# Patient Record
Sex: Female | Born: 1965 | Race: Black or African American | Hispanic: Yes | Marital: Single | Smoking: Never smoker
Health system: Southern US, Community
[De-identification: ages and names within clinical notes are randomized; demographics above are authoritative.]

## PROBLEM LIST (undated history)

## (undated) DIAGNOSIS — I10 Essential (primary) hypertension: Secondary | ICD-10-CM

## (undated) DIAGNOSIS — J302 Other seasonal allergic rhinitis: Secondary | ICD-10-CM

## (undated) DIAGNOSIS — G894 Chronic pain syndrome: Secondary | ICD-10-CM

## (undated) DIAGNOSIS — E039 Hypothyroidism, unspecified: Secondary | ICD-10-CM

## (undated) DIAGNOSIS — Z9989 Dependence on other enabling machines and devices: Secondary | ICD-10-CM

## (undated) DIAGNOSIS — N760 Acute vaginitis: Secondary | ICD-10-CM

## (undated) DIAGNOSIS — G4730 Sleep apnea, unspecified: Secondary | ICD-10-CM

## (undated) DIAGNOSIS — J984 Other disorders of lung: Secondary | ICD-10-CM

## (undated) DIAGNOSIS — N2889 Other specified disorders of kidney and ureter: Secondary | ICD-10-CM

## (undated) DIAGNOSIS — E669 Obesity, unspecified: Secondary | ICD-10-CM

## (undated) DIAGNOSIS — D649 Anemia, unspecified: Secondary | ICD-10-CM

## (undated) DIAGNOSIS — F32A Depression, unspecified: Secondary | ICD-10-CM

## (undated) DIAGNOSIS — F329 Major depressive disorder, single episode, unspecified: Secondary | ICD-10-CM

## (undated) DIAGNOSIS — R400 Somnolence: Secondary | ICD-10-CM

## (undated) DIAGNOSIS — M797 Fibromyalgia: Secondary | ICD-10-CM

## (undated) DIAGNOSIS — K219 Gastro-esophageal reflux disease without esophagitis: Secondary | ICD-10-CM

## (undated) DIAGNOSIS — G589 Mononeuropathy, unspecified: Secondary | ICD-10-CM

## (undated) DIAGNOSIS — D573 Sickle-cell trait: Secondary | ICD-10-CM

## (undated) DIAGNOSIS — J45909 Unspecified asthma, uncomplicated: Secondary | ICD-10-CM

## (undated) DIAGNOSIS — F419 Anxiety disorder, unspecified: Secondary | ICD-10-CM

## (undated) DIAGNOSIS — I2510 Atherosclerotic heart disease of native coronary artery without angina pectoris: Secondary | ICD-10-CM

## (undated) DIAGNOSIS — I2089 Other forms of angina pectoris: Secondary | ICD-10-CM

## (undated) DIAGNOSIS — K529 Noninfective gastroenteritis and colitis, unspecified: Secondary | ICD-10-CM

## (undated) DIAGNOSIS — G4733 Obstructive sleep apnea (adult) (pediatric): Secondary | ICD-10-CM

## (undated) DIAGNOSIS — E119 Type 2 diabetes mellitus without complications: Secondary | ICD-10-CM

## (undated) DIAGNOSIS — F4310 Post-traumatic stress disorder, unspecified: Secondary | ICD-10-CM

## (undated) HISTORY — DX: Other seasonal allergic rhinitis: J30.2

## (undated) HISTORY — DX: Unspecified asthma, uncomplicated: J45.909

## (undated) HISTORY — PX: EYE SURGERY: SHX253

## (undated) HISTORY — DX: Other disorders of lung: J98.4

## (undated) HISTORY — DX: Acute vaginitis: N76.0

## (undated) HISTORY — DX: Other forms of angina pectoris: I20.89

## (undated) HISTORY — DX: Obstructive sleep apnea (adult) (pediatric): G47.33

## (undated) HISTORY — DX: Sickle-cell trait: D57.3

## (undated) HISTORY — PX: NO PAST SURGERIES: SHX2092

## (undated) HISTORY — DX: Chronic pain syndrome: G89.4

## (undated) HISTORY — DX: Type 2 diabetes mellitus without complications: E11.9

## (undated) HISTORY — DX: Mononeuropathy, unspecified: G58.9

## (undated) HISTORY — DX: Noninfective gastroenteritis and colitis, unspecified: K52.9

## (undated) HISTORY — DX: Other specified disorders of kidney and ureter: N28.89

## (undated) HISTORY — DX: Atherosclerotic heart disease of native coronary artery without angina pectoris: I25.10

## (undated) HISTORY — DX: Major depressive disorder, single episode, unspecified: F32.9

## (undated) HISTORY — DX: Dependence on other enabling machines and devices: Z99.89

## (undated) HISTORY — DX: Somnolence: R40.0

## (undated) HISTORY — PX: LOOP RECORDER IMPLANT: SHX5954

---
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
## 1986-03-19 HISTORY — PX: WISDOM TOOTH EXTRACTION: SHX21

## 2001-12-16 ENCOUNTER — Emergency Department (HOSPITAL_COMMUNITY): Admission: EM | Admit: 2001-12-16 | Discharge: 2001-12-17 | Payer: Self-pay | Admitting: *Deleted

## 2004-09-13 ENCOUNTER — Encounter: Admission: RE | Admit: 2004-09-13 | Discharge: 2004-09-13 | Payer: Self-pay | Admitting: Obstetrics & Gynecology

## 2004-09-14 ENCOUNTER — Ambulatory Visit (HOSPITAL_COMMUNITY): Admission: RE | Admit: 2004-09-14 | Discharge: 2004-09-14 | Payer: Self-pay | Admitting: Obstetrics & Gynecology

## 2007-07-07 ENCOUNTER — Ambulatory Visit (HOSPITAL_COMMUNITY): Admission: RE | Admit: 2007-07-07 | Discharge: 2007-07-07 | Payer: Self-pay | Admitting: Obstetrics & Gynecology

## 2007-08-12 ENCOUNTER — Inpatient Hospital Stay (HOSPITAL_COMMUNITY): Admission: RE | Admit: 2007-08-12 | Discharge: 2007-08-27 | Payer: Self-pay | Admitting: Psychiatry

## 2007-08-12 ENCOUNTER — Ambulatory Visit: Payer: Self-pay | Admitting: Psychiatry

## 2007-09-04 ENCOUNTER — Ambulatory Visit: Payer: Self-pay | Admitting: *Deleted

## 2007-12-02 ENCOUNTER — Emergency Department (HOSPITAL_COMMUNITY): Admission: EM | Admit: 2007-12-02 | Discharge: 2007-12-03 | Payer: Self-pay | Admitting: Emergency Medicine

## 2007-12-15 ENCOUNTER — Ambulatory Visit: Payer: Self-pay | Admitting: Internal Medicine

## 2008-01-01 ENCOUNTER — Ambulatory Visit: Payer: Self-pay | Admitting: Internal Medicine

## 2008-02-04 ENCOUNTER — Ambulatory Visit: Payer: Self-pay | Admitting: Internal Medicine

## 2008-03-18 ENCOUNTER — Ambulatory Visit: Payer: Self-pay | Admitting: Internal Medicine

## 2008-03-25 ENCOUNTER — Encounter: Admission: RE | Admit: 2008-03-25 | Discharge: 2008-04-19 | Payer: Self-pay | Admitting: Internal Medicine

## 2008-04-22 ENCOUNTER — Ambulatory Visit: Payer: Self-pay | Admitting: Internal Medicine

## 2008-05-18 ENCOUNTER — Ambulatory Visit: Payer: Self-pay | Admitting: *Deleted

## 2008-05-18 ENCOUNTER — Ambulatory Visit: Payer: Self-pay | Admitting: Family Medicine

## 2008-05-31 ENCOUNTER — Emergency Department (HOSPITAL_COMMUNITY): Admission: EM | Admit: 2008-05-31 | Discharge: 2008-05-31 | Payer: Self-pay | Admitting: Emergency Medicine

## 2008-06-29 ENCOUNTER — Emergency Department (HOSPITAL_COMMUNITY): Admission: EM | Admit: 2008-06-29 | Discharge: 2008-06-29 | Payer: Self-pay | Admitting: Emergency Medicine

## 2008-07-15 ENCOUNTER — Ambulatory Visit: Payer: Self-pay | Admitting: Internal Medicine

## 2008-07-22 ENCOUNTER — Ambulatory Visit: Payer: Self-pay | Admitting: Internal Medicine

## 2008-09-21 ENCOUNTER — Encounter: Admission: RE | Admit: 2008-09-21 | Discharge: 2008-12-08 | Payer: Self-pay | Admitting: Internal Medicine

## 2008-11-25 ENCOUNTER — Ambulatory Visit: Payer: Self-pay | Admitting: Internal Medicine

## 2008-11-25 LAB — CONVERTED CEMR LAB
ALT: 22 units/L (ref 0–35)
Alkaline Phosphatase: 80 units/L (ref 39–117)
Basophils Relative: 0 % (ref 0–1)
Calcium: 9.4 mg/dL (ref 8.4–10.5)
Chloride: 106 meq/L (ref 96–112)
Creatinine, Ser: 0.73 mg/dL (ref 0.40–1.20)
Glucose, Bld: 95 mg/dL (ref 70–99)
HCT: 35.9 % — ABNORMAL LOW (ref 36.0–46.0)
Hemoglobin: 11.7 g/dL — ABNORMAL LOW (ref 12.0–15.0)
Lymphocytes Relative: 38 % (ref 12–46)
MCHC: 32.6 g/dL (ref 30.0–36.0)
MCV: 83.5 fL (ref 78.0–100.0)
Neutro Abs: 3.2 10*3/uL (ref 1.7–7.7)
Neutrophils Relative %: 47 % (ref 43–77)
Potassium: 4.2 meq/L (ref 3.5–5.3)
RBC: 4.3 M/uL (ref 3.87–5.11)
RDW: 16.3 % — ABNORMAL HIGH (ref 11.5–15.5)
Sodium: 140 meq/L (ref 135–145)

## 2009-01-07 ENCOUNTER — Ambulatory Visit (HOSPITAL_COMMUNITY): Admission: RE | Admit: 2009-01-07 | Discharge: 2009-01-07 | Payer: Self-pay | Admitting: Internal Medicine

## 2009-01-12 ENCOUNTER — Ambulatory Visit: Payer: Self-pay | Admitting: Internal Medicine

## 2009-01-12 LAB — CONVERTED CEMR LAB
ALT: 26 units/L (ref 0–35)
Albumin: 4.1 g/dL (ref 3.5–5.2)
Alkaline Phosphatase: 96 units/L (ref 39–117)
BUN: 5 mg/dL — ABNORMAL LOW (ref 6–23)
Chloride: 105 meq/L (ref 96–112)
Free T4: 1.07 ng/dL (ref 0.80–1.80)
Glucose, Bld: 107 mg/dL — ABNORMAL HIGH (ref 70–99)
Total Bilirubin: 0.5 mg/dL (ref 0.3–1.2)
Total Protein: 7.7 g/dL (ref 6.0–8.3)

## 2009-01-27 ENCOUNTER — Ambulatory Visit: Payer: Self-pay | Admitting: Family Medicine

## 2009-02-22 ENCOUNTER — Ambulatory Visit: Payer: Self-pay | Admitting: Family Medicine

## 2009-05-02 ENCOUNTER — Ambulatory Visit: Payer: Self-pay | Admitting: Internal Medicine

## 2009-08-11 ENCOUNTER — Ambulatory Visit: Payer: Self-pay | Admitting: Internal Medicine

## 2010-02-01 ENCOUNTER — Encounter (INDEPENDENT_AMBULATORY_CARE_PROVIDER_SITE_OTHER): Payer: Self-pay | Admitting: Internal Medicine

## 2010-02-01 ENCOUNTER — Ambulatory Visit (HOSPITAL_COMMUNITY): Admission: RE | Admit: 2010-02-01 | Discharge: 2010-02-01 | Payer: Self-pay | Admitting: Internal Medicine

## 2010-02-01 LAB — CONVERTED CEMR LAB: Free T4: 0.98 ng/dL (ref 0.80–1.80)

## 2010-02-06 DIAGNOSIS — M545 Low back pain, unspecified: Secondary | ICD-10-CM | POA: Insufficient documentation

## 2010-05-20 ENCOUNTER — Inpatient Hospital Stay (INDEPENDENT_AMBULATORY_CARE_PROVIDER_SITE_OTHER)
Admission: RE | Admit: 2010-05-20 | Discharge: 2010-05-20 | Disposition: A | Discharge status: Home / Self Care | Payer: Medicare Other | Source: Ambulatory Visit | Attending: Family Medicine | Admitting: Family Medicine

## 2010-05-20 ENCOUNTER — Emergency Department (HOSPITAL_COMMUNITY)
Admission: EM | Admit: 2010-05-20 | Discharge: 2010-05-21 | Disposition: A | Discharge status: Home / Self Care | Payer: Medicare Other | Attending: Emergency Medicine | Admitting: Emergency Medicine

## 2010-05-20 DIAGNOSIS — R112 Nausea with vomiting, unspecified: Secondary | ICD-10-CM | POA: Insufficient documentation

## 2010-05-20 DIAGNOSIS — R509 Fever, unspecified: Secondary | ICD-10-CM | POA: Insufficient documentation

## 2010-05-20 DIAGNOSIS — G9332 Myalgic encephalomyelitis/chronic fatigue syndrome: Secondary | ICD-10-CM | POA: Insufficient documentation

## 2010-05-20 DIAGNOSIS — R5382 Chronic fatigue, unspecified: Secondary | ICD-10-CM | POA: Insufficient documentation

## 2010-05-20 DIAGNOSIS — G35 Multiple sclerosis: Secondary | ICD-10-CM | POA: Insufficient documentation

## 2010-05-20 DIAGNOSIS — E039 Hypothyroidism, unspecified: Secondary | ICD-10-CM | POA: Insufficient documentation

## 2010-05-20 DIAGNOSIS — E860 Dehydration: Secondary | ICD-10-CM | POA: Insufficient documentation

## 2010-05-20 DIAGNOSIS — Z79899 Other long term (current) drug therapy: Secondary | ICD-10-CM | POA: Insufficient documentation

## 2010-05-20 DIAGNOSIS — R42 Dizziness and giddiness: Secondary | ICD-10-CM

## 2010-05-20 DIAGNOSIS — I10 Essential (primary) hypertension: Secondary | ICD-10-CM

## 2010-05-20 DIAGNOSIS — K589 Irritable bowel syndrome without diarrhea: Secondary | ICD-10-CM | POA: Insufficient documentation

## 2010-05-20 DIAGNOSIS — R197 Diarrhea, unspecified: Secondary | ICD-10-CM | POA: Insufficient documentation

## 2010-05-20 LAB — URINALYSIS, ROUTINE W REFLEX MICROSCOPIC
Ketones, ur: >80 mg/dL — AB
Protein, ur: 30 mg/dL — AB
Specific Gravity, Urine: 1.021 (ref 1.005–1.030)

## 2010-05-20 LAB — DIFFERENTIAL
Eosinophils Absolute: 0 10*3/uL (ref 0.0–0.7)
Eosinophils Relative: 0 % (ref 0–5)
Lymphocytes Relative: 27 % (ref 12–46)
Lymphs Abs: 2.7 10*3/uL (ref 0.7–4.0)
Monocytes Absolute: 0.9 10*3/uL (ref 0.1–1.0)
Monocytes Relative: 9 % (ref 3–12)
Neutro Abs: 6.5 10*3/uL (ref 1.7–7.7)

## 2010-05-20 LAB — COMPREHENSIVE METABOLIC PANEL
ALT: 34 U/L (ref 0–35)
AST: 35 U/L (ref 0–37)
Albumin: 4.3 g/dL (ref 3.5–5.2)
Alkaline Phosphatase: 87 U/L (ref 39–117)
Calcium: 9.3 mg/dL (ref 8.4–10.5)
Creatinine, Ser: 0.74 mg/dL (ref 0.4–1.2)
GFR calc Af Amer: >60 mL/min (ref >60)
GFR calc non Af Amer: >60 mL/min (ref >60)
Glucose, Bld: 107 mg/dL — ABNORMAL HIGH (ref 70–99)
Total Protein: 8.9 g/dL — ABNORMAL HIGH (ref 6.0–8.3)

## 2010-05-20 LAB — CBC
HCT: 38.9 % (ref 36.0–46.0)
MCH: 27.1 pg (ref 26.0–34.0)

## 2010-05-20 LAB — URINE MICROSCOPIC-ADD ON

## 2010-05-20 LAB — LIPASE, BLOOD: Lipase: 19 U/L (ref 11–59)

## 2010-05-21 LAB — URINE CULTURE
Colony Count: >=100000
Culture  Setup Time: 201203032034

## 2010-06-29 LAB — POCT I-STAT, CHEM 8
Calcium, Ion: 1.2 mmol/L (ref 1.12–1.32)
Glucose, Bld: 96 mg/dL (ref 70–99)
HCT: 37 % (ref 36.0–46.0)
Potassium: 3.6 mEq/L (ref 3.5–5.1)
TCO2: 27 mmol/L (ref 0–100)
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
## 2010-11-23 DIAGNOSIS — E039 Hypothyroidism, unspecified: Secondary | ICD-10-CM | POA: Insufficient documentation

## 2010-12-13 LAB — URINALYSIS, ROUTINE W REFLEX MICROSCOPIC
Glucose, UA: NEGATIVE
Nitrite: NEGATIVE
Protein, ur: NEGATIVE
pH: 6.5

## 2010-12-13 LAB — COMPREHENSIVE METABOLIC PANEL
Albumin: 3.6
BUN: 7
CO2: 27
Calcium: 9.1
GFR calc non Af Amer: >60
Glucose, Bld: 91
Sodium: 140

## 2010-12-13 LAB — URINE MICROSCOPIC-ADD ON

## 2010-12-13 LAB — CBC
Hemoglobin: 11.3 — ABNORMAL LOW
RBC: 4.68
WBC: 7.1

## 2010-12-18 LAB — POCT I-STAT, CHEM 8
BUN: 7
Calcium, Ion: 1.17
Chloride: 104
Glucose, Bld: 96
HCT: 35 — ABNORMAL LOW
Potassium: 3.9

## 2010-12-18 LAB — URINALYSIS, ROUTINE W REFLEX MICROSCOPIC
Bilirubin Urine: NEGATIVE
Hgb urine dipstick: NEGATIVE
Nitrite: NEGATIVE
Protein, ur: NEGATIVE
Specific Gravity, Urine: 1.01
pH: 7.5

## 2010-12-18 LAB — DIFFERENTIAL
Basophils Absolute: 0.1
Basophils Relative: 1
Eosinophils Relative: 6 — ABNORMAL HIGH
Lymphocytes Relative: 28
Lymphs Abs: 2
Monocytes Relative: 13 — ABNORMAL HIGH
Neutro Abs: 3.8

## 2010-12-18 LAB — RAPID URINE DRUG SCREEN, HOSP PERFORMED
Cocaine: NOT DETECTED
Tetrahydrocannabinol: NOT DETECTED

## 2010-12-18 LAB — ETHANOL: Alcohol, Ethyl (B): <5

## 2010-12-18 LAB — CBC
HCT: 33.7 — ABNORMAL LOW
Hemoglobin: 11.2 — ABNORMAL LOW
RBC: 3.94
RDW: 21.7 — ABNORMAL HIGH
WBC: 7.2

## 2010-12-18 LAB — POCT PREGNANCY, URINE: Preg Test, Ur: NEGATIVE

## 2011-06-05 ENCOUNTER — Ambulatory Visit
Admission: RE | Admit: 2011-06-05 | Discharge: 2011-06-05 | Disposition: A | Discharge status: Home / Self Care | Payer: Medicare Other | Source: Ambulatory Visit | Attending: Family Medicine | Admitting: Family Medicine

## 2011-06-05 ENCOUNTER — Other Ambulatory Visit: Payer: Self-pay | Admitting: Family Medicine

## 2011-06-05 DIAGNOSIS — Z1231 Encounter for screening mammogram for malignant neoplasm of breast: Secondary | ICD-10-CM

## 2012-03-19 DIAGNOSIS — D573 Sickle-cell trait: Secondary | ICD-10-CM | POA: Insufficient documentation

## 2012-04-30 ENCOUNTER — Other Ambulatory Visit: Payer: Self-pay | Admitting: Physician Assistant

## 2012-04-30 DIAGNOSIS — Z1231 Encounter for screening mammogram for malignant neoplasm of breast: Secondary | ICD-10-CM

## 2012-06-04 ENCOUNTER — Encounter (HOSPITAL_COMMUNITY): Payer: Self-pay | Admitting: Emergency Medicine

## 2012-06-04 ENCOUNTER — Emergency Department (HOSPITAL_COMMUNITY)
Admission: EM | Admit: 2012-06-04 | Discharge: 2012-06-04 | Discharge status: Against Medical Advice | Payer: Medicare Other | Attending: Emergency Medicine | Admitting: Emergency Medicine

## 2012-06-04 DIAGNOSIS — I10 Essential (primary) hypertension: Secondary | ICD-10-CM | POA: Insufficient documentation

## 2012-06-04 HISTORY — DX: Anxiety disorder, unspecified: F41.9

## 2012-06-04 HISTORY — DX: Essential (primary) hypertension: I10

## 2012-06-04 HISTORY — DX: Fibromyalgia: M79.7

## 2012-06-04 HISTORY — DX: Depression, unspecified: F32.A

## 2012-06-04 HISTORY — DX: Hypothyroidism, unspecified: E03.9

## 2012-06-04 HISTORY — DX: Post-traumatic stress disorder, unspecified: F43.10

## 2012-06-04 HISTORY — DX: Major depressive disorder, single episode, unspecified: F32.9

## 2012-06-04 HISTORY — DX: Sleep apnea, unspecified: G47.30

## 2012-06-04 HISTORY — DX: Obesity, unspecified: E66.9

## 2012-06-04 HISTORY — DX: Anemia, unspecified: D64.9

## 2012-06-04 HISTORY — DX: Gastro-esophageal reflux disease without esophagitis: K21.9

## 2012-06-04 NOTE — ED Notes (Signed)
Pt states, "I am going to go home. If I need to come back I will." pt educated on the risks of leaving without evaluation, pt verbalizes understanding

## 2012-06-04 NOTE — ED Notes (Signed)
C/o high blood pressure (pt reports 285/180) at home and headache all day.  States she forgot to take any of her medication today (including htn meds) until her headache got worse around 6pm.

## 2012-06-05 ENCOUNTER — Ambulatory Visit
Admission: RE | Admit: 2012-06-05 | Discharge: 2012-06-05 | Disposition: A | Discharge status: Home / Self Care | Payer: Medicare Other | Source: Ambulatory Visit | Attending: Physician Assistant | Admitting: Physician Assistant

## 2012-06-05 DIAGNOSIS — Z1231 Encounter for screening mammogram for malignant neoplasm of breast: Secondary | ICD-10-CM

## 2012-06-14 ENCOUNTER — Encounter (HOSPITAL_COMMUNITY): Payer: Self-pay | Admitting: Emergency Medicine

## 2012-06-14 ENCOUNTER — Emergency Department (INDEPENDENT_AMBULATORY_CARE_PROVIDER_SITE_OTHER)
Admission: EM | Admit: 2012-06-14 | Discharge: 2012-06-14 | Disposition: A | Discharge status: Home / Self Care | Payer: Medicare Other | Source: Home / Self Care

## 2012-06-14 DIAGNOSIS — J020 Streptococcal pharyngitis: Secondary | ICD-10-CM

## 2012-06-14 MED ORDER — AMOXICILLIN-POT CLAVULANATE 875-125 MG PO TABS: 1.0000 | ORAL_TABLET | Freq: Two times a day (BID) | ORAL | Status: DC

## 2012-06-14 MED ORDER — BENZONATATE 100 MG PO CAPS: 100.0000 mg | ORAL_CAPSULE | Freq: Two times a day (BID) | ORAL | Status: DC | PRN

## 2012-06-14 NOTE — ED Notes (Signed)
Pt c/o sore throat since Thursday. Sinus pressure. Wet productive cough and fever.  Pt has tried resting and not talking with no relief. Pt denies any other symptoms.

## 2012-06-14 NOTE — ED Provider Notes (Signed)
History     CSN: [id number]  Arrival date & time 06/14/12  1325   First MD Initiated Contact with Patient 06/14/12 1357      Chief complaint: Sore throat and cough   HPI Patient is a 46 year old female coming in with a five-day history of sore throat, cough, postnasal drip, and low-grade fever. Patient states that it seems to be getting worse. Patient states that the worst part of the this sore throat. Patient describes the pain as more of a dull aching sensation with burning. Patient states that it is worse with swallowing. Patient has had positive sick contacts including strep. Patient states she has had a cough that is productive with greenish discharge. Patient does have a history of asthma and has significant comorbidities. Patient denies any shortness of breath. Past Medical History  Diagnosis Date  . Hypertension   . Obesity   . Depression   . Anxiety   . PTSD (post-traumatic stress disorder)   . Hypothyroidism   . Anemia   . Fibromyalgia   . Sleep apnea   . GERD (gastroesophageal reflux disease)     No past surgical history on file.  No family history on file.  History  Substance Use Topics  . Smoking status: Never Smoker   . Smokeless tobacco: Not on file  . Alcohol Use: No    OB History   Grav Para Term Preterm Abortions TAB SAB Ect Mult Living                  Review of Systems Patient denies shortness of breath but is using her inhaler, positive for low-grade fever, denies nausea vomiting or abdominal pain, denies dysuria Allergies  Review of patient's allergies indicates no known allergies.  Home Medications   Current Outpatient Rx  Name  Route  Sig  Dispense  Refill  . amoxicillin-clavulanate (AUGMENTIN) 875-125 MG per tablet   Oral   Take 1 tablet by mouth 2 (two) times daily.   20 tablet   0   . benzonatate (TESSALON PERLES) 100 MG capsule   Oral   Take 1 capsule (100 mg total) by mouth 2 (two) times daily as needed for cough.   20  capsule   0     BP 153/81  Pulse 115  Temp(Src) 100 F (37.8 C) (Oral)  Resp 20  SpO2 100%  LMP 04/02/2012  Physical Exam General: No apparent distress the patient does appear uncomfortable. Patient does talk with a hoarse voice HEENT: Pupils equal reactive to light and accommodation, extraocular movements intact, patient is positive for postnasal drip as well as posterior pharynx erythema with mild exudate. Patient does have positive anterior cervical lymphadenopathy. Tympanic membranes visualized bilaterally nonbulging on erythemic. Pulmonary: Clear to auscultation bilaterally, no wheezes, rales or rhonchi. Cardiovascular: Patient is mildly tachycardic regular rhythm with no murmur present. Abdomen: Bowel sounds positive nontender nondistended Skin: No rash noted. ED Course  Procedures   Labs Reviewed - No data to display No results found. Strep test negative  Assessment  1. Strep throat     Plan: Patient does appear to have strep throat but does also have a cough secondary to her postnasal drip. Due to the patient's comorbidities and the multitude of other medications she is on, as well as sick contacts I would like to treat patient for presumed strep throat. Patient was going to be given Augmentin, Tessalon Perles and given handouts affording signs and went to seek medical attention. The patient will  followup with her primary care provider next week.  MDM          Judi Saa, DO 06/14/12 1446

## 2012-06-17 NOTE — ED Provider Notes (Signed)
Medical screening examination/treatment/procedure(s) were performed by resident physician or non-physician practitioner and as supervising physician I was immediately available for consultation/collaboration.   Barkley Bruns MD.   Linna Hoff, MD 06/17/12 2051

## 2012-08-14 DIAGNOSIS — R911 Solitary pulmonary nodule: Secondary | ICD-10-CM | POA: Insufficient documentation

## 2012-12-25 DIAGNOSIS — M170 Bilateral primary osteoarthritis of knee: Secondary | ICD-10-CM | POA: Insufficient documentation

## 2013-01-07 ENCOUNTER — Encounter: Payer: Self-pay | Admitting: Neurology

## 2013-01-07 ENCOUNTER — Ambulatory Visit (INDEPENDENT_AMBULATORY_CARE_PROVIDER_SITE_OTHER): Payer: Medicare HMO | Admitting: Neurology

## 2013-01-07 VITALS — BP 134/85 | HR 92 | Temp 99.0°F | Ht 63.5 in | Wt 266.0 lb

## 2013-01-07 DIAGNOSIS — G4710 Hypersomnia, unspecified: Secondary | ICD-10-CM

## 2013-01-07 DIAGNOSIS — F330 Major depressive disorder, recurrent, mild: Secondary | ICD-10-CM | POA: Insufficient documentation

## 2013-01-07 DIAGNOSIS — IMO0001 Reserved for inherently not codable concepts without codable children: Secondary | ICD-10-CM

## 2013-01-07 DIAGNOSIS — R400 Somnolence: Secondary | ICD-10-CM | POA: Insufficient documentation

## 2013-01-07 DIAGNOSIS — F339 Major depressive disorder, recurrent, unspecified: Secondary | ICD-10-CM

## 2013-01-07 DIAGNOSIS — M797 Fibromyalgia: Secondary | ICD-10-CM

## 2013-01-07 DIAGNOSIS — G4733 Obstructive sleep apnea (adult) (pediatric): Secondary | ICD-10-CM | POA: Insufficient documentation

## 2013-01-07 DIAGNOSIS — F4310 Post-traumatic stress disorder, unspecified: Secondary | ICD-10-CM | POA: Insufficient documentation

## 2013-01-07 DIAGNOSIS — F329 Major depressive disorder, single episode, unspecified: Secondary | ICD-10-CM | POA: Insufficient documentation

## 2013-01-07 HISTORY — DX: Major depressive disorder, single episode, unspecified: F32.9

## 2013-01-07 HISTORY — DX: Obstructive sleep apnea (adult) (pediatric): G47.33

## 2013-01-07 HISTORY — DX: Somnolence: R40.0

## 2013-01-07 NOTE — Progress Notes (Signed)
Subjective:    Patient ID: Abigail Mendez is a 47 y.o. female.  HPI  Huston Foley, MD, PhD [facility] [address], [address] [address] [place], [place] [address]  Dear Dr. Cyndia Bent,   I saw your patient, informed to call for, upon your kind request in my neurologic clinic today for initial consultation of her sleep disorder, in particular her obstructive sleep apnea. The patient is unaccompanied today. As you know, [name] is a very pleasant 47 year old right-handed woman with an underlying medical history of hypertension, obesity, FMS with chronic pain, MDD, PTSD, asthma, sickle cell trait, chronic fatigue, IBS, OA, GERD, CTS, allergies, who was Dx with OSA in 2/14. I do not have her sleep study report, but will try to get it. She no longer sees Dr. Randolm Idol in sleep out of [facility], as her insurance coverage has changed on 10/1. Her DME company has changed as well. [facility] to [facility] with [facility].  She was placed on CPAP in 2/14 and I reviewed the patient's CPAP compliance data from 10/09/12 to 01/06/13, which is a total of 90 days, during which time the patient used CPAP every day except for 2 days. The average usage for all days was 8 hours and 6 minutes. The percent used days greater than 4 hours was 93%, indicating excellent compliance. The residual AHI was 0.4 per hour, indicating an appropriate treatment pressure of 12 cwp with EPR of 3. While she indicates sleeping better with CPAP, she still wakes up not well rested and feels tired during the day. She has an ESS of 16/24. She has tried improving her sleep hygiene. She had gained wt on Lyrica and stopped it 7 months ago. She also stopped her Wellbutrin, Zoloft, Abilify about 10 months ago on her own. She has been taking hydrocodone 2 pills every 8 hours and robaxin for FMS. She has not yet seen a pain specialist, but is in the process of getting a referral. She sees Dr. Corliss Skains in  rheumatology. She used to go to [facility], but stopped going there on 12/25/12.   Her typical bedtime is reported to be around 9s AM and usual wake time is around 5:30 AM. Sleep onset typically occurs within 60 minutes. She reports feeling marginally rested upon awakening. She wakes up on an average 4 times in the middle of the night and has to go to the bathroom 1 time on a typical night. She admits to occasional morning headaches. She has not fallen asleep while driving. The patient has not been taking a planned nap.  She has been known to snore for the past many years. There is no report of nighttime reflux, with frequent nighttime cough experienced. The patient has not noted any RLS symptoms and is not known to kick while asleep or before falling asleep. There is no family history of RLS or OSA.  She is a restless sleeper and in the morning, the bed is quite disheveled.   She denies cataplexy, sleep paralysis, hypnagogic or hypnopompic hallucinations, or sleep attacks. She does not report any vivid dreams, nightmares, dream enactments, or parasomnias, such as sleep talking or sleep walking. She consumes 0 caffeinated beverages per day. She does not smoke and does not drink EtOH. She has had suicidal thoughts in the past and goes to group therapy, but has not been there in a month.  Her bedroom is usually dark and cool. There is a TV in the  bedroom and usually it is not on at night.   Her Past Medical History Is Significant For: Past Medical History  Diagnosis Date  . Hypertension   . Obesity   . Depression   . Anxiety   . PTSD (post-traumatic stress disorder)   . Hypothyroidism   . Anemia   . Fibromyalgia   . Sleep apnea   . GERD (gastroesophageal reflux disease)     Her Past Surgical History Is Significant For: No past surgical history on file.  Her Family History Is Significant For: No family history on file.  Her Social History Is Significant For: History   Social  History  . Marital Status: Single    Spouse Name: N/A    Number of Children: N/A  . Years of Education: N/A   Social History Main Topics  . Smoking status: Never Smoker   . Smokeless tobacco: None  . Alcohol Use: No  . Drug Use: No  . Sexual Activity: No   Other Topics Concern  . None   Social History Narrative  . None    Her Allergies Are:  Allergies  Allergen Reactions  . Benztropine Nausea And Vomiting  . Cyclobenzaprine     Hallucinations  . Lactose Intolerance (Gi) Diarrhea  . Lamotrigine     'fall', balance problems  . Latex     Redness,swelling,itching, localized pain  . Lisinopril     cough  . Phenylalanine Rash  :   Her Current Medications Are:  Outpatient Encounter Prescriptions as of 01/07/2013  Medication Sig Dispense Refill  . acetaminophen (TYLENOL) 500 MG tablet 1,000 mg.      . albuterol (PROVENTIL HFA;VENTOLIN HFA) 108 (90 BASE) MCG/ACT inhaler Inhale 2 puffs into the lungs 4 (four) times daily.      Marland Kitchen AMLODIPINE BESYLATE PO Take by mouth.      . ARIPiprazole (ABILIFY PO) Take by mouth.      Marland Kitchen aspirin 81 MG tablet Take 81 mg by mouth daily.      Marland Kitchen b complex vitamins tablet Take 1 tablet by mouth daily.      Marland Kitchen buPROPion (WELLBUTRIN XL) 300 MG 24 hr tablet Take 300 mg by mouth daily.      . Cholecalciferol (VITAMIN D3) 1000 UNITS CAPS Take 2 tablets by mouth daily.      . cloNIDine (CATAPRES) 0.1 MG tablet Take 1 tablet by mouth 2 (two) times daily.      . ferrous sulfate 325 (65 FE) MG tablet Take 5 mg by mouth daily.      . fexofenadine (ALLEGRA) 180 MG tablet Take 180 mg by mouth daily.      . furosemide (LASIX) 20 MG tablet Take 1 tablet by mouth 2 (two) times daily.      . Garlic 2000 MG CAPS Take 5 tablets by mouth daily.      . Hydrocodone-Acetaminophen (VICODIN) 5-300 MG TABS Take 1 tablet by mouth every 8 (eight) hours as needed.      Marland Kitchen levothyroxine (SYNTHROID, LEVOTHROID) 50 MCG tablet Take 50 mcg by mouth daily.      . methocarbamol  (ROBAXIN) 500 MG tablet Take 500 mg by mouth 3 (three) times daily.      . mometasone-formoterol (DULERA) 200-5 MCG/ACT AERO Inhale 2 puffs into the lungs 2 (two) times daily.      . montelukast (SINGULAIR) 10 MG tablet Take 1 tablet by mouth daily.      . Multiple Vitamins-Minerals (THERA-M) TABS Take 1 tablet  by mouth daily.      Marland Kitchen omeprazole (PRILOSEC) 40 MG capsule Take 40 mg by mouth daily.      . valsartan (DIOVAN) 80 MG tablet Take 1 tablet by mouth daily.      . [DISCONTINUED] amLODipine (NORVASC) 10 MG tablet Take 10 mg by mouth daily.      . [DISCONTINUED] aspirin 81 MG tablet Take 81 mg by mouth daily.      . [DISCONTINUED] cloNIDine (CATAPRES) 0.1 MG tablet Take 1 tablet by mouth 2 (two) times daily.      . [DISCONTINUED] montelukast (SINGULAIR) 10 MG tablet Take 1 tablet by mouth daily.      . [DISCONTINUED] sertraline (ZOLOFT) 100 MG tablet Take 1 tablet by mouth 2 (two) times daily.      . [DISCONTINUED] ALBUTEROL SULFATE ER PO Take by mouth.      . [DISCONTINUED] amoxicillin-clavulanate (AUGMENTIN) 875-125 MG per tablet Take 1 tablet by mouth 2 (two) times daily.  20 tablet  0  . [DISCONTINUED] ARIPiprazole (ABILIFY) 5 MG tablet Take 15 mg by mouth daily.      . [DISCONTINUED] benzonatate (TESSALON PERLES) 100 MG capsule Take 1 capsule (100 mg total) by mouth 2 (two) times daily as needed for cough.  20 capsule  0  . [DISCONTINUED] BuPROPion HCl (WELLBUTRIN SR PO) Take by mouth.      . [DISCONTINUED] Fexofenadine HCl (ALLEGRA ODT PO) Take by mouth.      . [DISCONTINUED] Furosemide (LASIX PO) Take by mouth.      . [DISCONTINUED] Hydrocodone-Acetaminophen (VICODIN PO) Take by mouth.      . [DISCONTINUED] Levothyroxine Sodium 88 MCG CAPS Take by mouth.      . [DISCONTINUED] Methocarbamol (ROBAXIN PO) Take by mouth.      . [DISCONTINUED] Omeprazole (PRILOSEC PO) Take by mouth.      . [DISCONTINUED] Pregabalin (LYRICA PO) Take by mouth.      . [DISCONTINUED] Sertraline HCl (ZOLOFT  PO) Take by mouth.      . [DISCONTINUED] Valsartan (DIOVAN PO) Take by mouth.       No facility-administered encounter medications on file as of 01/07/2013.  :  Review of Systems:  Out of a complete 14 point review of systems, all are reviewed and negative with the exception of these symptoms as listed below:  Review of Systems  Constitutional: Positive for activity change, appetite change and fatigue.  HENT: Positive for hearing loss and trouble swallowing.   Eyes: Positive for pain.  Respiratory: Positive for shortness of breath and wheezing.        Snoring  Cardiovascular: Positive for chest pain.  Gastrointestinal: Negative.   Endocrine: Positive for heat intolerance.       Flushing  Genitourinary: Negative.   Musculoskeletal: Positive for arthralgias, joint swelling and myalgias.       Cramps  Skin: Negative.   Allergic/Immunologic: Positive for environmental allergies and immunocompromised state.  Neurological: Positive for dizziness, weakness, numbness and headaches.  Hematological:       Anemia  Psychiatric/Behavioral: Positive for suicidal ideas, sleep disturbance and dysphoric mood. The patient is nervous/anxious.     Objective:  Neurologic Exam  Physical Exam Physical Examination:   Filed Vitals:   01/07/13 0850  BP: 134/85  Pulse: 92  Temp: 99 F (37.2 C)    General Examination: The patient is a very pleasant 47 y.o. female in no acute distress. She appears well-developed and well-nourished and well groomed. She is obese.   HEENT:  Normocephalic, atraumatic, pupils are equal, round and reactive to light and accommodation. Funduscopic exam is normal with sharp disc margins noted. Extraocular tracking is good without limitation to gaze excursion or nystagmus noted. Normal smooth pursuit is noted. Hearing is grossly intact. Tympanic membranes are clear bilaterally. Face is symmetric with normal facial animation and normal facial sensation. Speech is clear with no  dysarthria noted. There is no hypophonia. There is no lip, neck/head, jaw or voice tremor. Neck is supple with full range of passive and active motion. There are no carotid bruits on auscultation. Oropharynx exam reveals: mild mouth dryness, adequate dental hygiene and moderate airway crowding, due to narrow airway, tonsils of 2+ and larger tongue. Mallampati is class III. Tongue protrudes centrally and palate elevates symmetrically. Neck size is 18 inches.   Chest: Clear to auscultation without wheezing, rhonchi or crackles noted.  Heart: S1+S2+0, regular and normal without murmurs, rubs or gallops noted.   Abdomen: Soft, non-tender and non-distended with normal bowel sounds appreciated on auscultation.  Extremities: There is 1+ pitting edema in the distal lower extremities bilaterally. Pedal pulses are intact.  Skin: Warm and dry without trophic changes noted. There are no varicose veins.  Musculoskeletal: exam reveals no obvious joint deformities, tenderness or joint swelling or erythema.   Neurologically:  Mental status: The patient is awake, alert and oriented in all 4 spheres. Her memory, attention, language and knowledge are appropriate. There is no aphasia, agnosia, apraxia or anomia. Speech is clear with normal prosody and enunciation. Thought process is linear. Mood is depressed and affect is blunted.  Cranial nerves are as described above under HEENT exam. In addition, shoulder shrug is normal with equal shoulder height noted. Motor exam: Normal bulk, strength and tone is noted. There is no drift, tremor or rebound. Romberg is negative. Reflexes are 2+ throughout. Toes are downgoing bilaterally. Fine motor skills are intact with normal finger taps, normal hand movements, normal rapid alternating patting, normal foot taps and normal foot agility.  Cerebellar testing shows no dysmetria or intention tremor on finger to nose testing. Heel to shin is unremarkable bilaterally. There is no  truncal or gait ataxia.  Sensory exam is intact to light touch, pinprick, vibration, temperature sense and proprioception in the upper and lower extremities.  Gait, station and balance are unremarkable. No veering to one side is noted. No leaning to one side is noted. Posture is age-appropriate and stance is narrow based. No problems turning are noted. She turns in 3 steps. Tandem walk is unremarkable. Intact toe and heel stance is noted.               Assessment and Plan:   In summary, QUANASIA DEFINO is a very pleasant 47 y.o.-year old female with a complex underlying medical history and a recent diagnosis of obstructive sleep apnea, now on CPAP. Her compliance data indicates that she is fully compliant with therapy. Unfortunately she has residual daytime symptoms including nonrestorative sleep and sleepiness. I do feel that her untreated mood disorder has a lot to do with her residual symptoms. Her compliance data indicates that she sleeps on an average 8 hours per night every night. She however indicates that she sleeps very poorly and there may be a component of sleep state misconception even. Unfortunately at this juncture I do not have her sleep study report available for review. We will try to get this today. Also, she indicates that her current DME company is no longer in network and that  she also got a letter from [facility] indicating that they would not be paying for the CPAP machine or supplies. I am not quite sure what this is all about. Nevertheless, at this juncture I have entered a new CPAP order for her so we can send it to a DME company that is in her network. She is going to call us back with that information and we will also try her best to find out what we need to do. In the worst case scenario we may have to repeat her sleep study and titration study were at least a split-night study. I advised her of this and she would be willing to go through with all of this even though she would be  reluctant to go through with another sleep study especially if she is well treated currently. I have strongly suggested that she get in touch with you about a referral to another psychiatry practice. She is strongly discouraged from stopping medications on her own. She stopped all her psychiatry medications several months ago. She has had suicidal ideations intermittently but no current plan or a history of carrying out any suicidal plans. She would like to stay with a psychiatrist locally to where she lives. In addition she is advised to followup with her rheumatologist and with you as scheduled. We talked about OSA, its prognosis and treatment options and in particular I advised her about the risks and ramifications of untreated OSA, in particular with respect to cardiovascular disease. She is congratulated on her excellent compliance with CPAP treatment and encouraged to continue with this. We also talked about trying to maintaining a healthy lifestyle in general. I encouraged the patient to eat healthy, exercise daily and keep well hydrated, to keep a scheduled bedtime and wake time routine, to not skip any meals and eat healthy snacks in between meals and to have protein with every meal. I stressed the importance of regular exercise.   I answered all her questions today and the patient was in agreement with the above outlined plan. I would like to see the patient back in 3 months, sooner if the need arises and encouraged her to call with any interim questions, concerns, problems or updates.    Most of my 50 minute visit today was spent in counseling and coordination of care, reviewing test results and reviewing medications.  Thank you very much for allowing me to participate in the care of this nice patient. If I can be of any further assistance to you please do not hesitate to call me at [phone].  Sincerely,   Huston Foley, MD, PhD

## 2013-01-07 NOTE — Patient Instructions (Signed)
Please continue using your CPAP regularly. While your insurance requires that you use CPAP at least 4 hours each night on 70% of the nights, I recommend, that you not skip any nights and use it throughout the night if you can. Getting used to CPAP does take time and patience and discipline. Untreated obstructive sleep apnea when it is moderate to severe can have an adverse impact on cardiovascular health and raise her risk for heart disease, arrhythmias, hypertension, congestive heart failure, stroke and diabetes. Untreated obstructive sleep apnea causes sleep disruption, nonrestorative sleep, and sleep deprivation. This can have an impact on your day to day functioning and cause daytime sleepiness and impairment of cognitive function, memory loss, mood disturbance, and problems focussing. Using CPAP regularly can improve these symptoms.  I have entered a new order for a CPAP machine.

## 2013-01-08 ENCOUNTER — Encounter: Payer: Self-pay | Admitting: Neurology

## 2013-01-14 ENCOUNTER — Ambulatory Visit (HOSPITAL_COMMUNITY): Payer: Medicare Other | Admitting: Psychiatry

## 2013-01-16 ENCOUNTER — Encounter: Payer: Self-pay | Admitting: Neurology

## 2013-01-16 ENCOUNTER — Ambulatory Visit (INDEPENDENT_AMBULATORY_CARE_PROVIDER_SITE_OTHER): Payer: Medicare HMO | Admitting: Licensed Clinical Social Worker

## 2013-01-16 DIAGNOSIS — F331 Major depressive disorder, recurrent, moderate: Secondary | ICD-10-CM

## 2013-01-16 DIAGNOSIS — F411 Generalized anxiety disorder: Secondary | ICD-10-CM

## 2013-01-21 ENCOUNTER — Telehealth: Payer: Self-pay | Admitting: Neurology

## 2013-01-21 NOTE — Telephone Encounter (Signed)
Patient called wanting her CPAP order to go to [place]. I will send order to [facility].

## 2013-01-22 ENCOUNTER — Ambulatory Visit (INDEPENDENT_AMBULATORY_CARE_PROVIDER_SITE_OTHER): Payer: Medicare HMO | Admitting: Licensed Clinical Social Worker

## 2013-01-22 DIAGNOSIS — F3189 Other bipolar disorder: Secondary | ICD-10-CM

## 2013-01-27 ENCOUNTER — Ambulatory Visit (INDEPENDENT_AMBULATORY_CARE_PROVIDER_SITE_OTHER): Payer: Medicare HMO | Admitting: Psychiatry

## 2013-01-27 VITALS — BP 124/64 | HR 73 | Ht 63.75 in | Wt 262.4 lb

## 2013-01-27 DIAGNOSIS — F329 Major depressive disorder, single episode, unspecified: Secondary | ICD-10-CM

## 2013-01-27 DIAGNOSIS — F332 Major depressive disorder, recurrent severe without psychotic features: Secondary | ICD-10-CM

## 2013-01-27 DIAGNOSIS — F411 Generalized anxiety disorder: Secondary | ICD-10-CM

## 2013-01-27 DIAGNOSIS — F4310 Post-traumatic stress disorder, unspecified: Secondary | ICD-10-CM
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
## 2013-01-29 ENCOUNTER — Ambulatory Visit (HOSPITAL_COMMUNITY): Payer: Medicare Other | Admitting: Psychiatry

## 2013-02-06 ENCOUNTER — Encounter: Payer: Self-pay | Admitting: Family

## 2013-02-06 ENCOUNTER — Ambulatory Visit (INDEPENDENT_AMBULATORY_CARE_PROVIDER_SITE_OTHER): Payer: Medicare HMO | Admitting: Family

## 2013-02-06 VITALS — BP 124/68 | HR 87 | Wt 263.0 lb

## 2013-02-06 DIAGNOSIS — D500 Iron deficiency anemia secondary to blood loss (chronic): Secondary | ICD-10-CM

## 2013-02-06 DIAGNOSIS — G4733 Obstructive sleep apnea (adult) (pediatric): Secondary | ICD-10-CM

## 2013-02-06 DIAGNOSIS — R5382 Chronic fatigue, unspecified: Secondary | ICD-10-CM

## 2013-02-06 DIAGNOSIS — K219 Gastro-esophageal reflux disease without esophagitis: Secondary | ICD-10-CM

## 2013-02-06 DIAGNOSIS — IMO0001 Reserved for inherently not codable concepts without codable children: Secondary | ICD-10-CM

## 2013-02-06 DIAGNOSIS — M1990 Unspecified osteoarthritis, unspecified site: Secondary | ICD-10-CM | POA: Insufficient documentation

## 2013-02-06 DIAGNOSIS — G4700 Insomnia, unspecified: Secondary | ICD-10-CM

## 2013-02-06 DIAGNOSIS — R5381 Other malaise: Secondary | ICD-10-CM

## 2013-02-06 DIAGNOSIS — I10 Essential (primary) hypertension: Secondary | ICD-10-CM

## 2013-02-06 DIAGNOSIS — M797 Fibromyalgia: Secondary | ICD-10-CM

## 2013-02-06 MED ORDER — FUROSEMIDE 20 MG PO TABS: 20.0000 mg | ORAL_TABLET | Freq: Two times a day (BID) | ORAL | Status: DC

## 2013-02-06 MED ORDER — OXYCODONE-ACETAMINOPHEN 5-325 MG PO TABS: 1.0000 | ORAL_TABLET | Freq: Three times a day (TID) | ORAL | Status: DC | PRN

## 2013-02-06 NOTE — Patient Instructions (Signed)
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
## 2013-02-06 NOTE — Progress Notes (Signed)
Pre visit review using our clinic review tool, if applicable. No additional management support is needed unless otherwise documented below in the visit note. 

## 2013-02-09 ENCOUNTER — Ambulatory Visit (INDEPENDENT_AMBULATORY_CARE_PROVIDER_SITE_OTHER): Payer: Medicare HMO | Admitting: Licensed Clinical Social Worker

## 2013-02-09 DIAGNOSIS — F411 Generalized anxiety disorder: Secondary | ICD-10-CM

## 2013-02-09 DIAGNOSIS — F331 Major depressive disorder, recurrent, moderate: Secondary | ICD-10-CM

## 2013-02-10 ENCOUNTER — Ambulatory Visit (INDEPENDENT_AMBULATORY_CARE_PROVIDER_SITE_OTHER): Payer: Medicare HMO | Admitting: Psychiatry

## 2013-02-10 DIAGNOSIS — F329 Major depressive disorder, single episode, unspecified: Secondary | ICD-10-CM

## 2013-02-10 DIAGNOSIS — F4310 Post-traumatic stress disorder, unspecified: Secondary | ICD-10-CM

## 2013-02-10 MED ORDER — VENLAFAXINE HCL 37.5 MG PO TABS: 75.0000 mg | ORAL_TABLET | Freq: Every morning | ORAL | Status: DC

## 2013-02-10 NOTE — Progress Notes (Signed)
[facility] [id number] Progress Note  Abigail Mendez [id number] 47 y.o.  02/10/2013 11:45 AM  Chief Complaint: "depression not getting better"  History of Present Illness:Patient is a 47 yo AAF with long history of depression who was started on Effexor at 37.5mg . She reports tolerating the Effexor well, but states it has not improved her mood. Continues to feel depressed, has poor appetite. She is on a different muscle relaxant, states her pain is still not under control , 10 percent better compared to her last visit. Sleep has improved, patient was started on Ambien by another doctor, states this has been helpful. Patient had labwork done recently reports it was normal for Hemoglobin and is being treated for hypothyroidism.   Suicidal Ideation: No Plan Formed: No Patient has means to carry out plan: No  Homicidal Ideation: No Plan Formed: No Patient has means to carry out plan: No  Review of Systems: Psychiatric: Agitation: No Hallucination: No Depressed Mood: Yes Insomnia: No Hypersomnia: No Altered Concentration: Yes Feels Worthless: Yes Grandiose Ideas: No Belief In Special Powers: No New/Increased Substance Abuse: No Compulsions: No  Neurologic: Headache: Yes Seizure: No Paresthesias: No  Past Medical Family, Social History: Multiple medical conditions. Never married, currently on disability and lives alone.   Outpatient Encounter Prescriptions as of 02/10/2013  Medication Sig  . acetaminophen (TYLENOL) 500 MG tablet 1,000 mg.  . albuterol (PROVENTIL HFA;VENTOLIN HFA) 108 (90 BASE) MCG/ACT inhaler Inhale 2 puffs into the lungs 4 (four) times daily.  Marland Kitchen AMLODIPINE BESYLATE PO Take by mouth.  . ARIPiprazole (ABILIFY PO) Take by mouth.  Marland Kitchen aspirin 81 MG tablet Take 81 mg by mouth daily.  Marland Kitchen b complex vitamins tablet Take 1 tablet by mouth daily.  . baclofen (LIORESAL) 10 MG tablet   . buPROPion (WELLBUTRIN XL) 300 MG 24 hr tablet Take 300 mg by mouth daily.   . Cholecalciferol (VITAMIN D3) 1000 UNITS CAPS Take 2 tablets by mouth daily.  . cloNIDine (CATAPRES) 0.1 MG tablet Take 1 tablet by mouth 2 (two) times daily.  . ferrous sulfate 325 (65 FE) MG tablet Take 5 mg by mouth daily.  . fexofenadine (ALLEGRA) 180 MG tablet Take 180 mg by mouth daily.  . furosemide (LASIX) 20 MG tablet Take 1 tablet (20 mg total) by mouth 2 (two) times daily.  . Garlic 2000 MG CAPS Take 5 tablets by mouth daily.  . Hydrocodone-Acetaminophen (VICODIN) 5-300 MG TABS Take 1 tablet by mouth every 8 (eight) hours as needed.  Marland Kitchen levothyroxine (SYNTHROID, LEVOTHROID) 50 MCG tablet Take 50 mcg by mouth daily.  . methocarbamol (ROBAXIN) 500 MG tablet Take 500 mg by mouth 3 (three) times daily.  . mometasone-formoterol (DULERA) 200-5 MCG/ACT AERO Inhale 2 puffs into the lungs 2 (two) times daily.  . montelukast (SINGULAIR) 10 MG tablet Take 1 tablet by mouth daily.  . Multiple Vitamins-Minerals (THERA-M) TABS Take 1 tablet by mouth daily.  Marland Kitchen omeprazole (PRILOSEC) 40 MG capsule Take 40 mg by mouth daily.  Marland Kitchen oxyCODONE-acetaminophen (ROXICET) 5-325 MG per tablet Take 1 tablet by mouth every 8 (eight) hours as needed for severe pain.  . valsartan (DIOVAN) 80 MG tablet Take 1 tablet by mouth daily.  Marland Kitchen venlafaxine (EFFEXOR) 37.5 MG tablet   . zolpidem (AMBIEN) 10 MG tablet     Past Psychiatric History/Hospitalization(s): Anxiety: Yes Bipolar Disorder: No Depression: Yes Mania: No Psychosis: No Schizophrenia: No Personality Disorder: No Hospitalization for psychiatric illness: Yes History of Electroconvulsive Shock Therapy: No Prior Suicide  Attempts: Yes  Physical Exam: Constitutional:  BP- 130/76 mm hg WT: 260.00 lbs Height: 5\' 3"   General Appearance: obese  Musculoskeletal: Strength & Muscle Tone: decreased Gait & Station: unsteady, walks with a walking stick Patient leans: N/A  Psychiatric: Speech (describe rate, volume, coherence, spontaneity, and  abnormalities if any): Normal rate, soft  Thought Process (describe rate, content, abstract reasoning, and computation): Normal, obsesses about her worthlessness  Associations: Coherent  Thoughts: normal  Mental Status: Orientation: oriented to person, place, time/date and situation Mood & Affect: depressed affect and anxiety Attention Span & Concentration: Normal  Medical Decision Making (Choose Three): Established Problem, Stable/Improving (1), Review of Last Therapy Session (1), Review of Medication Regimen & Side Effects (2) and Review of New Medication or Change in Dosage (2)  Assessment: Axis I: MDD, PTSD  Axis II: deferred  Axis III: Fibromyalgia, Chronic fatigue syndrome  Axis IV: multiple medical illnesses, lack of support, lives alone  Axis V: 70   Plan: Increase Effexor to 75mg , patient made aware of side effects and benefits. Recommend minimizing narcotic pain medications. Continue to follow up with therapy appointments. RTC in 6-8 weeks.  Patrick North, MD 02/10/2013

## 2013-02-20 ENCOUNTER — Ambulatory Visit (INDEPENDENT_AMBULATORY_CARE_PROVIDER_SITE_OTHER): Payer: Medicare HMO | Admitting: Licensed Clinical Social Worker

## 2013-02-20 DIAGNOSIS — F411 Generalized anxiety disorder: Secondary | ICD-10-CM

## 2013-02-20 DIAGNOSIS — F331 Major depressive disorder, recurrent, moderate: Secondary | ICD-10-CM

## 2013-02-24 ENCOUNTER — Telehealth: Payer: Self-pay | Admitting: Family

## 2013-02-24 NOTE — Telephone Encounter (Signed)
Patient Information:  Caller Name: Thai  Phone: [phone]  Patient: Abigail Mendez, Abigail Mendez  Gender: Female  DOB: [birth year]  Age: 47 Years  PCP: Adline Mango [facility])  Pregnant: No  Office Follow Up:  Does the office need to follow up with this patient?: Yes  Instructions For The Office: Pt asking if she can take ibuprofen with Percocet 5/325 1 PO Q 8 hrs prn pain.   Symptoms  Reason For Call & Symptoms: She is treated for Chronic Pain and taking Percocet 5/325 1 every 8 hrs prn.  She is being evaluated for a pain clinic, but in the meantime may she take Ibuprofen?  Please call pt back with advice.   Reviewed Health History In EMR: Yes  Reviewed Medications In EMR: Yes  Reviewed Allergies In EMR: Yes  Reviewed Surgeries / Procedures: Yes  Date of Onset of Symptoms: 02/24/2013  Treatments Tried: Percocet 5/325  Treatments Tried Worked: No OB / GYN:  LMP: Unknown  Guideline(s) Used:  No Protocol Available - Sick Adult  Disposition Per Guideline:   Discuss with PCP and Callback by Nurse Today  Reason For Disposition Reached:   Nursing judgment  Advice Given:  Call Back If:  New symptoms develop  You become worse.  Patient Will Follow Care Advice:  YES

## 2013-02-24 NOTE — Telephone Encounter (Signed)
Ok to take Ibuprofen?

## 2013-02-24 NOTE — Telephone Encounter (Signed)
Pt aware.

## 2013-02-24 NOTE — Telephone Encounter (Signed)
Please advise 

## 2013-02-26 ENCOUNTER — Ambulatory Visit (INDEPENDENT_AMBULATORY_CARE_PROVIDER_SITE_OTHER): Payer: Medicare HMO | Admitting: Family

## 2013-02-26 ENCOUNTER — Encounter: Payer: Self-pay | Admitting: Family

## 2013-02-26 VITALS — BP 98/58 | HR 103 | Wt 263.0 lb

## 2013-02-26 DIAGNOSIS — M797 Fibromyalgia: Secondary | ICD-10-CM

## 2013-02-26 DIAGNOSIS — M1990 Unspecified osteoarthritis, unspecified site: Secondary | ICD-10-CM

## 2013-02-26 DIAGNOSIS — IMO0001 Reserved for inherently not codable concepts without codable children: Secondary | ICD-10-CM

## 2013-02-26 MED ORDER — OXYCODONE-ACETAMINOPHEN 10-325 MG PO TABS: 1.0000 | ORAL_TABLET | Freq: Three times a day (TID) | ORAL | Status: DC | PRN

## 2013-02-26 NOTE — Patient Instructions (Signed)

## 2013-02-26 NOTE — Progress Notes (Signed)
Pre visit review using our clinic review tool, if applicable. No additional management support is needed unless otherwise documented below in the visit note. 

## 2013-02-26 NOTE — Progress Notes (Signed)
Subjective:    Patient ID: Abigail Mendez, female    DOB: Jan 19, 1966, 47 y.o.   MRN: [id number]  HPI 47 year old [place] American female, is in today with uncontrolled fibromyalgia and osteoarthritis. Her last office visit with which her hydrocodone to Percocet. She has an appointment with the pain clinic on 03/05/2013. However, she still in a cruciate in pain. Reports the pain is 9.5 of 10, worse with movement and at bedtime. She has a long-standing history of osteoarthritis, depression, fibromyalgia. She is requesting a temporary prescription but increased pain medication dosage until she sees the pain clinic next week.   Review of Systems  Respiratory: Negative.   Cardiovascular: Negative.   Gastrointestinal: Negative.   Endocrine: Negative.   Musculoskeletal: Positive for arthralgias, back pain and myalgias.  Skin: Negative.   Allergic/Immunologic: Negative.   Neurological: Negative.   Psychiatric/Behavioral: Negative.    Past Medical History  Diagnosis Date  . Hypertension   . Obesity   . Depression   . Anxiety   . PTSD (post-traumatic stress disorder)   . Hypothyroidism   . Anemia   . Fibromyalgia   . Sleep apnea   . GERD (gastroesophageal reflux disease)   . OSA on CPAP 01/07/2013  . MDD (major depressive disorder) 01/07/2013  . Daytime somnolence 01/07/2013    History   Social History  . Marital Status: Single    Spouse Name: N/A    Number of Children: N/A  . Years of Education: N/A   Occupational History  . Not on file.   Social History Main Topics  . Smoking status: Never Smoker   . Smokeless tobacco: Not on file  . Alcohol Use: No  . Drug Use: No  . Sexual Activity: No   Other Topics Concern  . Not on file   Social History Narrative  . No narrative on file    No past surgical history on file.  No family history on file.  Allergies  Allergen Reactions  . Benztropine Nausea And Vomiting  . Cyclobenzaprine     Hallucinations  . Lactose  Intolerance (Gi) Diarrhea  . Lamotrigine     'fall', balance problems  . Latex     Redness,swelling,itching, localized pain  . Lisinopril     cough  . Phenylalanine Rash    Current Outpatient Prescriptions on File Prior to Visit  Medication Sig Dispense Refill  . acetaminophen (TYLENOL) 500 MG tablet 1,000 mg.      . albuterol (PROVENTIL HFA;VENTOLIN HFA) 108 (90 BASE) MCG/ACT inhaler Inhale 2 puffs into the lungs 4 (four) times daily.      Marland Kitchen AMLODIPINE BESYLATE PO Take by mouth.      . ARIPiprazole (ABILIFY PO) Take by mouth.      Marland Kitchen aspirin 81 MG tablet Take 81 mg by mouth daily.      Marland Kitchen b complex vitamins tablet Take 1 tablet by mouth daily.      . baclofen (LIORESAL) 10 MG tablet       . buPROPion (WELLBUTRIN XL) 300 MG 24 hr tablet Take 300 mg by mouth daily.      . Cholecalciferol (VITAMIN D3) 1000 UNITS CAPS Take 2 tablets by mouth daily.      . cloNIDine (CATAPRES) 0.1 MG tablet Take 1 tablet by mouth 2 (two) times daily.      . ferrous sulfate 325 (65 FE) MG tablet Take 5 mg by mouth daily.      . fexofenadine (ALLEGRA) 180 MG tablet  Take 180 mg by mouth daily.      . furosemide (LASIX) 20 MG tablet Take 1 tablet (20 mg total) by mouth 2 (two) times daily.  60 tablet  4  . Garlic 2000 MG CAPS Take 5 tablets by mouth daily.      Marland Kitchen levothyroxine (SYNTHROID, LEVOTHROID) 50 MCG tablet Take 50 mcg by mouth daily.      . methocarbamol (ROBAXIN) 500 MG tablet Take 500 mg by mouth 3 (three) times daily.      . mometasone-formoterol (DULERA) 200-5 MCG/ACT AERO Inhale 2 puffs into the lungs 2 (two) times daily.      . montelukast (SINGULAIR) 10 MG tablet Take 1 tablet by mouth daily.      . Multiple Vitamins-Minerals (THERA-M) TABS Take 1 tablet by mouth daily.      Marland Kitchen omeprazole (PRILOSEC) 40 MG capsule Take 40 mg by mouth daily.      . valsartan (DIOVAN) 80 MG tablet Take 1 tablet by mouth daily.      Marland Kitchen venlafaxine (EFFEXOR) 37.5 MG tablet Take 2 tablets (75 mg total) by mouth every  morning.  30 tablet  1  . zolpidem (AMBIEN) 10 MG tablet        No current facility-administered medications on file prior to visit.    BP 98/58  Pulse 103  Wt 263 lb (119.296 kg)chart    Objective:   Physical Exam  Constitutional: She is oriented to person, place, and time. She appears well-developed and well-nourished.  Neck: Normal range of motion. Neck supple. No thyromegaly present.  Cardiovascular: Normal rate, regular rhythm and normal heart sounds.   Pulmonary/Chest: Effort normal and breath sounds normal.  Abdominal: Soft. Bowel sounds are normal.  Musculoskeletal: She exhibits tenderness. She exhibits no edema.  Tenderness to palpation of the lower back, and hip joints. Ambulating with a cane. No swelling to the joints.  Neurological: She is alert and oriented to person, place, and time.  Skin: Skin is warm and dry.  Psychiatric: She has a normal mood and affect.          Assessment & Plan:  Assessment: 1. Fibromyalgia 2. Osteoarthritis 3. Obesity  Plan: Increase Percocet to 12/20/2023 3 times a day as needed for pain. Followup in the pain clinic as scheduled. We will not manage any chronic pain here. Patient is aware and understands.

## 2013-02-27 ENCOUNTER — Ambulatory Visit (INDEPENDENT_AMBULATORY_CARE_PROVIDER_SITE_OTHER): Payer: Medicare HMO | Admitting: Licensed Clinical Social Worker

## 2013-02-27 DIAGNOSIS — F331 Major depressive disorder, recurrent, moderate: Secondary | ICD-10-CM

## 2013-02-27 DIAGNOSIS — F411 Generalized anxiety disorder: Secondary | ICD-10-CM

## 2013-03-04 ENCOUNTER — Encounter: Payer: Self-pay | Admitting: Family

## 2013-03-05 ENCOUNTER — Ambulatory Visit: Payer: Self-pay | Admitting: Pain Medicine

## 2013-03-05 ENCOUNTER — Telehealth: Payer: Self-pay | Admitting: Family

## 2013-03-05 DIAGNOSIS — M1990 Unspecified osteoarthritis, unspecified site: Secondary | ICD-10-CM

## 2013-03-05 DIAGNOSIS — M797 Fibromyalgia: Secondary | ICD-10-CM

## 2013-03-05 NOTE — Telephone Encounter (Signed)
Pt states she was given a temporary supply of  oxyCODONE-acetaminophen (PERCOCET) 10-325 MG per tablet until her appt with pain management today.  Pt states the pain management facility she was seen at today has referred her to [facility].  She is requesting a refill until she can get in with the new pain management facility.

## 2013-03-06 ENCOUNTER — Ambulatory Visit (INDEPENDENT_AMBULATORY_CARE_PROVIDER_SITE_OTHER): Payer: Medicare HMO | Admitting: Licensed Clinical Social Worker

## 2013-03-06 ENCOUNTER — Ambulatory Visit: Payer: Self-pay | Admitting: Family

## 2013-03-06 DIAGNOSIS — F411 Generalized anxiety disorder: Secondary | ICD-10-CM

## 2013-03-06 DIAGNOSIS — F331 Major depressive disorder, recurrent, moderate: Secondary | ICD-10-CM

## 2013-03-06 NOTE — Telephone Encounter (Signed)
Left message for pt to call back  °

## 2013-03-06 NOTE — Telephone Encounter (Signed)
Once she has been seen by the pain clinic we can not intervene. She can see if the pain clinic will write her a temporary supply.

## 2013-03-06 NOTE — Telephone Encounter (Signed)
Pt needs a referral to [facility] for pain management . [facility] can not see pt again due to history suicidal attempts. Pt needs another rx for oxycodone

## 2013-03-09 ENCOUNTER — Encounter: Payer: Self-pay | Admitting: Family

## 2013-03-09 NOTE — Telephone Encounter (Signed)
Left another message for pt to call back. Per Oran Rein, suggest that pt contact [facility] in [place], [place] at [phone], as they treat chronic pain

## 2013-03-09 NOTE — Telephone Encounter (Signed)
Spoke with pt. Pt was denied at [facility] and they suggested that she try [facility] or [facility] because they are not a Artist and are able to care for pts with sudicidal attempts.   Advised pt of Padonda's message and gave her the contact number.   Pt would like to know what she is suppose to do about her pain in the meantime because she is at a 9:10. Please advise

## 2013-03-09 NOTE — Telephone Encounter (Signed)
We cannot manage chronic pain. I apologize and empathize. Call and see if she can get in with [facility]

## 2013-03-10 NOTE — Telephone Encounter (Signed)
Pt aware of Padonda's note. She became upset and stated that she had called [facility] and was told that the only thing they could do for was to refer her to pain clinic because they do not treat chronic pain. I advised pt that I placed the referral for [facility] or [facility] and she should receive a call from one of them. She asked if she should find another primary care provider and I advised that she should do whatever she feels is best for her. However, Oran Rein will not be authorizing any refills of her pain medication.

## 2013-03-16 ENCOUNTER — Other Ambulatory Visit (HOSPITAL_COMMUNITY): Payer: Self-pay | Admitting: Psychiatry

## 2013-03-23 ENCOUNTER — Telehealth (HOSPITAL_COMMUNITY): Payer: Self-pay | Admitting: *Deleted

## 2013-03-23 NOTE — Telephone Encounter (Signed)
RN Magda Paganini with [employer] called stating patient needs a refill on Effexor. Refill not appropriate at this time, has appointment 03/24/13 notified Magda Paganini that refill will be filled at appointment. RN Magda Paganini states she will call patient to remind her.

## 2013-03-24 ENCOUNTER — Telehealth (HOSPITAL_COMMUNITY): Payer: Self-pay | Admitting: *Deleted

## 2013-03-24 ENCOUNTER — Ambulatory Visit (HOSPITAL_COMMUNITY): Payer: Self-pay | Admitting: Psychiatry

## 2013-03-24 NOTE — Telephone Encounter (Signed)
Pt left UG:[id number] not keep appt today with MD. Rescheduled for 04/23/13.States out of medicine for 2 week.Needs refill of Effexor.

## 2013-03-24 NOTE — Telephone Encounter (Signed)
Refill not needed at this time per pt

## 2013-03-25 ENCOUNTER — Telehealth (HOSPITAL_COMMUNITY): Payer: Self-pay | Admitting: *Deleted

## 2013-03-25 DIAGNOSIS — F329 Major depressive disorder, single episode, unspecified: Secondary | ICD-10-CM

## 2013-03-25 MED ORDER — VENLAFAXINE HCL 75 MG PO TABS
75.0000 mg | ORAL_TABLET | Freq: Every day | ORAL | Status: DC
Start: 2013-03-25 — End: 2013-06-16

## 2013-03-25 NOTE — Telephone Encounter (Signed)
Advised MD 1/7 that original Effexor for 37.5 mg, take 2/day, quantity #30, so pt ran out sooner than anticipated. MD ordered Effexor 75 mg daily, #30, with 1 refill

## 2013-03-27 ENCOUNTER — Ambulatory Visit: Payer: Commercial Managed Care - HMO | Admitting: Licensed Clinical Social Worker

## 2013-03-31 ENCOUNTER — Telehealth (HOSPITAL_COMMUNITY): Payer: Self-pay | Admitting: *Deleted

## 2013-03-31 NOTE — Telephone Encounter (Signed)
States stuck in hyperanxiety ever since restarting Effexor.Originally started Effexor at 37.5 for 1 week,then increased to 75mg .Then ran out.Was off for 1 week.Restarted at 75mg  last Wednesday 03/25/13.

## 2013-03-31 NOTE — Telephone Encounter (Signed)
Contacted pt per Dr.Ravi: Break Effexor in half and take 37.5mg  for 4 days until anxiety decreases.Then start back taking 75mg  daily.Pt verbalized understanding

## 2013-04-23 ENCOUNTER — Ambulatory Visit (HOSPITAL_COMMUNITY): Payer: Self-pay | Admitting: Psychiatry

## 2013-05-22 DIAGNOSIS — M7918 Myalgia, other site: Secondary | ICD-10-CM | POA: Insufficient documentation

## 2013-05-22 DIAGNOSIS — G8929 Other chronic pain: Secondary | ICD-10-CM | POA: Insufficient documentation

## 2013-05-26 ENCOUNTER — Telehealth (HOSPITAL_COMMUNITY): Payer: Self-pay | Admitting: *Deleted

## 2013-05-26 NOTE — Telephone Encounter (Signed)
Patient left VM: MD at pain clinic: Kellie Shropshire. Anjam.Contact number [phone] Will fill out Release of Information so that Dr. Einar Grad can talk with him. She has enough medicine to make it about a month at the current dose

## 2013-05-26 NOTE — Telephone Encounter (Signed)
Abigail Caper, MD at [facility], wants to use Effexor extended release for pain control and may require changes in dose.  Dr. Wess Botts told patient that they need her psychiatrist to prescribe the Effexor.

## 2013-05-27 ENCOUNTER — Encounter: Payer: Self-pay | Admitting: Neurology

## 2013-05-27 ENCOUNTER — Ambulatory Visit (INDEPENDENT_AMBULATORY_CARE_PROVIDER_SITE_OTHER): Payer: Commercial Managed Care - HMO | Admitting: Neurology

## 2013-05-27 VITALS — BP 128/82 | HR 76 | Temp 98.5°F | Ht 63.5 in | Wt 249.0 lb

## 2013-05-27 DIAGNOSIS — G4733 Obstructive sleep apnea (adult) (pediatric): Secondary | ICD-10-CM

## 2013-05-27 DIAGNOSIS — F4310 Post-traumatic stress disorder, unspecified: Secondary | ICD-10-CM

## 2013-05-27 DIAGNOSIS — G4700 Insomnia, unspecified: Secondary | ICD-10-CM

## 2013-05-27 DIAGNOSIS — Z9989 Dependence on other enabling machines and devices: Principal | ICD-10-CM

## 2013-05-27 DIAGNOSIS — M797 Fibromyalgia: Secondary | ICD-10-CM

## 2013-05-27 DIAGNOSIS — IMO0001 Reserved for inherently not codable concepts without codable children: Secondary | ICD-10-CM

## 2013-05-27 DIAGNOSIS — F329 Major depressive disorder, single episode, unspecified: Secondary | ICD-10-CM

## 2013-05-27 NOTE — Patient Instructions (Addendum)
Please remember to try to maintain good sleep hygiene, which means: Keep a regular sleep and wake schedule, try not to exercise or have a meal within 2 hours of your bedtime, try to keep your bedroom conducive for sleep, that is, cool and dark, without light distractors such as an illuminated alarm clock, and refrain from watching TV right before sleep or in the middle of the night and do not keep the TV or radio on during the night. Also, try not to use or play on electronic devices at bedtime, such as your cell phone, tablet PC or laptop. If you like to read at bedtime on an electronic device, try to dim the background light as much as possible. Do not eat in the middle of the night.   You can try Melatonin at night for sleep: take 5 to 10 mg one to 2 hours before your bedtime.

## 2013-05-27 NOTE — Progress Notes (Signed)
Subjective:    Patient ID: Abigail Mendez is a 48 y.o. female.  HPI    Interim history:   Abigail Mendez is a very pleasant 48 year old right-handed woman with an underlying complex medical history of hypertension, obesity, FMS with chronic pain, MDD, PTSD, asthma, sickle cell trait, chronic fatigue, IBS, OA, GERD, CTS, allergies, who presents for followup consultation of her OSA. She is on treatment with CPAP. She is unaccompanied today. I first met her on 01/07/2013 at the request of her primary care physician. She was diagnosed with OSA in 1/14. She reported a change in her insurance coverage and need to change her DME company as well as her sleep specialist. I reviewed her sleep study from 04/17/2012 which showed a sleep efficiency of 70.9%. Sleep latency was 32 minutes and REM latency was increased at 306.5 minutes. She had a decrease in rem sleep. She had a total AHI of 20 per hour. Her baseline oxygen saturation was 98% while awake. Oxygen nadir was 77%. Time below 88% saturation was 4.6% of the study. She had a CPAP titration study on 04/22/2012 and I reviewed the test results: She was noted to have a sleep efficiency of 88.4% with a latency to sleep of 25 minutes and REM latency of 77.6 minutes. She had an increased percentage of REM sleep. Baseline oxygen saturation while awake was 98%, lowest asleep oxygen saturation was 80%. Time below 88% saturation was 4.6% of the study. Her overall AHI was reduced at 3.2 per hour. She had a mild increase in periodic leg movements at 7.6 per hour, with an associated arousal index of 0 per hour. She was titrated on CPAP from 5-12 cm.  She was placed on CPAP in 2/14 and I reviewed the patient's CPAP compliance data from 10/09/12 to 01/06/13 (90 days), during which time the patient used CPAP every day except for 2 days. The average usage for all days was 8 hours and 6 minutes. The percent used days greater than 4 hours was 93%, indicating excellent compliance. The  residual AHI was 0.4 per hour, indicating an appropriate treatment pressure of 12 cwp with EPR of 3.   At the time of her first appointment with me she indicated full compliance with CPAP but still had residual issues with nonrestorative sleep and daytime tiredness. Her ESS was 16/24. She has tried improving her sleep hygiene. She had gained wt on Lyrica and stopped it. She also stopped her Wellbutrin, Zoloft, and Abilify on her own. She has been taking hydrocodone and robaxin for FMS. She has not yet seen a pain specialist, but is in the process of getting a referral. She sees Dr. Estanislado Pandy in rheumatology. She used to go to [facility], but stopped going there on 12/25/12. I suggested that she continue using CPAP regularly. I also talked to her about potentially retesting her.  Today, I reviewed her compliance data from 04/21/2013 through 05/26/2013 which is a total of 36 days during which time she is CPAP every night except for one night. Percent used days greater than 4 hours was 86%, indicating very good compliance with an average usage of 6 hours and 28 minutes. Residual AHI is 1.7 per hour with very low leak. Pressure is 12 cm with EPR of 2. Today, she reports, that she often is on CPAP, but not asleep. She feels, she sleeps about 4 hours at max. She has tried Ambien, which did not help her stay asleep, she tried Melatonin 4.5 mg, which  does not help. She is on Clonidine for BP, anxiety and nightmares. She is on Baclofen in lieu of Robaxin and is on Effexor XR 150 mg, which started in November 2014. She was re-started on Abilify 10 mg qHS. She has started seeing a pain Physician, Dr. Wess Botts, in [place].  She has been on Seroquel in the past, but gained a lot of weight. Her total sleep time of 6 hours is misleading she feels. Often she is awake with CPAP on. She will sleep during the day for about half an hour or so and uses her CPAP at the time.  Her Past Medical History Is Significant  For: Past Medical History  Diagnosis Date  . Hypertension   . Obesity   . Depression   . Anxiety   . PTSD (post-traumatic stress disorder)   . Hypothyroidism   . Anemia   . Fibromyalgia   . Sleep apnea   . GERD (gastroesophageal reflux disease)   . OSA on CPAP 01/07/2013  . MDD (major depressive disorder) 01/07/2013  . Daytime somnolence 01/07/2013    Her Past Surgical History Is Significant For: No past surgical history on file.  Her Family History Is Significant For: No family history on file.  Her Social History Is Significant For: History   Social History  . Marital Status: Single    Spouse Name: N/A    Number of Children: N/A  . Years of Education: N/A   Social History Main Topics  . Smoking status: Never Smoker   . Smokeless tobacco: None  . Alcohol Use: No  . Drug Use: No  . Sexual Activity: No   Other Topics Concern  . None   Social History Narrative  . None    Her Allergies Are:  Allergies  Allergen Reactions  . Benztropine Nausea And Vomiting  . Cyclobenzaprine     Hallucinations  . Lactose Intolerance (Gi) Diarrhea  . Lamotrigine     'fall', balance problems  . Latex     Redness,swelling,itching, localized pain  . Lisinopril     cough  . Phenylalanine Rash  :   Her Current Medications Are:  Outpatient Encounter Prescriptions as of 05/27/2013  Medication Sig  . acetaminophen (TYLENOL) 500 MG tablet 1,000 mg.  . albuterol (PROVENTIL HFA;VENTOLIN HFA) 108 (90 BASE) MCG/ACT inhaler Inhale 2 puffs into the lungs 4 (four) times daily.  Marland Kitchen AMLODIPINE BESYLATE PO Take by mouth.  . ARIPiprazole (ABILIFY PO) Take by mouth.  Marland Kitchen aspirin 81 MG tablet Take 81 mg by mouth daily.  Marland Kitchen b complex vitamins tablet Take 1 tablet by mouth daily.  . baclofen (LIORESAL) 10 MG tablet Take 10 mg by mouth 3 (three) times daily.   . Cholecalciferol (VITAMIN D3) 1000 UNITS CAPS Take 2 tablets by mouth daily.  . cloNIDine (CATAPRES) 0.1 MG tablet Take 1 tablet by  mouth 2 (two) times daily.  . fexofenadine (ALLEGRA) 180 MG tablet Take 180 mg by mouth daily.  . furosemide (LASIX) 20 MG tablet Take 1 tablet (20 mg total) by mouth 2 (two) times daily.  . Garlic 0370 MG CAPS Take 5 tablets by mouth daily.  Marland Kitchen guaifenesin (HUMIBID E) 400 MG TABS tablet Take 400 mg by mouth.  . Lactobacillus (PROBIOTIC ACIDOPHILUS) TABS Take 2 tablets by mouth daily.  Marland Kitchen levothyroxine (SYNTHROID, LEVOTHROID) 50 MCG tablet Take 50 mcg by mouth daily.  . Melatonin 1 MG SUBL Place 1 strip under the tongue at bedtime.  . mometasone-formoterol (DULERA) 200-5  MCG/ACT AERO Inhale 2 puffs into the lungs 2 (two) times daily.  . montelukast (SINGULAIR) 10 MG tablet Take 1 tablet by mouth daily.  . Multiple Vitamins-Minerals (THERA-M) TABS Take 1 tablet by mouth daily.  Marland Kitchen omeprazole (PRILOSEC) 40 MG capsule Take 40 mg by mouth daily.  . valsartan (DIOVAN) 80 MG tablet Take 1 tablet by mouth daily.  Marland Kitchen venlafaxine (EFFEXOR) 75 MG tablet Take 1 tablet (75 mg total) by mouth daily.  Marland Kitchen zolpidem (AMBIEN) 10 MG tablet   . [DISCONTINUED] buPROPion (WELLBUTRIN XL) 300 MG 24 hr tablet Take 300 mg by mouth daily.  . [DISCONTINUED] ferrous sulfate 325 (65 FE) MG tablet Take 5 mg by mouth daily.  . [DISCONTINUED] methocarbamol (ROBAXIN) 500 MG tablet Take 500 mg by mouth 3 (three) times daily.  . [DISCONTINUED] oxyCODONE-acetaminophen (PERCOCET) 10-325 MG per tablet Take 1 tablet by mouth every 8 (eight) hours as needed for pain.  :  Review of Systems:  Out of a complete 14 point review of systems, all are reviewed and negative with the exception of these symptoms as listed below:   Review of Systems  Constitutional: Positive for diaphoresis, appetite change and fatigue.  HENT: Positive for rhinorrhea and trouble swallowing.   Eyes: Positive for discharge.  Respiratory: Positive for cough, chest tightness, shortness of breath and wheezing.   Cardiovascular: Positive for palpitations.   Gastrointestinal: Positive for vomiting and constipation.  Endocrine:       Flushing  Genitourinary: Negative.   Musculoskeletal: Positive for arthralgias, back pain, gait problem, joint swelling, myalgias and neck pain.  Skin: Negative.   Allergic/Immunologic: Positive for environmental allergies and food allergies.  Neurological: Positive for dizziness, weakness and numbness.  Hematological:       Anemia  Psychiatric/Behavioral: Positive for sleep disturbance (insomnia, apnea, frequent waking, eds, snoring) and dysphoric mood. The patient is nervous/anxious.     Objective:  Neurologic Exam  Physical Exam Physical Examination:   Filed Vitals:   05/27/13 1134  BP: 128/82  Pulse: 76  Temp: 98.5 F (36.9 C)   General Examination: The patient is a very pleasant 48 y.o. female in no acute distress. She appears well-developed and well-nourished and well groomed. She is obese.   HEENT: Normocephalic, atraumatic, pupils are equal, round and reactive to light and accommodation. Funduscopic exam is normal with sharp disc margins noted. Extraocular tracking is good without limitation to gaze excursion or nystagmus noted. Normal smooth pursuit is noted. Hearing is grossly intact. Tympanic membranes are clear bilaterally. Face is symmetric with normal facial animation and normal facial sensation. Speech is clear with no dysarthria noted. There is no hypophonia. There is no lip, neck/head, jaw or voice tremor. Neck is supple with full range of passive and active motion. There are no carotid bruits on auscultation. Oropharynx exam reveals: mild mouth dryness, adequate dental hygiene and moderate airway crowding, due to narrow airway, tonsils of 2+ and larger tongue. Mallampati is class III. Tongue protrudes centrally and palate elevates symmetrically. Neck size is 18 inches.   Chest: Clear to auscultation without wheezing, rhonchi or crackles noted.  Heart: S1+S2+0, regular and normal without  murmurs, rubs or gallops noted.   Abdomen: Soft, non-tender and non-distended with normal bowel sounds appreciated on auscultation.  Extremities: There is 1+ pitting edema in the distal lower extremities bilaterally. Pedal pulses are intact.  Skin: Warm and dry without trophic changes noted. There are no varicose veins.  Musculoskeletal: exam reveals no obvious joint deformities, tenderness or joint  swelling or erythema.   Neurologically:  Mental status: The patient is awake, alert and oriented in all 4 spheres. Her memory, attention, language and knowledge are appropriate. There is no aphasia, agnosia, apraxia or anomia. Speech is clear with normal prosody and enunciation. Thought process is linear. Mood is depressed and affect is blunted.  Cranial nerves are as described above under HEENT exam. In addition, shoulder shrug is normal with equal shoulder height noted. Motor exam: Normal bulk, strength and tone is noted. There is no drift, tremor or rebound. Romberg is negative. Reflexes are 2+ throughout. Toes are downgoing bilaterally. Fine motor skills are intact with normal finger taps, normal hand movements, normal rapid alternating patting, normal foot taps and normal foot agility.  Cerebellar testing shows no dysmetria or intention tremor on finger to nose testing. Heel to shin is unremarkable bilaterally. There is no truncal or gait ataxia.  Sensory exam is intact to light touch, pinprick, vibration, temperature sense in the upper and lower extremities.  Gait, station and balance: She stands up slowly. She walks with a cane for better balance. No veering to one side is noted. No leaning to one side is noted. Posture is age-appropriate and stance is narrow based. No problems turning are noted.  Assessment and Plan:   In summary, TEMECA SOMMA is a very pleasant 48 year old female with a complex underlying medical history and a recent diagnosis of obstructive sleep apnea, now on CPAP. Her  compliance data indicates that she is fully compliant with therapy. Unfortunately she has residual daytime symptoms including non-restorative sleep, sleep disruption and problems achieving and maintaining sleep. This has been a chronic problem. Again, I do feel that her underlying mood disorder and chronic pain play a role in her sleep related symptoms. She is encouraged to continue to use CPAP regularly and is congratulated on her great compliance. I do not think we need to repeat sleep testing at this point. She has tried to adhere to a schedule for her bedtime and rise time in the mornings. She has no caffeine intake and does not drink alcohol. She tries to good sleep hygiene. She has been tried on multiple medications for her sleep including Seroquel and unfortunately at this point I do not think additional prescription sleep aids are necessary or safe. She is on multiple psychotropic medications. I have asked her to try a higher dose of melatonin, up to 10 mg and encouraged her to take it 1-2 hours before her scheduled bedtime which is currently at 10 PM.  We also talked about trying to maintaining a healthy lifestyle in general. I encouraged the patient to eat healthy, exercise daily and keep well hydrated, to keep a scheduled bedtime and wake time routine, to not skip any meals and eat healthy snacks in between meals and to have protein with every meal. I stressed the importance of regular exercise. She has been able to lose some weight.  I answered all her questions today and the patient was in agreement with the above outlined plan. I would like to see the patient back in 6 months, sooner if the need arises and encouraged her to call with any interim questions, concerns, problems or updates.

## 2013-05-28 ENCOUNTER — Encounter: Payer: Self-pay | Admitting: Neurology

## 2013-06-16 ENCOUNTER — Ambulatory Visit (INDEPENDENT_AMBULATORY_CARE_PROVIDER_SITE_OTHER): Payer: Medicare HMO | Admitting: Psychiatry

## 2013-06-16 VITALS — BP 120/84 | HR 92 | Ht 63.0 in | Wt 252.6 lb

## 2013-06-16 DIAGNOSIS — F4310 Post-traumatic stress disorder, unspecified: Secondary | ICD-10-CM

## 2013-06-16 DIAGNOSIS — F329 Major depressive disorder, single episode, unspecified: Secondary | ICD-10-CM

## 2013-06-16 MED ORDER — VENLAFAXINE HCL ER 150 MG PO CP24: 150.0000 mg | ORAL_CAPSULE | Freq: Every day | ORAL | Status: DC

## 2013-06-16 NOTE — Progress Notes (Signed)
Patient ID: Abigail Mendez, female   DOB: 16-Apr-1965, 48 y.o.   MRN: [id number]  [facility] [id number] Progress Note  Abigail Mendez [id number] 48 y.o.  06/16/2013 12:44 PM  Chief Complaint: "depression"  History of Present Illness:Patient is a 48 yo AAF with long history of depression who was seen for a follow up. At her last appointment, the Effexor was increased to 75mg  . Patient reports this has improved her mood significantly. She has seen a pain doctor this month, Dr.Kamal who increased the dose of Effexor to 150mg  to help with pain. Patient reports this dosage has not yet helped with her pain. Sleep has improved, patient was started on Ambien by another doctor, states this has been helpful to some extent. She has restarted water aerobics.     Suicidal Ideation: No Plan Formed: No Patient has means to carry out plan: No  Homicidal Ideation: No Plan Formed: No Patient has means to carry out plan: No  Review of Systems: Psychiatric: Agitation: No Hallucination: No Depressed Mood: Yes Insomnia: No Hypersomnia: No Altered Concentration: Yes Feels Worthless: Yes Grandiose Ideas: No Belief In Special Powers: No New/Increased Substance Abuse: No Compulsions: No  Neurologic: Headache: Yes Seizure: No Paresthesias: No  Past Medical Family, Social History: Multiple medical conditions. Never married, currently on disability and lives alone.   Outpatient Encounter Prescriptions as of 06/16/2013  Medication Sig  . acetaminophen (TYLENOL) 500 MG tablet 1,000 mg.  . albuterol (PROVENTIL HFA;VENTOLIN HFA) 108 (90 BASE) MCG/ACT inhaler Inhale 2 puffs into the lungs 4 (four) times daily.  Marland Kitchen AMLODIPINE BESYLATE PO Take by mouth.  . ARIPiprazole (ABILIFY PO) Take by mouth.  Marland Kitchen aspirin 81 MG tablet Take 81 mg by mouth daily.  Marland Kitchen b complex vitamins tablet Take 1 tablet by mouth daily.  . baclofen (LIORESAL) 10 MG tablet Take 10 mg by mouth 3 (three) times daily.   .  Cholecalciferol (VITAMIN D3) 1000 UNITS CAPS Take 2 tablets by mouth daily.  . cloNIDine (CATAPRES) 0.1 MG tablet Take 1 tablet by mouth 2 (two) times daily.  . fexofenadine (ALLEGRA) 180 MG tablet Take 180 mg by mouth daily.  . furosemide (LASIX) 20 MG tablet Take 1 tablet (20 mg total) by mouth 2 (two) times daily.  . Garlic 2993 MG CAPS Take 5 tablets by mouth daily.  Marland Kitchen guaifenesin (HUMIBID E) 400 MG TABS tablet Take 400 mg by mouth.  . Lactobacillus (PROBIOTIC ACIDOPHILUS) TABS Take 2 tablets by mouth daily.  Marland Kitchen levothyroxine (SYNTHROID, LEVOTHROID) 50 MCG tablet Take 50 mcg by mouth daily.  . Melatonin 1 MG SUBL Place 1 strip under the tongue at bedtime.  . mometasone-formoterol (DULERA) 200-5 MCG/ACT AERO Inhale 2 puffs into the lungs 2 (two) times daily.  . montelukast (SINGULAIR) 10 MG tablet Take 1 tablet by mouth daily.  . Multiple Vitamins-Minerals (THERA-M) TABS Take 1 tablet by mouth daily.  Marland Kitchen omeprazole (PRILOSEC) 40 MG capsule Take 40 mg by mouth daily.  . valsartan (DIOVAN) 80 MG tablet Take 1 tablet by mouth daily.  Marland Kitchen venlafaxine (EFFEXOR) 75 MG tablet Take 1 tablet (75 mg total) by mouth daily.  Marland Kitchen zolpidem (AMBIEN) 10 MG tablet     Past Psychiatric History/Hospitalization(s): Anxiety: Yes Bipolar Disorder: No Depression: Yes Mania: No Psychosis: No Schizophrenia: No Personality Disorder: No Hospitalization for psychiatric illness: Yes History of Electroconvulsive Shock Therapy: No Prior Suicide Attempts: Yes  Physical Exam: Constitutional:  BP- 130/76 mm hg WT: 260.00 lbs Height: 5\' 3"   General Appearance: obese  Musculoskeletal: Strength & Muscle Tone: decreased Gait & Station: unsteady, walks with a walking stick Patient leans: N/A  Psychiatric: Speech (describe rate, volume, coherence, spontaneity, and abnormalities if any): Normal rate, soft  Thought Process (describe rate, content, abstract reasoning, and computation): Normal, obsesses about her  worthlessness  Associations: Coherent  Thoughts: normal  Mental Status: Orientation: oriented to person, place, time/date and situation Mood & Affect: depressed affect and anxiety Attention Span & Concentration: Normal  Medical Decision Making (Choose Three): Established Problem, Stable/Improving (1), Review of Last Therapy Session (1), Review of Medication Regimen & Side Effects (2) and Review of New Medication or Change in Dosage (2)  Assessment: Axis I: MDD, PTSD  Axis II: deferred  Axis III: Fibromyalgia, Chronic fatigue syndrome  Axis IV: multiple medical illnesses, lack of support, lives alone  Axis V: 43   Plan: Continue Effexor at 150 mg, patient made aware of side effects and benefits.  Recommend minimizing narcotic pain medications.  Patient encouraged to stay physically active. Continue to follow up with therapy appointments. RTC in 3 months.  Elvin So, MD 06/16/2013

## 2013-07-23 ENCOUNTER — Other Ambulatory Visit: Payer: Self-pay

## 2013-07-23 DIAGNOSIS — Z1231 Encounter for screening mammogram for malignant neoplasm of breast: Secondary | ICD-10-CM

## 2013-07-24 ENCOUNTER — Ambulatory Visit
Admission: RE | Admit: 2013-07-24 | Discharge: 2013-07-24 | Disposition: A | Discharge status: Home / Self Care | Payer: Commercial Managed Care - HMO | Source: Ambulatory Visit

## 2013-07-24 DIAGNOSIS — Z1231 Encounter for screening mammogram for malignant neoplasm of breast: Secondary | ICD-10-CM

## 2013-09-03 DIAGNOSIS — G2581 Restless legs syndrome: Secondary | ICD-10-CM | POA: Insufficient documentation

## 2013-09-15 ENCOUNTER — Ambulatory Visit (HOSPITAL_COMMUNITY): Payer: Self-pay | Admitting: Psychiatry

## 2013-09-15 ENCOUNTER — Ambulatory Visit (INDEPENDENT_AMBULATORY_CARE_PROVIDER_SITE_OTHER): Payer: Commercial Managed Care - HMO | Admitting: Psychiatry

## 2013-09-15 ENCOUNTER — Encounter (HOSPITAL_COMMUNITY): Payer: Self-pay | Admitting: Psychiatry

## 2013-09-15 VITALS — BP 105/61 | HR 78 | Ht 63.0 in | Wt 277.4 lb

## 2013-09-15 DIAGNOSIS — F332 Major depressive disorder, recurrent severe without psychotic features: Secondary | ICD-10-CM

## 2013-09-15 DIAGNOSIS — F329 Major depressive disorder, single episode, unspecified: Secondary | ICD-10-CM

## 2013-09-15 DIAGNOSIS — F411 Generalized anxiety disorder: Secondary | ICD-10-CM | POA: Insufficient documentation

## 2013-09-15 DIAGNOSIS — F4310 Post-traumatic stress disorder, unspecified: Secondary | ICD-10-CM | POA: Diagnosis not present

## 2013-09-15 MED ORDER — HYDROXYZINE PAMOATE 25 MG PO CAPS: 25.0000 mg | ORAL_CAPSULE | Freq: Every evening | ORAL | Status: DC | PRN

## 2013-09-15 MED ORDER — ARIPIPRAZOLE 10 MG PO TABS: 10.0000 mg | ORAL_TABLET | Freq: Every day | ORAL | Status: DC

## 2013-09-15 MED ORDER — LITHIUM CARBONATE 300 MG PO TABS: 300.0000 mg | ORAL_TABLET | Freq: Every day | ORAL | Status: DC
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
## 2013-10-16 ENCOUNTER — Telehealth (HOSPITAL_COMMUNITY): Payer: Self-pay | Admitting: *Deleted

## 2013-10-16 DIAGNOSIS — F411 Generalized anxiety disorder: Secondary | ICD-10-CM

## 2013-10-16 DIAGNOSIS — F332 Major depressive disorder, recurrent severe without psychotic features: Secondary | ICD-10-CM

## 2013-10-16 DIAGNOSIS — F4310 Post-traumatic stress disorder, unspecified: Secondary | ICD-10-CM

## 2013-10-16 NOTE — Telephone Encounter (Signed)
Pt called about her Lithium 200 mg. Pt states her medication is not working for her and would like to increase in dose to 300 mg. Please call pt at [phone]. Please advise. Pt states she feels fatigue, depression (with not suicidal symptoms), lack of motivation. Pt states she is still feeling the same.

## 2013-10-20 MED ORDER — HYDROXYZINE PAMOATE 25 MG PO CAPS: 50.0000 mg | ORAL_CAPSULE | Freq: Every evening | ORAL | Status: DC | PRN

## 2013-10-20 MED ORDER — LITHIUM CARBONATE 300 MG PO TABS: 450.0000 mg | ORAL_TABLET | Freq: Every day | ORAL | Status: DC

## 2013-10-20 NOTE — Addendum Note (Signed)
Addended by: Charlcie Cradle on: 10/20/2013 12:22 PM   Modules accepted: Orders

## 2013-10-20 NOTE — Telephone Encounter (Signed)
Pt states she is not doing well. Reports continued crying spells and depression. Reports low motivation, insomnia and fatigue. Pt is getting about 4 hrs/night. Appetite is fair. Reports she had one episode of passive SI without plan or intent yesterday. Pt is taking meds as prescribed and reports SE of dry mouth. She is using Biotin mouth wash, sugar free candies and drinking lot of water to help with dry mouth.   A/P: MDD, PTSD, GAD  -Abilifty 10mg  po qD for mood augmentation.  -increase Lithium to 450mg  po qD for mood. As pt reports poor response to multiple antidepressants in the past. Will use of trial of mood stabilizer in effort to improve depression symptoms.  - Vistaril increase to 50mg  po qHS prn insomnia. Recommended she stop taking Allegra due to antihistaminic effects.

## 2013-11-05 ENCOUNTER — Telehealth (HOSPITAL_COMMUNITY): Payer: Self-pay | Admitting: *Deleted

## 2013-11-05 NOTE — Telephone Encounter (Signed)
Patient left [id number] was changed to 450 mg, but never sent to pharmacy, or they never got it. Now she is out of medicine. Contacted pharmcy - they have RX on file -was too soon to fill when received on 8/4 (had just filled 7/27) They will fill for patient. Phoned patient to let her know

## 2013-11-10 ENCOUNTER — Other Ambulatory Visit (HOSPITAL_COMMUNITY): Payer: Self-pay | Admitting: Psychiatry

## 2013-11-17 ENCOUNTER — Encounter (HOSPITAL_COMMUNITY): Payer: Self-pay | Admitting: Psychiatry

## 2013-11-17 ENCOUNTER — Ambulatory Visit (INDEPENDENT_AMBULATORY_CARE_PROVIDER_SITE_OTHER): Payer: Commercial Managed Care - HMO | Admitting: Psychiatry

## 2013-11-17 VITALS — BP 134/98 | HR 75 | Ht 63.75 in | Wt 281.0 lb

## 2013-11-17 DIAGNOSIS — F411 Generalized anxiety disorder: Secondary | ICD-10-CM

## 2013-11-17 DIAGNOSIS — F332 Major depressive disorder, recurrent severe without psychotic features: Secondary | ICD-10-CM

## 2013-11-17 DIAGNOSIS — F4310 Post-traumatic stress disorder, unspecified: Secondary | ICD-10-CM

## 2013-11-17 DIAGNOSIS — F329 Major depressive disorder, single episode, unspecified: Secondary | ICD-10-CM

## 2013-11-17 MED ORDER — ARIPIPRAZOLE 15 MG PO TABS: 15.0000 mg | ORAL_TABLET | Freq: Every day | ORAL | Status: DC

## 2013-11-17 MED ORDER — LITHIUM CARBONATE 300 MG PO TABS: 600.0000 mg | ORAL_TABLET | Freq: Every day | ORAL | Status: DC
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
## 2013-11-30 ENCOUNTER — Encounter: Payer: Self-pay | Admitting: Neurology

## 2013-11-30 ENCOUNTER — Ambulatory Visit (INDEPENDENT_AMBULATORY_CARE_PROVIDER_SITE_OTHER): Payer: Medicare HMO | Admitting: Neurology

## 2013-11-30 VITALS — BP 101/68 | HR 68 | Wt 279.4 lb

## 2013-11-30 DIAGNOSIS — G4733 Obstructive sleep apnea (adult) (pediatric): Secondary | ICD-10-CM

## 2013-11-30 DIAGNOSIS — Z9989 Dependence on other enabling machines and devices: Principal | ICD-10-CM

## 2013-11-30 DIAGNOSIS — F39 Unspecified mood [affective] disorder: Secondary | ICD-10-CM

## 2013-11-30 NOTE — Patient Instructions (Signed)
Please continue using your CPAP regularly. While your insurance requires that you use CPAP at least 4 hours each night on 70% of the nights, I recommend, that you not skip any nights and use it throughout the night if you can. Getting used to CPAP and staying with the treatment long term does take time and patience and discipline. Untreated obstructive sleep apnea when it is moderate to severe can have an adverse impact on cardiovascular health and raise her risk for heart disease, arrhythmias, hypertension, congestive heart failure, stroke and diabetes. Untreated obstructive sleep apnea causes sleep disruption, nonrestorative sleep, and sleep deprivation. This can have an impact on your day to day functioning and cause daytime sleepiness and impairment of cognitive function, memory loss, mood disturbance, and problems focussing. Using CPAP regularly can improve these symptoms. Keep up the good work! I will see you back in 12 months for sleep apnea since you are doing so well.

## 2013-11-30 NOTE — Progress Notes (Signed)
Subjective:    Patient ID: Abigail Mendez is a 48 y.o. female.  HPI    Interim history:   Ms. Thoen is a 48 year old right-handed woman with an underlying complex medical history of hypertension, obesity, FMS with chronic pain, MDD, PTSD, asthma, sickle cell trait, chronic fatigue, IBS, OA, GERD, CTS, and allergies, who presents for followup consultation of her OSA. She is unaccompanied today. I last saw on 05/27/2013, at which time she was compliant with CPAP therapy but reported residual daytime symptoms including non-restorative sleep, sleep disruption and problems achieving and maintaining sleep. This had been a chronic problem. I talked to her about maintaining good sleep hygiene. I advised her against trying additional psychotropic or prescription sleep aids but suggested that she could try melatonin.   Today, she reports that she tried vistaril 25 to 50 mg per psychiatrist, and she had tried Ambien, which did not help and she tried melatonin, which also did not help. She fell in May and had a black eye. She went to [facility] and had a CTH, which per her was negative. She has a new Psychiatrist and is no longer on Effexor, and is now on a higher dose of Abilify and is now on Lithium 600 mg daily. She has been fully compliant with CPAP and continues to indicate it helps her sleep and her daytime sleepiness, sleep quality. She has nocturia still about 4 times per night. She has gained weight.  Today, I reviewed her compliance data from 09/01/2013 through 11/29/2013 which is the last 90 days during which time she used her machine every night. Percent used days greater than 4 hours was 89%, indicating very good compliance. Average usage was 6 hours and 25 minutes. Residual AHI was low at 0.9 per hour, leak was acceptable at 15.5 L per minute at the 95th percentile. She shares a letter with me that she got from her insurance provider. She says that she may not be able to keep her CPAP  machine as her insurance is no longer covering it. She says that she never had a face-to-face after her original sleep study or before her original sleep study which was in January of last year. I first met her in October of last year after she needed to switch sleep positions after an insurance changes understand. I have reviewed her sleep studies from January and February of last year and she has always been compliant with treatment.   I first met her on 01/07/2013 at the request of her primary care physician. She was diagnosed with OSA in 1/14. She reported a change in her insurance coverage and need to change her DME company as well as her sleep specialist. I reviewed her sleep study from 04/17/2012 which showed a sleep efficiency of 70.9%. Sleep latency was 32 minutes and REM latency was increased at 306.5 minutes. She had a decrease in rem sleep. She had a total AHI of 20 per hour. Her baseline oxygen saturation was 98% while awake. Oxygen nadir was 77%. Time below 88% saturation was 4.6% of the study. She had a CPAP titration study on 04/22/2012 and I reviewed the test results: She was noted to have a sleep efficiency of 88.4% with a latency to sleep of 25 minutes and REM latency of 77.6 minutes. She had an increased percentage of REM sleep. Baseline oxygen saturation while awake was 98%, lowest asleep oxygen saturation was 80%. Time below 88% saturation was 4.6% of the study. Her overall AHI  was reduced at 3.2 per hour. She had a mild increase in periodic leg movements at 7.6 per hour, with an associated arousal index of 0 per hour. She was titrated on CPAP from 5-12 cm.  She was placed on CPAP in 2/14 and I reviewed the patient's CPAP compliance data from 10/09/12 to 01/06/13 (90 days), during which time the patient used CPAP every day except for 2 days. The average usage for all days was 8 hours and 6 minutes. The percent used days greater than 4 hours was 93%, indicating excellent compliance. The  residual AHI was 0.4 per hour, indicating an appropriate treatment pressure of 12 cwp with EPR of 3.  At the time of her first appointment with me she indicated full compliance with CPAP but still had residual issues with nonrestorative sleep and daytime tiredness. Her ESS was 16/24. She has tried improving her sleep hygiene. She had gained wt on Lyrica and stopped it. She also stopped her Wellbutrin, Zoloft, and Abilify on her own. She has been taking hydrocodone and robaxin for FMS. She has not yet seen a pain specialist, but is in the process of getting a referral. She sees Dr. Estanislado Pandy in rheumatology. She used to go [facility], but stopped going there on 12/25/12. I suggested that she continue using CPAP regularly. I also talked to her about potentially retesting her.  I reviewed her compliance data from 04/21/2013 through 05/26/2013 which is a total of 36 days during which time she is CPAP every night except for one night. Percent used days greater than 4 hours was 86%, indicating very good compliance with an average usage of 6 hours and 28 minutes. Residual AHI is 1.7 per hour with very low leak. Pressure is 12 cm with EPR of 2.  She had tried Ambien, which did not help her stay asleep, she tried Melatonin 4.5 mg, which did not help. She is on Clonidine for BP, anxiety and nightmares. She is on Baclofen in lieu of Robaxin and is on Effexor XR 150 mg, which started in November 2014. She was re-started on Abilify 10 mg qHS. She has started seeing a pain Physician, Dr. Wess Botts, in [place]. She had been on Seroquel in the past, but gained a lot of weight.   Her Past Medical History Is Significant For: Past Medical History  Diagnosis Date  . Hypertension   . Obesity   . Depression   . Anxiety   . PTSD (post-traumatic stress disorder)   . Hypothyroidism   . Anemia   . Fibromyalgia   . Sleep apnea   . GERD (gastroesophageal reflux disease)   . OSA on CPAP 01/07/2013  . MDD (major  depressive disorder) 01/07/2013  . Daytime somnolence 01/07/2013  . Chronic pain disorder   . Sickle cell trait   . Asthma   . Seasonal allergies     Her Past Surgical History Is Significant For: Past Surgical History  Procedure Laterality Date  . No past surgeries      Her Family History Is Significant For: Family History  Problem Relation Age of Onset  . Bipolar disorder Sister   . Suicidality Neg Hx     Her Social History Is Significant For: History   Social History  . Marital Status: Single    Spouse Name: N/A    Number of Children: N/A  . Years of Education: N/A   Social History Main Topics  . Smoking status: Never Smoker   . Smokeless  tobacco: Never Used  . Alcohol Use: No  . Drug Use: No  . Sexual Activity: No   Other Topics Concern  . None   Social History Narrative  . None    Her Allergies Are:  Allergies  Allergen Reactions  . Benztropine Nausea And Vomiting  . Cyclobenzaprine     Hallucinations  . Lactose Intolerance (Gi) Diarrhea  . Lamotrigine     'fall', balance problems  . Latex     Redness,swelling,itching, localized pain  . Lisinopril     cough  . Phenylalanine Rash  :   Her Current Medications Are:  Outpatient Encounter Prescriptions as of 11/30/2013  Medication Sig  . acetaminophen (TYLENOL) 500 MG tablet 1,000 mg.  . albuterol (PROVENTIL HFA;VENTOLIN HFA) 108 (90 BASE) MCG/ACT inhaler Inhale 2 puffs into the lungs 4 (four) times daily.  Marland Kitchen AMLODIPINE BESYLATE PO Take by mouth.  . ARIPiprazole (ABILIFY) 15 MG tablet Take 1 tablet (15 mg total) by mouth daily.  Marland Kitchen aspirin 81 MG tablet Take 81 mg by mouth daily.  Marland Kitchen b complex vitamins tablet Take 1 tablet by mouth daily.  . baclofen (LIORESAL) 10 MG tablet Take 10 mg by mouth 3 (three) times daily.   . Cholecalciferol (VITAMIN D3) 1000 UNITS CAPS Take 2 tablets by mouth daily.  . cloNIDine (CATAPRES) 0.1 MG tablet Take 1 tablet by mouth 2 (two) times daily. 1 TAB IN THE AM 2 TABS IN  THE PM  . fexofenadine (ALLEGRA) 180 MG tablet Take 180 mg by mouth daily.  . furosemide (LASIX) 20 MG tablet Take 1 tablet (20 mg total) by mouth 2 (two) times daily.  . Gabapentin Enacarbil (HORIZANT) 600 MG TBCR Take 600 mg by mouth 2 (two) times daily.  . Garlic 8768 MG CAPS Take 5 tablets by mouth daily.  Marland Kitchen guaifenesin (HUMIBID E) 400 MG TABS tablet Take 400 mg by mouth.  . Lactobacillus (PROBIOTIC ACIDOPHILUS) TABS Take 2 tablets by mouth daily.  Marland Kitchen levothyroxine (SYNTHROID, LEVOTHROID) 50 MCG tablet Take 50 mcg by mouth daily.  Marland Kitchen lithium 300 MG tablet Take 2 tablets (600 mg total) by mouth daily.  . Melatonin 1 MG SUBL Place 1 strip under the tongue at bedtime.  . mometasone-formoterol (DULERA) 200-5 MCG/ACT AERO Inhale 2 puffs into the lungs 2 (two) times daily.  . montelukast (SINGULAIR) 10 MG tablet Take 1 tablet by mouth daily.  . Multiple Vitamins-Minerals (THERA-M) TABS Take 1 tablet by mouth daily.  Marland Kitchen omeprazole (PRILOSEC) 40 MG capsule Take 40 mg by mouth daily.  . valsartan (DIOVAN) 80 MG tablet Take 1 tablet by mouth daily.  :  Review of Systems:  Out of a complete 14 point review of systems, all are reviewed and negative with the exception of these symptoms as listed below:   Review of Systems  Constitutional: Positive for appetite change, fatigue and unexpected weight change.  HENT: Positive for tinnitus and trouble swallowing.   Eyes: Positive for discharge. Visual disturbance: double vision, blurred vision.  Respiratory: Positive for chest tightness, shortness of breath and wheezing.   Cardiovascular: Positive for palpitations.  Gastrointestinal: Positive for constipation.  Endocrine: Positive for polydipsia.  Genitourinary: Positive for dysuria and frequency.  Musculoskeletal: Positive for arthralgias, back pain, gait problem, joint swelling, myalgias and neck pain.  Skin:       moles  Neurological: Positive for dizziness, tremors, weakness, numbness and  headaches.  Hematological:       Anemia  Psychiatric/Behavioral: Positive for sleep disturbance (Restless  Leg, Insomnia, Apnea, Frequent Waking, Daytime Sleepiness) and decreased concentration. The patient is nervous/anxious.        Depression    Objective:  Neurologic Exam  Physical Exam Physical Examination:   Filed Vitals:   11/30/13 1229  BP: 101/68  Pulse: 68   General Examination: The patient is a very pleasant 48 y.o. female in no acute distress. She appears well-developed and well-nourished and well groomed. She is obese and has gained weight.   HEENT: Normocephalic, atraumatic, pupils are equal, round and reactive to light and accommodation. Funduscopic exam is normal with sharp disc margins noted. Extraocular tracking is good without limitation to gaze excursion or nystagmus noted. Normal smooth pursuit is noted. Hearing is grossly intact. Tympanic membranes are clear bilaterally. Face is symmetric with normal facial animation and normal facial sensation. Speech is clear with no dysarthria noted. There is no hypophonia. There is no lip, neck/head, jaw or voice tremor. Neck is supple with full range of passive and active motion. There are no carotid bruits on auscultation. Oropharynx exam reveals: mild mouth dryness, adequate dental hygiene and moderate airway crowding, due to narrow airway, tonsils of 2+ and larger tongue. Mallampati is class III. Tongue protrudes centrally and palate elevates symmetrically. Neck size is 18 inches.   Chest: Clear to auscultation without wheezing, rhonchi or crackles noted.  Heart: S1+S2+0, regular and normal without murmurs, rubs or gallops noted.   Abdomen: Soft, non-tender and non-distended with normal bowel sounds appreciated on auscultation.  Extremities: There is trace pitting edema in the distal lower extremities bilaterally. Pedal pulses are intact.  Skin: Warm and dry without trophic changes noted. There are no varicose  veins.  Musculoskeletal: exam reveals no obvious joint deformities, tenderness or joint swelling or erythema.   Neurologically:  Mental status: The patient is awake, alert and oriented in all 4 spheres. Her memory, attention, language and knowledge are appropriate. There is no aphasia, agnosia, apraxia or anomia. Speech is clear with normal prosody and enunciation. Thought process is linear. Mood is depressed and affect is blunted.  Cranial nerves are as described above under HEENT exam. In addition, shoulder shrug is normal with equal shoulder height noted. Motor exam: Normal bulk, strength and tone is noted. There is no drift, tremor or rebound. Romberg is negative. Reflexes are 2+ throughout. Toes are downgoing bilaterally. Fine motor skills are intact with normal finger taps, normal hand movements, normal rapid alternating patting, normal foot taps and normal foot agility.  Cerebellar testing shows no dysmetria or intention tremor on finger to nose testing. Heel to shin is unremarkable bilaterally. There is no truncal or gait ataxia.  Sensory exam is intact to light touch, pinprick, vibration, temperature sense in the upper and lower extremities.  Gait, station and balance: She stands up slowly. She walks with a cane for better balance. No veering to one side is noted. No leaning to one side is noted. Posture is age-appropriate and stance is narrow based. No problems turning are noted.  Assessment and Plan:   In summary, ALYSHIA KERNAN is a very pleasant 48 year old female with a complex underlying medical history and a diagnosis of moderate obstructive sleep apnea in 1/14, on CPAP with full compliance and good results.  she has been compliant since I met her in October 2014. She indicates ongoing good results with CPAP but unfortunately may not be able to keep her machine as her insurance they deny payment. Apparently she never had a face-to-face visit when she  was first diagnosed with sleep  apnea in January 2014. I congratulated her on her great compliance. We talked about her sleep study reports again today and about her most recent compliance data. She's encouraged to be fully compliant with treatment and if she has to go through the full evaluation again with sleep study and agree titration we may have to go that route. It would be a shame if we have to do retesting just for insurance purposes since she is well treated and fully compliant. Since she is doing well from my end of things I will see her back once a year or as needed. She was in agreement.

## 2013-12-01 ENCOUNTER — Telehealth (HOSPITAL_COMMUNITY): Payer: Self-pay | Admitting: *Deleted

## 2013-12-01 DIAGNOSIS — F332 Major depressive disorder, recurrent severe without psychotic features: Secondary | ICD-10-CM

## 2013-12-01 DIAGNOSIS — F4310 Post-traumatic stress disorder, unspecified: Secondary | ICD-10-CM

## 2013-12-01 DIAGNOSIS — F411 Generalized anxiety disorder: Secondary | ICD-10-CM

## 2013-12-01 MED ORDER — LITHIUM CARBONATE 300 MG PO TABS: 600.0000 mg | ORAL_TABLET | Freq: Every day | ORAL | Status: DC

## 2013-12-01 NOTE — Telephone Encounter (Signed)
States was on Lithium 450 mg. MD prescribed  Lithium 600 mg. Was supposed to call office with correct pharmacy to send RX to. Pharmacy is [facility] on  [address]. RX for Lithium 300 mg tablet take 2 daily in chart from last visit on 9/1. "NO Print" function selected. Sent now to [facility] on [address]. Phoned pt - left message that RX sent to [facility] today

## 2013-12-02 NOTE — Telephone Encounter (Signed)
error 

## 2013-12-24 ENCOUNTER — Other Ambulatory Visit (HOSPITAL_COMMUNITY): Payer: Self-pay | Admitting: Psychiatry

## 2013-12-25 ENCOUNTER — Telehealth (HOSPITAL_COMMUNITY): Payer: Self-pay | Admitting: *Deleted

## 2013-12-25 DIAGNOSIS — F4310 Post-traumatic stress disorder, unspecified: Secondary | ICD-10-CM

## 2013-12-25 DIAGNOSIS — F411 Generalized anxiety disorder: Secondary | ICD-10-CM

## 2013-12-25 DIAGNOSIS — F332 Major depressive disorder, recurrent severe without psychotic features: Secondary | ICD-10-CM

## 2013-12-25 MED ORDER — ARIPIPRAZOLE 15 MG PO TABS: 15.0000 mg | ORAL_TABLET | Freq: Every day | ORAL | Status: DC

## 2013-12-25 NOTE — Telephone Encounter (Signed)
Chart reviewed, refill not appropriate. Filled 9/1 with 1 refill on file. Notified patient that refill was on file with her pharmacy.

## 2013-12-25 NOTE — Telephone Encounter (Signed)
Pt called stating her refill was not on file with [facility]. Called [facility] and they indeed do not have any record of ever filling Abilify. Called patient back and she states the last fill was [facility] with her old psychiatrist and she told Dr. Doyne Keel that she had 1 month supply left when she had appointment in Sept. Prescription was not given during that appointment states "no print" on chart since she had enough for 1 month. She now needs prescription. Refill appropriate.

## 2013-12-25 NOTE — Telephone Encounter (Signed)
Opened in error, 2 patients with same name

## 2014-01-06 ENCOUNTER — Other Ambulatory Visit (HOSPITAL_COMMUNITY): Payer: Self-pay | Admitting: Psychiatry

## 2014-01-19 ENCOUNTER — Encounter (HOSPITAL_COMMUNITY): Payer: Self-pay | Admitting: Psychiatry

## 2014-01-19 ENCOUNTER — Ambulatory Visit (INDEPENDENT_AMBULATORY_CARE_PROVIDER_SITE_OTHER): Payer: Commercial Managed Care - HMO | Admitting: Psychiatry

## 2014-01-19 VITALS — BP 125/80 | HR 76 | Ht 63.75 in | Wt 274.0 lb

## 2014-01-19 DIAGNOSIS — F332 Major depressive disorder, recurrent severe without psychotic features: Secondary | ICD-10-CM

## 2014-01-19 DIAGNOSIS — F4310 Post-traumatic stress disorder, unspecified: Secondary | ICD-10-CM

## 2014-01-19 DIAGNOSIS — F411 Generalized anxiety disorder: Secondary | ICD-10-CM

## 2014-01-19 MED ORDER — PAROXETINE HCL 20 MG PO TABS: 20.0000 mg | ORAL_TABLET | Freq: Every day | ORAL | Status: DC

## 2014-01-19 MED ORDER — ARIPIPRAZOLE 15 MG PO TABS: 15.0000 mg | ORAL_TABLET | Freq: Every day | ORAL | Status: DC
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
## 2014-03-16 ENCOUNTER — Other Ambulatory Visit (HOSPITAL_COMMUNITY): Payer: Self-pay | Admitting: Psychiatry

## 2014-03-23 ENCOUNTER — Ambulatory Visit (INDEPENDENT_AMBULATORY_CARE_PROVIDER_SITE_OTHER): Payer: Commercial Managed Care - HMO | Admitting: Psychiatry

## 2014-03-23 ENCOUNTER — Encounter (HOSPITAL_COMMUNITY): Payer: Self-pay | Admitting: Psychiatry

## 2014-03-23 VITALS — BP 123/73 | HR 81 | Ht 63.75 in | Wt 275.2 lb

## 2014-03-23 DIAGNOSIS — F332 Major depressive disorder, recurrent severe without psychotic features: Secondary | ICD-10-CM

## 2014-03-23 DIAGNOSIS — F411 Generalized anxiety disorder: Secondary | ICD-10-CM

## 2014-03-23 DIAGNOSIS — F4310 Post-traumatic stress disorder, unspecified: Secondary | ICD-10-CM

## 2014-03-23 DIAGNOSIS — F4010 Social phobia, unspecified: Secondary | ICD-10-CM | POA: Insufficient documentation

## 2014-03-23 MED ORDER — ARIPIPRAZOLE 15 MG PO TABS: 15.0000 mg | ORAL_TABLET | Freq: Every day | ORAL | Status: DC

## 2014-03-23 MED ORDER — PAROXETINE HCL 40 MG PO TABS: 40.0000 mg | ORAL_TABLET | Freq: Every day | ORAL | Status: DC
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
## 2014-04-13 ENCOUNTER — Other Ambulatory Visit (HOSPITAL_COMMUNITY): Payer: Self-pay | Admitting: Psychiatry

## 2014-04-19 ENCOUNTER — Telehealth (HOSPITAL_COMMUNITY): Payer: Self-pay

## 2014-04-19 NOTE — Telephone Encounter (Signed)
Called patient back after a message was left stating she had gotten a call form [employer] requesting she consider cheaper alternatives to Paxil and Abilify.  Patient stated she really did not want to even consider changing the Paxil nor the Abilify at this time unless it became necessary.  Informed patient at this point no prior authorizations had been received for patient to continue medications and she stated desire not to change if not necessary.  States [employer] will cover medications currently.  Informed patient if this changed or she had any problems filling medications to have her pharmacy contact us with a prior authorization if needed.  Patient agreed with plan and wants to continue current medications at this time. Denies any current symptoms or concerns.

## 2014-05-20 ENCOUNTER — Other Ambulatory Visit (HOSPITAL_COMMUNITY): Payer: Self-pay | Admitting: Psychiatry

## 2014-06-22 ENCOUNTER — Encounter (HOSPITAL_COMMUNITY): Payer: Self-pay | Admitting: Psychiatry

## 2014-06-22 ENCOUNTER — Ambulatory Visit (INDEPENDENT_AMBULATORY_CARE_PROVIDER_SITE_OTHER): Payer: Commercial Managed Care - HMO | Admitting: Psychiatry

## 2014-06-22 VITALS — BP 118/83 | HR 81 | Ht 66.0 in | Wt 268.8 lb

## 2014-06-22 DIAGNOSIS — F332 Major depressive disorder, recurrent severe without psychotic features: Secondary | ICD-10-CM | POA: Diagnosis not present

## 2014-06-22 DIAGNOSIS — F4010 Social phobia, unspecified: Secondary | ICD-10-CM | POA: Diagnosis not present

## 2014-06-22 DIAGNOSIS — Z79899 Other long term (current) drug therapy: Secondary | ICD-10-CM

## 2014-06-22 DIAGNOSIS — F4310 Post-traumatic stress disorder, unspecified: Secondary | ICD-10-CM

## 2014-06-22 DIAGNOSIS — F411 Generalized anxiety disorder: Secondary | ICD-10-CM

## 2014-06-22 MED ORDER — ARIPIPRAZOLE 15 MG PO TABS: 15.0000 mg | ORAL_TABLET | Freq: Every day | ORAL | Status: DC

## 2014-06-22 MED ORDER — PAROXETINE HCL 40 MG PO TABS: 60.0000 mg | ORAL_TABLET | Freq: Every day | ORAL | Status: DC
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
## 2014-06-22 NOTE — Patient Instructions (Signed)
1. Call [phone] to schedule EKG

## 2014-06-30 ENCOUNTER — Other Ambulatory Visit: Payer: Self-pay

## 2014-06-30 DIAGNOSIS — Z1231 Encounter for screening mammogram for malignant neoplasm of breast: Secondary | ICD-10-CM

## 2014-07-01 ENCOUNTER — Ambulatory Visit (HOSPITAL_COMMUNITY)
Admission: RE | Admit: 2014-07-01 | Discharge: 2014-07-01 | Disposition: A | Discharge status: Home / Self Care | Payer: Commercial Managed Care - HMO | Source: Ambulatory Visit | Attending: Psychiatry | Admitting: Psychiatry

## 2014-07-01 DIAGNOSIS — Z5181 Encounter for therapeutic drug level monitoring: Secondary | ICD-10-CM | POA: Insufficient documentation

## 2014-07-01 DIAGNOSIS — Z79899 Other long term (current) drug therapy: Secondary | ICD-10-CM | POA: Diagnosis not present

## 2014-07-01 DIAGNOSIS — R9431 Abnormal electrocardiogram [ECG] [EKG]: Secondary | ICD-10-CM | POA: Diagnosis not present

## 2014-07-07 ENCOUNTER — Encounter: Payer: Self-pay | Admitting: Neurology

## 2014-07-27 ENCOUNTER — Ambulatory Visit
Admission: RE | Admit: 2014-07-27 | Discharge: 2014-07-27 | Disposition: A | Discharge status: Home / Self Care | Payer: Medicare HMO | Source: Ambulatory Visit

## 2014-07-27 DIAGNOSIS — Z1231 Encounter for screening mammogram for malignant neoplasm of breast: Secondary | ICD-10-CM

## 2014-08-10 ENCOUNTER — Other Ambulatory Visit (HOSPITAL_COMMUNITY): Payer: Self-pay | Admitting: Psychiatry

## 2014-08-24 ENCOUNTER — Ambulatory Visit (INDEPENDENT_AMBULATORY_CARE_PROVIDER_SITE_OTHER): Payer: Medicare HMO | Admitting: Psychiatry

## 2014-08-24 ENCOUNTER — Encounter (HOSPITAL_COMMUNITY): Payer: Self-pay | Admitting: Psychiatry

## 2014-08-24 VITALS — BP 121/78 | HR 80 | Ht 65.0 in | Wt 263.8 lb

## 2014-08-24 DIAGNOSIS — F4310 Post-traumatic stress disorder, unspecified: Secondary | ICD-10-CM | POA: Diagnosis not present

## 2014-08-24 DIAGNOSIS — F4010 Social phobia, unspecified: Secondary | ICD-10-CM | POA: Diagnosis not present

## 2014-08-24 DIAGNOSIS — F411 Generalized anxiety disorder: Secondary | ICD-10-CM

## 2014-08-24 DIAGNOSIS — F332 Major depressive disorder, recurrent severe without psychotic features: Secondary | ICD-10-CM

## 2014-08-24 MED ORDER — ARIPIPRAZOLE 15 MG PO TABS: ORAL_TABLET | ORAL | Status: DC

## 2014-08-24 MED ORDER — PAROXETINE HCL 40 MG PO TABS: ORAL_TABLET | ORAL | Status: DC
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
## 2014-11-24 ENCOUNTER — Emergency Department (HOSPITAL_COMMUNITY)
Admission: EM | Admit: 2014-11-24 | Discharge: 2014-11-25 | Disposition: A | Discharge status: Home / Self Care | Payer: Medicare HMO | Attending: Emergency Medicine | Admitting: Emergency Medicine

## 2014-11-24 ENCOUNTER — Emergency Department (HOSPITAL_COMMUNITY): Payer: Medicare HMO

## 2014-11-24 ENCOUNTER — Encounter (HOSPITAL_COMMUNITY): Payer: Self-pay | Admitting: *Deleted

## 2014-11-24 DIAGNOSIS — F419 Anxiety disorder, unspecified: Secondary | ICD-10-CM | POA: Insufficient documentation

## 2014-11-24 DIAGNOSIS — G8929 Other chronic pain: Secondary | ICD-10-CM | POA: Insufficient documentation

## 2014-11-24 DIAGNOSIS — R002 Palpitations: Secondary | ICD-10-CM | POA: Diagnosis not present

## 2014-11-24 DIAGNOSIS — N390 Urinary tract infection, site not specified: Secondary | ICD-10-CM | POA: Diagnosis not present

## 2014-11-24 DIAGNOSIS — Z862 Personal history of diseases of the blood and blood-forming organs and certain disorders involving the immune mechanism: Secondary | ICD-10-CM | POA: Diagnosis not present

## 2014-11-24 DIAGNOSIS — N289 Disorder of kidney and ureter, unspecified: Secondary | ICD-10-CM | POA: Diagnosis not present

## 2014-11-24 DIAGNOSIS — Z7982 Long term (current) use of aspirin: Secondary | ICD-10-CM | POA: Insufficient documentation

## 2014-11-24 DIAGNOSIS — E039 Hypothyroidism, unspecified: Secondary | ICD-10-CM | POA: Insufficient documentation

## 2014-11-24 DIAGNOSIS — E119 Type 2 diabetes mellitus without complications: Secondary | ICD-10-CM | POA: Diagnosis not present

## 2014-11-24 DIAGNOSIS — Z79899 Other long term (current) drug therapy: Secondary | ICD-10-CM | POA: Diagnosis not present

## 2014-11-24 DIAGNOSIS — G4733 Obstructive sleep apnea (adult) (pediatric): Secondary | ICD-10-CM | POA: Diagnosis not present

## 2014-11-24 DIAGNOSIS — R42 Dizziness and giddiness: Secondary | ICD-10-CM | POA: Diagnosis present

## 2014-11-24 DIAGNOSIS — M797 Fibromyalgia: Secondary | ICD-10-CM | POA: Diagnosis not present

## 2014-11-24 DIAGNOSIS — J45909 Unspecified asthma, uncomplicated: Secondary | ICD-10-CM | POA: Insufficient documentation

## 2014-11-24 DIAGNOSIS — E669 Obesity, unspecified: Secondary | ICD-10-CM | POA: Diagnosis not present

## 2014-11-24 DIAGNOSIS — F329 Major depressive disorder, single episode, unspecified: Secondary | ICD-10-CM | POA: Insufficient documentation

## 2014-11-24 DIAGNOSIS — Z9981 Dependence on supplemental oxygen: Secondary | ICD-10-CM | POA: Diagnosis not present

## 2014-11-24 DIAGNOSIS — I10 Essential (primary) hypertension: Secondary | ICD-10-CM | POA: Diagnosis not present

## 2014-11-24 LAB — URINALYSIS, ROUTINE W REFLEX MICROSCOPIC
BILIRUBIN URINE: NEGATIVE
Glucose, UA: NEGATIVE mg/dL
Hgb urine dipstick: NEGATIVE
KETONES UR: NEGATIVE mg/dL
Nitrite: NEGATIVE
PH: 6 (ref 5.0–8.0)
PROTEIN: NEGATIVE mg/dL
Specific Gravity, Urine: 1.015 (ref 1.005–1.030)
UROBILINOGEN UA: 0.2 mg/dL (ref 0.0–1.0)

## 2014-11-24 LAB — CBC
HCT: 36.3 % (ref 36.0–46.0)
Hemoglobin: 11.9 g/dL — ABNORMAL LOW (ref 12.0–15.0)
MCH: 28.5 pg (ref 26.0–34.0)
MCHC: 32.8 g/dL (ref 30.0–36.0)
MCV: 87.1 fL (ref 78.0–100.0)
PLATELETS: 270 10*3/uL (ref 150–400)
RBC: 4.17 MIL/uL (ref 3.87–5.11)
RDW: 15.1 % (ref 11.5–15.5)
WBC: 9.4 10*3/uL (ref 4.0–10.5)

## 2014-11-24 LAB — URINE MICROSCOPIC-ADD ON

## 2014-11-24 LAB — BASIC METABOLIC PANEL
Anion gap: 10 (ref 5–15)
BUN: 11 mg/dL (ref 6–20)
CALCIUM: 9.5 mg/dL (ref 8.9–10.3)
CO2: 27 mmol/L (ref 22–32)
Chloride: 102 mmol/L (ref 101–111)
Creatinine, Ser: 1.2 mg/dL — ABNORMAL HIGH (ref 0.44–1.00)
GFR, EST AFRICAN AMERICAN: 60 mL/min — AB (ref >60)
GFR, EST NON AFRICAN AMERICAN: 51 mL/min — AB (ref >60)
Glucose, Bld: 105 mg/dL — ABNORMAL HIGH (ref 65–99)
Potassium: 3.4 mmol/L — ABNORMAL LOW (ref 3.5–5.1)
SODIUM: 139 mmol/L (ref 135–145)

## 2014-11-24 LAB — CBG MONITORING, ED: Glucose-Capillary: 121 mg/dL — ABNORMAL HIGH (ref 65–99)

## 2014-11-24 NOTE — ED Provider Notes (Signed)
CSN: [id number]     Arrival date & time 11/24/14  1902 History   First MD Initiated Contact with Patient 11/24/14 2312     Chief Complaint  Patient presents with  . Atrial Fibrillation  . Dizziness     (Consider location/radiation/quality/duration/timing/severity/associated sxs/prior Treatment) HPI Comments: Pt comes in with c/o palpitation and dizziness for the last 2 months. She states that he has been seen by cardiology, opthomology and pcp without any answers. She states in the last week the symptoms seems to be worse. No cp or sob. Dizziness is feeling near syncopal in nature. No fever, cough, n/v/d. She does have some urgency and frequency. Pt states that she hasn't had an medication changes recently. She states that with the symptoms that she is having she thinks she is having atrial fibrillation. She has never had an ekg showing atrial fibrillation  The history is provided by the patient. No language interpreter was used.    Past Medical History  Diagnosis Date  . Hypertension   . Obesity   . Depression   . Anxiety   . PTSD (post-traumatic stress disorder)   . Hypothyroidism   . Anemia   . Fibromyalgia   . Sleep apnea   . GERD (gastroesophageal reflux disease)   . OSA on CPAP 01/07/2013  . MDD (major depressive disorder) 01/07/2013  . Daytime somnolence 01/07/2013  . Chronic pain disorder   . Sickle cell trait   . Asthma   . Seasonal allergies   . Diabetes mellitus, type II    Past Surgical History  Procedure Laterality Date  . No past surgeries     Family History  Problem Relation Age of Onset  . Bipolar disorder Sister   . Suicidality Neg Hx    Social History  Substance Use Topics  . Smoking status: Never Smoker   . Smokeless tobacco: Never Used  . Alcohol Use: No   OB History    No data available     Review of Systems  All other systems reviewed and are negative.     Allergies  Benztropine; Cyclobenzaprine; Lactose intolerance (gi);  Lamotrigine; Latex; Lisinopril; Topamax; Tramadol; and Phenylalanine  Home Medications   Prior to Admission medications   Medication Sig Start Date End Date Taking? Authorizing Provider  acetaminophen (TYLENOL) 500 MG tablet 1,000 mg.    Historical Provider, MD  albuterol (PROVENTIL HFA;VENTOLIN HFA) 108 (90 BASE) MCG/ACT inhaler Inhale 2 puffs into the lungs 4 (four) times daily.    Historical Provider, MD  AMLODIPINE BESYLATE PO Take by mouth.    Historical Provider, MD  ARIPiprazole (ABILIFY) 15 MG tablet TAKE 1 TABLET(15 MG) BY MOUTH DAILY 08/24/14   Charlcie Cradle, MD  aspirin 81 MG tablet Take 81 mg by mouth daily.    Historical Provider, MD  b complex vitamins tablet Take 1 tablet by mouth daily.    Historical Provider, MD  baclofen (LIORESAL) 10 MG tablet Take 10 mg by mouth 3 (three) times daily.  02/04/13   Historical Provider, MD  buprenorphine (BUTRANS - DOSED MCG/HR) 10 MCG/HR PTWK patch Place 10 mcg onto the skin once a week.    Historical Provider, MD  Cholecalciferol (VITAMIN D3) 1000 UNITS CAPS Take 2 tablets by mouth daily. 06/18/11   Historical Provider, MD  cloNIDine (CATAPRES) 0.1 MG tablet Take 1 tablet by mouth 2 (two) times daily. 1 TAB IN THE AM 2 TABS IN THE PM 10/31/12   Historical Provider, MD  econazole nitrate 1 %  cream Apply 1 application topically daily.    Historical Provider, MD  fexofenadine (ALLEGRA) 180 MG tablet Take 180 mg by mouth daily.    Historical Provider, MD  furosemide (LASIX) 20 MG tablet Take 1 tablet (20 mg total) by mouth 2 (two) times daily. 02/06/13   Kennyth Arnold, FNP  Gabapentin Enacarbil (HORIZANT) 600 MG TBCR Take 600 mg by mouth 2 (two) times daily.    Historical Provider, MD  Garlic 8182 MG CAPS Take 5 tablets by mouth daily.    Historical Provider, MD  guaifenesin (HUMIBID E) 400 MG TABS tablet Take 400 mg by mouth. 03/22/13   Historical Provider, MD  Lactobacillus (PROBIOTIC ACIDOPHILUS) TABS Take 2 tablets by mouth daily. 01/08/13    Historical Provider, MD  levothyroxine (SYNTHROID, LEVOTHROID) 50 MCG tablet Take 50 mcg by mouth daily. 06/23/12   Historical Provider, MD  Melatonin 1 MG SUBL Place 1 strip under the tongue at bedtime. 12/31/12   Historical Provider, MD  metFORMIN (GLUCOPHAGE) 500 MG tablet Take 500 mg by mouth 2 (two) times daily with a meal.    Historical Provider, MD  mometasone-formoterol (DULERA) 200-5 MCG/ACT AERO Inhale 2 puffs into the lungs 2 (two) times daily.    Historical Provider, MD  montelukast (SINGULAIR) 10 MG tablet Take 1 tablet by mouth daily. 12/25/12   Historical Provider, MD  Multiple Vitamins-Minerals (THERA-M) TABS Take 1 tablet by mouth daily.    Historical Provider, MD  PARoxetine (PAXIL) 40 MG tablet TAKE 1 AND 1/2 TABLETS(60 MG) BY MOUTH DAILY 08/24/14   Charlcie Cradle, MD  potassium bicarbonate (K-LYTE) 25 MEQ disintegrating tablet Take 50 mEq by mouth 2 (two) times daily.    Historical Provider, MD  ranitidine (ZANTAC) 150 MG tablet Take 150 mg by mouth 2 (two) times daily.    Historical Provider, MD  valsartan (DIOVAN) 80 MG tablet Take 1 tablet by mouth daily. 12/29/12   Historical Provider, MD   BP 127/69 mmHg  Pulse 77  Temp(Src) 98.5 F (36.9 C) (Oral)  Resp 13  SpO2 97% Physical Exam  Constitutional: She is oriented to person, place, and time. She appears well-developed and well-nourished.  HENT:  Head: Normocephalic and atraumatic.  Right Ear: External ear normal.  Left Ear: External ear normal.  Eyes: Conjunctivae and EOM are normal.  Cardiovascular: Normal rate and regular rhythm.   Pulmonary/Chest: Effort normal and breath sounds normal.  Abdominal: Soft. Bowel sounds are normal. There is no tenderness.  Musculoskeletal: Normal range of motion.  Neurological: She is alert and oriented to person, place, and time. She exhibits normal muscle tone. Coordination normal.  Neg finger to nose. No pronator drift. Ambulatory without assistance  Skin: Skin is warm and dry.   Nursing note and vitals reviewed.   ED Course  Procedures (including critical care time) Labs Review Labs Reviewed  BASIC METABOLIC PANEL - Abnormal; Notable for the following:    Potassium 3.4 (*)    Glucose, Bld 105 (*)    Creatinine, Ser 1.20 (*)    GFR calc non Af Amer 51 (*)    GFR calc Af Amer 60 (*)    All other components within normal limits  CBC - Abnormal; Notable for the following:    Hemoglobin 11.9 (*)    All other components within normal limits  URINALYSIS, ROUTINE W REFLEX MICROSCOPIC (NOT AT [facility]) - Abnormal; Notable for the following:    APPearance CLOUDY (*)    Leukocytes, UA MODERATE (*)  All other components within normal limits  CBG MONITORING, ED - Abnormal; Notable for the following:    Glucose-Capillary 121 (*)    All other components within normal limits  URINE MICROSCOPIC-ADD ON    Imaging Review Dg Chest 2 View  11/24/2014   CLINICAL DATA:  Dizziness contusion shortness of breath weakness for 8 weeks  EXAM: CHEST  2 VIEW  COMPARISON:  None.  FINDINGS: The heart size and mediastinal contours are within normal limits. Both lungs are clear. The visualized skeletal structures are unremarkable.  IMPRESSION: No active cardiopulmonary disease.   Electronically Signed   By: Skipper Cliche M.D.   On: 11/24/2014 23:43   I have personally reviewed and evaluated these images and lab results as part of my medical decision-making.  ED ECG REPORT   Date: 11/24/2014  Rate: 96  Rhythm: normal sinus rhythm  QRS Axis: normal  Intervals: normal  ST/T Wave abnormalities: nonspecific T wave changes  Conduction Disutrbances:none  Narrative Interpretation:   Old EKG Reviewed: unchanged  I have personally reviewed the EKG tracing and agree with the computerized printout as noted.   MDM   Final diagnoses:  Palpitations  Renal insufficiency  UTI (lower urinary tract infection)    Will treat for uti. Discussed with pt that she may need a holter monitor if  these symptoms continue. Doubt acs or pe at this time. Pt has had a stress test with cardiology in the last 6 months because of the symptoms.    Glendell Docker, NP 11/25/14 0102  Merrily Pew, MD 11/28/14 2106

## 2014-11-24 NOTE — ED Notes (Signed)
Pt reports intermittent A-fib and dizziness for two months. Pt reports seeing a cardiologist with an unremarkable visit. Pt denies pain, shortness of breath, n/v. Pt also reports drowsiness for two months. Pt denies starting any new medications.

## 2014-11-25 MED ORDER — CEPHALEXIN 500 MG PO CAPS: 500.0000 mg | ORAL_CAPSULE | Freq: Two times a day (BID) | ORAL | Status: DC

## 2014-11-25 NOTE — Discharge Instructions (Signed)
Cardiac Event Monitoring A cardiac event monitor is a small recording device used to help detect abnormal heart rhythms (arrhythmias). The monitor is used to record heart rhythm when noticeable symptoms such as the following occur:  Fast heartbeats (palpitations), such as heart racing or fluttering.  Dizziness.  Fainting or light-headedness.  Unexplained weakness. The monitor is wired to two electrodes placed on your chest. Electrodes are flat, sticky disks that attach to your skin. The monitor can be worn for up to 30 days. You will wear the monitor at all times, except when bathing.  HOW TO USE YOUR CARDIAC EVENT MONITOR A technician will prepare your chest for the electrode placement. The technician will show you how to place the electrodes, how to work the monitor, and how to replace the batteries. Take time to practice using the monitor before you leave the office. Make sure you understand how to send the information from the monitor to your health care provider. This requires a telephone with a landline, not a cell phone. You need to:  Wear your monitor at all times, except when you are in water:  Do not get the monitor wet.  Take the monitor off when bathing. Do not swim or use a hot tub with it on.  Keep your skin clean. Do not put body lotion or moisturizer on your chest.  Change the electrodes daily or any time they stop sticking to your skin. You might need to use tape to keep them on.  It is possible that your skin under the electrodes could become irritated. To keep this from happening, try to put the electrodes in slightly different places on your chest. However, they must remain in the area under your left breast and in the upper right section of your chest.  Make sure the monitor is safely clipped to your clothing or in a location close to your body that your health care provider recommends.  Press the button to record when you feel symptoms of heart trouble, such as  dizziness, weakness, light-headedness, palpitations, thumping, shortness of breath, unexplained weakness, or a fluttering or racing heart. The monitor is always on and records what happened slightly before you pressed the button, so do not worry about being too late to get good information.  Keep a diary of your activities, such as walking, doing chores, and taking medicine. It is especially important to note what you were doing when you pushed the button to record your symptoms. This will help your health care provider determine what might be contributing to your symptoms. The information stored in your monitor will be reviewed by your health care provider alongside your diary entries.  Send the recorded information as recommended by your health care provider. It is important to understand that it will take some time for your health care provider to process the results.  Change the batteries as recommended by your health care provider. SEEK IMMEDIATE MEDICAL CARE IF:   You have chest pain.  You have extreme difficulty breathing or shortness of breath.  You develop a very fast heartbeat that persists.  You develop dizziness that does not go away.  You faint or constantly feel you are about to faint. Document Released: 12/13/2007 Document Revised: 07/20/2013 Document Reviewed: 09/01/2012 [facility] Patient Information 2015 [place], [place]. This information is not intended to replace advice given to you by your health care provider. Make sure you discuss any questions you have with your health care provider.  Urinary Tract Infection Urinary tract  infections (UTIs) can develop anywhere along your urinary tract. Your urinary tract is your body's drainage system for removing wastes and extra water. Your urinary tract includes two kidneys, two ureters, a bladder, and a urethra. Your kidneys are a pair of bean-shaped organs. Each kidney is about the size of your fist. They are located below your ribs,  one on each side of your spine. CAUSES Infections are caused by microbes, which are microscopic organisms, including fungi, viruses, and bacteria. These organisms are so small that they can only be seen through a microscope. Bacteria are the microbes that most commonly cause UTIs. SYMPTOMS  Symptoms of UTIs may vary by age and gender of the patient and by the location of the infection. Symptoms in young women typically include a frequent and intense urge to urinate and a painful, burning feeling in the bladder or urethra during urination. Older women and men are more likely to be tired, shaky, and weak and have muscle aches and abdominal pain. A fever may mean the infection is in your kidneys. Other symptoms of a kidney infection include pain in your back or sides below the ribs, nausea, and vomiting. DIAGNOSIS To diagnose a UTI, your caregiver will ask you about your symptoms. Your caregiver also will ask to provide a urine sample. The urine sample will be tested for bacteria and white blood cells. White blood cells are made by your body to help fight infection. TREATMENT  Typically, UTIs can be treated with medication. Because most UTIs are caused by a bacterial infection, they usually can be treated with the use of antibiotics. The choice of antibiotic and length of treatment depend on your symptoms and the type of bacteria causing your infection. HOME CARE INSTRUCTIONS  If you were prescribed antibiotics, take them exactly as your caregiver instructs you. Finish the medication even if you feel better after you have only taken some of the medication.  Drink enough water and fluids to keep your urine clear or pale yellow.  Avoid caffeine, tea, and carbonated beverages. They tend to irritate your bladder.  Empty your bladder often. Avoid holding urine for long periods of time.  Empty your bladder before and after sexual intercourse.  After a bowel movement, women should cleanse from front to  back. Use each tissue only once. SEEK MEDICAL CARE IF:   You have back pain.  You develop a fever.  Your symptoms do not begin to resolve within 3 days. SEEK IMMEDIATE MEDICAL CARE IF:   You have severe back pain or lower abdominal pain.  You develop chills.  You have nausea or vomiting.  You have continued burning or discomfort with urination. MAKE SURE YOU:   Understand these instructions.  Will watch your condition.  Will get help right away if you are not doing well or get worse. Document Released: 12/13/2004 Document Revised: 09/04/2011 Document Reviewed: 04/13/2011 [facility] Patient Information 2015 [place], [place]. This information is not intended to replace advice given to you by your health care provider. Make sure you discuss any questions you have with your health care provider.

## 2014-11-30 ENCOUNTER — Ambulatory Visit (INDEPENDENT_AMBULATORY_CARE_PROVIDER_SITE_OTHER): Payer: Medicare HMO | Admitting: Neurology

## 2014-11-30 ENCOUNTER — Encounter: Payer: Self-pay | Admitting: Neurology

## 2014-11-30 ENCOUNTER — Ambulatory Visit: Payer: Commercial Managed Care - HMO | Admitting: Neurology

## 2014-11-30 VITALS — BP 102/68 | HR 78 | Resp 16 | Ht 66.0 in | Wt 269.0 lb

## 2014-11-30 DIAGNOSIS — F39 Unspecified mood [affective] disorder: Secondary | ICD-10-CM | POA: Diagnosis not present

## 2014-11-30 DIAGNOSIS — Z9989 Dependence on other enabling machines and devices: Principal | ICD-10-CM

## 2014-11-30 DIAGNOSIS — G4733 Obstructive sleep apnea (adult) (pediatric): Secondary | ICD-10-CM

## 2014-11-30 NOTE — Patient Instructions (Addendum)
Please continue using your CPAP regularly. While your insurance requires that you use CPAP at least 4 hours each night on 70% of the nights, I recommend, that you not skip any nights and use it throughout the night if you can. Getting used to CPAP and staying with the treatment long term does take time and patience and discipline. Untreated obstructive sleep apnea when it is moderate to severe can have an adverse impact on cardiovascular health and raise her risk for heart disease, arrhythmias, hypertension, congestive heart failure, stroke and diabetes. Untreated obstructive sleep apnea causes sleep disruption, nonrestorative sleep, and sleep deprivation. This can have an impact on your day to day functioning and cause daytime sleepiness and impairment of cognitive function, memory loss, mood disturbance, and problems focussing. Using CPAP regularly can improve these symptoms.  We will increase your pressure to 13 cm at this time.   Follow up in 1 year, I will ask your DME company for a 30 day download from the machine, your leak is also too high.   Please call us in a month or email me through My Chart in about 6 weeks so I can look at your current download.

## 2014-11-30 NOTE — Progress Notes (Signed)
Subjective:    Patient ID: Abigail Mendez is a 49 y.o. female.  HPI     Interim history:   Abigail Mendez is a 49 year old right-handed woman with an underlying complex medical history of hypertension, obesity, FMS with chronic pain, MDD, PTSD, asthma, sickle cell trait, chronic fatigue, IBS, OA, GERD, CTS, and allergies, who presents for followup consultation of her OSA. She is unaccompanied today. I last saw on 11/30/2013, at which time she reported trying Vistaril 25 mg to 50 mg per psychiatrist. She had been tried on Ambien which did not help and she tried melatonin which also did not help. She reported a fall in May 2000 2015 and this resulted in a black eye. She reported that she went to [facility] and had a head CT which per her report was negative. She was no longer on Effexor was on a higher dose of Abilify and had been started on lithium. She reported full compliance with CPAP therapy indicating that it was helping her sleep and her daytime somnolence as well as sleep quality. She still had nocturia about 4 times each night and reported weight gain.  Today, 11/30/2014: I reviewed her CPAP compliance data from 10/29/2014 through 11/27/2014 which is a total of 30 days during which time she used her machine 30 days with percent used days greater than 4 hours at 80%, indicating very good compliance with an average usage of 5 hours and 49 minutes, residual AHI high at 37.2 per hour, leak I with the 95th percentile at 30.4 L/m on a pressure of 12 cm with EPR of 2. Her residual AHI seems to be high secondary to obstructive apneic events.   Today, 11/30/2014: She reports doing okay. Of note, she presented to the emergency room on 11/24/2014 with palpitations and subjective concern for A. fib. Her EKG was in keeping with sinus rhythm. I reviewed the emergency room records. She was diagnosed with a urinary tract infection and was advised to monitor his symptoms and follow-up with  cardiology for potential Holter monitor. She sees a cardiologist in [facility]. She is on generic Paxil 60 mg daily and no longer on vistaril. She sees multiple specialists. She sees pain management. She has an upcoming appointment with cardiology. Her weight fluctuates. Around this time last year she weighed 10 pounds more. At the time of her sleep study she weighed about 280.  Previously:   I saw her on 05/27/2013, at which time she was compliant with CPAP therapy but reported residual daytime symptoms including non-restorative sleep, sleep disruption and problems achieving and maintaining sleep. This had been a chronic problem. I talked to her about maintaining good sleep hygiene. I advised her against trying additional psychotropic or prescription sleep aids but suggested that she could try melatonin.   I reviewed her compliance data from 09/01/2013 through 11/29/2013 which is the last 90 days during which time she used her machine every night. Percent used days greater than 4 hours was 89%, indicating very good compliance. Average usage was 6 hours and 25 minutes. Residual AHI was low at 0.9 per hour, leak was acceptable at 15.5 L per minute at the 95th percentile. She shares a letter with me that she got from her insurance provider. She says that she may not be able to keep her CPAP machine as her insurance is no longer covering it. She says that she never had a face-to-face after her original sleep study or before her original sleep study which  was in January of last year. I first met her in October of last year after she needed to switch sleep physicians after an insurance change, as understand. I have reviewed her sleep studies from January and February of last year and she has always been compliant with treatment.   I first met her on 01/07/2013 at the request of her primary care physician. She was diagnosed with OSA in 1/14. She reported a change in her insurance coverage and need to change her  DME company as well as her sleep specialist. I reviewed her sleep study from 04/17/2012 which showed a sleep efficiency of 70.9%. Sleep latency was 32 minutes and REM latency was increased at 306.5 minutes. She had a decrease in rem sleep. She had a total AHI of 20 per hour. Her baseline oxygen saturation was 98% while awake. Oxygen nadir was 77%. Time below 88% saturation was 4.6% of the study. She had a CPAP titration study on 04/22/2012 and I reviewed the test results: She was noted to have a sleep efficiency of 88.4% with a latency to sleep of 25 minutes and REM latency of 77.6 minutes. She had an increased percentage of REM sleep. Baseline oxygen saturation while awake was 98%, lowest asleep oxygen saturation was 80%. Time below 88% saturation was 4.6% of the study. Her overall AHI was reduced at 3.2 per hour. She had a mild increase in periodic leg movements at 7.6 per hour, with an associated arousal index of 0 per hour. She was titrated on CPAP from 5-12 cm.   She was placed on CPAP in 2/14 and I reviewed the patient's CPAP compliance data from 10/09/12 to 01/06/13 (90 days), during which time the patient used CPAP every day except for 2 days. The average usage for all days was 8 hours and 6 minutes. The percent used days greater than 4 hours was 93%, indicating excellent compliance. The residual AHI was 0.4 per hour, indicating an appropriate treatment pressure of 12 cwp with EPR of 3.   At the time of her first appointment with me she indicated full compliance with CPAP but still had residual issues with nonrestorative sleep and daytime tiredness. Her ESS was 16/24. She has tried improving her sleep hygiene. She had gained wt on Lyrica and stopped it. She also stopped her Wellbutrin, Zoloft, and Abilify on her own. She has been taking hydrocodone and robaxin for FMS. She has not yet seen a pain specialist, but is in the process of getting a referral. She sees Dr. Estanislado Pandy in rheumatology. She used to go  to [facility], but stopped going there on 12/25/12. I suggested that she continue using CPAP regularly. I also talked to her about potentially retesting her.   I reviewed her compliance data from 04/21/2013 through 05/26/2013 which is a total of 36 days during which time she is CPAP every night except for one night. Percent used days greater than 4 hours was 86%, indicating very good compliance with an average usage of 6 hours and 28 minutes. Residual AHI is 1.7 per hour with very low leak. Pressure is 12 cm with EPR of 2.   She had tried Ambien, which did not help her stay asleep, she tried Melatonin 4.5 mg, which did not help. She is on Clonidine for BP, anxiety and nightmares. She is on Baclofen in lieu of Robaxin and is on Effexor XR 150 mg, which started in November 2014. She was re-started on Abilify 10 mg qHS. She has  started seeing a pain Physician, Dr. Wess Botts, in [place]. She had been on Seroquel in the past, but gained a lot of weight.   Her Past Medical History Is Significant For: Past Medical History  Diagnosis Date  . Hypertension   . Obesity   . Depression   . Anxiety   . PTSD (post-traumatic stress disorder)   . Hypothyroidism   . Anemia   . Fibromyalgia   . Sleep apnea   . GERD (gastroesophageal reflux disease)   . OSA on CPAP 01/07/2013  . MDD (major depressive disorder) 01/07/2013  . Daytime somnolence 01/07/2013  . Chronic pain disorder   . Sickle cell trait   . Asthma   . Seasonal allergies   . Diabetes mellitus, type II     Her Past Surgical History Is Significant For: Past Surgical History  Procedure Laterality Date  . No past surgeries      Her Family History Is Significant For: Family History  Problem Relation Age of Onset  . Bipolar disorder Sister   . Suicidality Neg Hx     Her Social History Is Significant For: Social History   Social History  . Marital Status: Single    Spouse Name: N/A  . Number of Children: N/A  . Years of  Education: N/A   Social History Main Topics  . Smoking status: Never Smoker   . Smokeless tobacco: Never Used  . Alcohol Use: No  . Drug Use: No  . Sexual Activity: No   Other Topics Concern  . None   Social History Narrative    Her Allergies Are:  Allergies  Allergen Reactions  . Benztropine Nausea And Vomiting  . Cyclobenzaprine     Hallucinations  . Lactose Intolerance (Gi) Diarrhea  . Lamotrigine     'fall', balance problems  . Latex     Redness,swelling,itching, localized pain  . Lisinopril     cough  . Topamax [Topiramate] Other (See Comments)    Burning in hands and feet  . Tramadol Nausea And Vomiting  . Phenylalanine Rash  :   Her Current Medications Are:  Outpatient Encounter Prescriptions as of 11/30/2014  Medication Sig  . acetaminophen (TYLENOL) 500 MG tablet 1,000 mg.  . albuterol (PROVENTIL HFA;VENTOLIN HFA) 108 (90 BASE) MCG/ACT inhaler Inhale 2 puffs into the lungs 4 (four) times daily.  Marland Kitchen AMLODIPINE BESYLATE PO Take by mouth.  . ARIPiprazole (ABILIFY) 15 MG tablet TAKE 1 TABLET(15 MG) BY MOUTH DAILY  . aspirin 81 MG tablet Take 81 mg by mouth daily.  Marland Kitchen b complex vitamins tablet Take 1 tablet by mouth daily.  . baclofen (LIORESAL) 10 MG tablet Take 10 mg by mouth 3 (three) times daily.   . buprenorphine (BUTRANS - DOSED MCG/HR) 10 MCG/HR PTWK patch Place 10 mcg onto the skin once a week.  . cephALEXin (KEFLEX) 500 MG capsule Take 1 capsule (500 mg total) by mouth 2 (two) times daily.  . Cholecalciferol (VITAMIN D3) 1000 UNITS CAPS Take 2 tablets by mouth daily.  . cloNIDine (CATAPRES) 0.1 MG tablet Take 1 tablet by mouth 2 (two) times daily. 1 TAB IN THE AM 2 TABS IN THE PM  . econazole nitrate 1 % cream Apply 1 application topically daily.  . fexofenadine (ALLEGRA) 180 MG tablet Take 180 mg by mouth daily.  . furosemide (LASIX) 20 MG tablet Take 1 tablet (20 mg total) by mouth 2 (two) times daily.  . Garlic 4562 MG CAPS Take 5 tablets by mouth  daily.  . guaifenesin (HUMIBID E) 400 MG TABS tablet Take 400 mg by mouth.  . Lactobacillus (PROBIOTIC ACIDOPHILUS) TABS Take 2 tablets by mouth daily.  Marland Kitchen levothyroxine (SYNTHROID, LEVOTHROID) 50 MCG tablet Take 50 mcg by mouth daily.  . Melatonin 5 MG TABS Take by mouth.  . metFORMIN (GLUCOPHAGE) 500 MG tablet Take 500 mg by mouth 2 (two) times daily with a meal.  . mometasone-formoterol (DULERA) 200-5 MCG/ACT AERO Inhale 2 puffs into the lungs 2 (two) times daily.  . montelukast (SINGULAIR) 10 MG tablet Take 1 tablet by mouth daily.  . Multiple Vitamins-Minerals (THERA-M) TABS Take 1 tablet by mouth daily.  Marland Kitchen PARoxetine (PAXIL) 40 MG tablet TAKE 1 AND 1/2 TABLETS(60 MG) BY MOUTH DAILY  . potassium bicarbonate (K-LYTE) 25 MEQ disintegrating tablet Take 50 mEq by mouth 2 (two) times daily.  . ranitidine (ZANTAC) 150 MG tablet Take 150 mg by mouth 2 (two) times daily.  . valsartan (DIOVAN) 80 MG tablet Take 1 tablet by mouth daily.  . [DISCONTINUED] Gabapentin Enacarbil (HORIZANT) 600 MG TBCR Take 600 mg by mouth 2 (two) times daily.  . [DISCONTINUED] Melatonin 1 MG SUBL Place 1 strip under the tongue at bedtime.   No facility-administered encounter medications on file as of 11/30/2014.  :  Review of Systems:  Out of a complete 14 point review of systems, all are reviewed and negative with the exception of these symptoms as listed below:   Review of Systems  Constitutional:       Patient reports being in the hospital this past Wednesday for UTI and dizziness. Patient to see Cardiologist on Monday for further evaluation. Also has appt with Neurologist Dr. Cammy Brochure.   Neurological: Positive for dizziness.       Patient feels that she is doing well on CPAP. No complaints or concerns.     Objective:  Neurologic Exam  Physical Exam Physical Examination:   Filed Vitals:   11/30/14 1348  BP: 102/68  Pulse: 78  Resp: 16   General Examination: The patient is a very pleasant 49 y.o.  female in no acute distress. She appears well-developed and well-nourished and well groomed. She is obese and has lost some weight.   HEENT: Normocephalic, atraumatic, pupils are equal, round and reactive to light and accommodation. Extraocular tracking is good without limitation to gaze excursion or nystagmus noted. Normal smooth pursuit is noted. Hearing is grossly intact. Face is symmetric with normal facial animation and normal facial sensation. Speech is clear with no dysarthria noted. There is no hypophonia. There is no lip, neck/head, jaw or voice tremor. Neck is supple with full range of passive and active motion. There are no carotid bruits on auscultation. Oropharynx exam reveals: mild mouth dryness, adequate dental hygiene and moderate airway crowding, due to narrow airway, tonsils of 2+ and larger tongue. Mallampati is class III. Tongue protrudes centrally and palate elevates symmetrically. Neck size is 18 inches.   Chest: Clear to auscultation without wheezing, rhonchi or crackles noted.  Heart: S1+S2+0, regular and normal without murmurs, rubs or gallops noted.   Abdomen: Soft, non-tender and non-distended with normal bowel sounds appreciated on auscultation.  Extremities: There is trace pitting edema in the distal lower extremities bilaterally. Pedal pulses are intact.  Skin: Warm and dry without trophic changes noted. There are no varicose veins.  Musculoskeletal: exam reveals no obvious joint deformities, tenderness or joint swelling or erythema.   Neurologically:  Mental status: The patient is awake, alert and oriented  in all 4 spheres. Her memory, attention, language and knowledge are appropriate. There is no aphasia, agnosia, apraxia or anomia. Speech is clear with normal prosody and enunciation. Thought process is linear. Mood is depressed and affect is blunted.  Cranial nerves are as described above under HEENT exam. In addition, shoulder shrug is normal with equal shoulder  height noted. Motor exam: Normal bulk, strength and tone is noted. There is no drift, tremor or rebound. Romberg is negative. Reflexes are 2+ throughout. Toes are downgoing bilaterally. Fine motor skills are intact with the exception of mild slowness in the upper and lower extremities. Sensory exam is intact to light touch throughout. She stands slowly. She is able to do tandem walk with a cane only. She walks with a cane and walks slowly and cautiously. She turns slowly.  Assessment and Plan:   In summary, Abigail Mendez is a very pleasant 49 year old female with a complex underlying medical history and a diagnosis of moderate obstructive sleep apnea in 1/14, on CPAP with full compliance and good results. She has been compliant since I met her in October 2014. However, her most recent 30 day download indicates a significantly high residual AHI at 37 per hour. Her leak has been high as well. She states that she recently got a new mask. I would like to increase her pressure to 13 cm at this time. We will also get in touch with her DME company regarding her high leak. She has actually lost about 10 pounds in the last year compared to our visit in September last year. I'm not sure how to explain the high residual AHI when last year her AHI was less than 1 per hour and leak was also much better. We will await her 30 day download after her pressure increase. She is advised to continue using CPAP regularly and is commended for her treatment adherence. I will see her back routinely in a year, but we will be in touch regarding her next download. She is also reminded to call us in about 6 weeks for an update. I spent 20 minutes in total face-to-face time with the patient, more than 50% of which was spent in counseling and coordination of care, reviewing test results, reviewing medication and discussing or reviewing the diagnosis of OSA, its prognosis and treatment options.

## 2014-12-30 ENCOUNTER — Ambulatory Visit (HOSPITAL_COMMUNITY): Payer: Self-pay | Admitting: Psychiatry

## 2015-01-10 ENCOUNTER — Telehealth: Payer: Self-pay | Admitting: Neurology

## 2015-01-10 DIAGNOSIS — G4733 Obstructive sleep apnea (adult) (pediatric): Secondary | ICD-10-CM

## 2015-01-10 DIAGNOSIS — Z9989 Dependence on other enabling machines and devices: Principal | ICD-10-CM

## 2015-01-10 NOTE — Telephone Encounter (Signed)
We need a 30 day download from her DME company. Please ask [employer] to fax it to [place], thx

## 2015-01-10 NOTE — Telephone Encounter (Signed)
Pt called to remind Dr Rexene Alberts to check records of CPAP from [place].

## 2015-01-11 ENCOUNTER — Other Ambulatory Visit: Payer: Self-pay

## 2015-01-11 DIAGNOSIS — Z9989 Dependence on other enabling machines and devices: Principal | ICD-10-CM

## 2015-01-11 DIAGNOSIS — G4733 Obstructive sleep apnea (adult) (pediatric): Secondary | ICD-10-CM

## 2015-01-11 NOTE — Telephone Encounter (Signed)
I spoke to patient to advise her of new pressure setting. I will fax new order to [facility].

## 2015-01-11 NOTE — Telephone Encounter (Signed)
Residual AHI too high. Pls enter CPAP order to increase pressure by 1 cm and request 30 day download from [facility] after setting change. thx

## 2015-01-11 NOTE — Telephone Encounter (Signed)
I placed down load on your desk

## 2015-01-25 ENCOUNTER — Encounter (HOSPITAL_COMMUNITY): Payer: Self-pay | Admitting: Psychiatry

## 2015-01-25 ENCOUNTER — Ambulatory Visit (INDEPENDENT_AMBULATORY_CARE_PROVIDER_SITE_OTHER): Payer: Medicare HMO | Admitting: Psychiatry

## 2015-01-25 VITALS — BP 112/81 | HR 80 | Ht 65.0 in | Wt 276.6 lb

## 2015-01-25 DIAGNOSIS — F332 Major depressive disorder, recurrent severe without psychotic features: Secondary | ICD-10-CM | POA: Diagnosis not present

## 2015-01-25 DIAGNOSIS — F411 Generalized anxiety disorder: Secondary | ICD-10-CM | POA: Diagnosis not present

## 2015-01-25 DIAGNOSIS — F4310 Post-traumatic stress disorder, unspecified: Secondary | ICD-10-CM

## 2015-01-25 DIAGNOSIS — F4010 Social phobia, unspecified: Secondary | ICD-10-CM

## 2015-01-25 MED ORDER — ARIPIPRAZOLE 15 MG PO TABS: ORAL_TABLET | ORAL | Status: DC

## 2015-01-25 MED ORDER — PAROXETINE HCL 40 MG PO TABS: ORAL_TABLET | ORAL | Status: DC
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
## 2015-04-04 ENCOUNTER — Other Ambulatory Visit (HOSPITAL_COMMUNITY): Payer: Self-pay | Admitting: Psychiatry

## 2015-04-20 ENCOUNTER — Telehealth: Payer: Self-pay | Admitting: Neurology

## 2015-04-20 DIAGNOSIS — G4733 Obstructive sleep apnea (adult) (pediatric): Secondary | ICD-10-CM

## 2015-04-20 DIAGNOSIS — Z9989 Dependence on other enabling machines and devices: Principal | ICD-10-CM

## 2015-04-20 NOTE — Telephone Encounter (Signed)
I have faxed orders in. I called patient and she is aware of the changes.

## 2015-04-20 NOTE — Telephone Encounter (Signed)
Download placed on your desk  

## 2015-04-20 NOTE — Telephone Encounter (Signed)
I reviewed patient's most recent CPAP compliance data from 03/21/2015 through 04/19/2015 which is a total of 30 days during which time she used her machine 27 days with compliance percentage adequate at 70%. Average usage was 4 hours and 22 minutes, residual AHI elevated at 10 per hour, mostly because of obstructive events, leak for the most part low with the 95th percentile at 5 L/m on a pressure of 15 cm with EPR of 2. I will request a pressure increased to 15 cm and we can recheck her download after 30 days.

## 2015-04-20 NOTE — Telephone Encounter (Signed)
Patient is calling to remind you to call [facility] to check the sleep numbers to see if the pressure needs to be changed on the CPAP. Please call the patient and advise.

## 2015-04-28 ENCOUNTER — Ambulatory Visit (HOSPITAL_COMMUNITY): Payer: Self-pay | Admitting: Psychiatry

## 2015-05-17 ENCOUNTER — Telehealth (HOSPITAL_COMMUNITY): Payer: Self-pay

## 2015-05-17 NOTE — Telephone Encounter (Signed)
We had not discussed it but it might be an insurance issues. I see she has [employer] and I understand we no longer have [employer] coverage.

## 2015-06-08 ENCOUNTER — Telehealth (HOSPITAL_COMMUNITY): Payer: Self-pay

## 2015-06-08 NOTE — Telephone Encounter (Signed)
Patient is calling and would like to know if you can recommend anyone in [place] to see her?he is running out of medication and has had no luck finding a new psychiatrist.

## 2015-06-30 ENCOUNTER — Other Ambulatory Visit: Payer: Self-pay

## 2015-06-30 DIAGNOSIS — Z1231 Encounter for screening mammogram for malignant neoplasm of breast: Secondary | ICD-10-CM

## 2015-08-04 ENCOUNTER — Ambulatory Visit: Payer: Self-pay

## 2015-08-11 ENCOUNTER — Ambulatory Visit
Admission: RE | Admit: 2015-08-11 | Discharge: 2015-08-11 | Disposition: A | Discharge status: Home / Self Care | Payer: Medicare HMO | Source: Ambulatory Visit

## 2015-08-11 DIAGNOSIS — Z1231 Encounter for screening mammogram for malignant neoplasm of breast: Secondary | ICD-10-CM

## 2015-08-22 ENCOUNTER — Emergency Department (HOSPITAL_COMMUNITY)
Admission: EM | Admit: 2015-08-22 | Discharge: 2015-08-22 | Disposition: A | Discharge status: Home / Self Care | Payer: Medicare HMO | Attending: Emergency Medicine | Admitting: Emergency Medicine

## 2015-08-22 DIAGNOSIS — R569 Unspecified convulsions: Secondary | ICD-10-CM | POA: Diagnosis present

## 2015-08-22 DIAGNOSIS — Y9289 Other specified places as the place of occurrence of the external cause: Secondary | ICD-10-CM | POA: Insufficient documentation

## 2015-08-22 DIAGNOSIS — E119 Type 2 diabetes mellitus without complications: Secondary | ICD-10-CM | POA: Insufficient documentation

## 2015-08-22 DIAGNOSIS — Y998 Other external cause status: Secondary | ICD-10-CM | POA: Diagnosis not present

## 2015-08-22 DIAGNOSIS — I10 Essential (primary) hypertension: Secondary | ICD-10-CM | POA: Insufficient documentation

## 2015-08-22 DIAGNOSIS — S59902A Unspecified injury of left elbow, initial encounter: Secondary | ICD-10-CM | POA: Insufficient documentation

## 2015-08-22 DIAGNOSIS — S4992XA Unspecified injury of left shoulder and upper arm, initial encounter: Secondary | ICD-10-CM | POA: Insufficient documentation

## 2015-08-22 DIAGNOSIS — Z7982 Long term (current) use of aspirin: Secondary | ICD-10-CM | POA: Insufficient documentation

## 2015-08-22 DIAGNOSIS — R55 Syncope and collapse: Secondary | ICD-10-CM | POA: Diagnosis not present

## 2015-08-22 DIAGNOSIS — R251 Tremor, unspecified: Secondary | ICD-10-CM | POA: Diagnosis not present

## 2015-08-22 DIAGNOSIS — Z8742 Personal history of other diseases of the female genital tract: Secondary | ICD-10-CM | POA: Insufficient documentation

## 2015-08-22 DIAGNOSIS — W1839XA Other fall on same level, initial encounter: Secondary | ICD-10-CM | POA: Insufficient documentation

## 2015-08-22 DIAGNOSIS — Y9389 Activity, other specified: Secondary | ICD-10-CM | POA: Insufficient documentation

## 2015-08-22 DIAGNOSIS — F4310 Post-traumatic stress disorder, unspecified: Secondary | ICD-10-CM | POA: Diagnosis not present

## 2015-08-22 DIAGNOSIS — N289 Disorder of kidney and ureter, unspecified: Secondary | ICD-10-CM | POA: Diagnosis not present

## 2015-08-22 DIAGNOSIS — E876 Hypokalemia: Secondary | ICD-10-CM | POA: Insufficient documentation

## 2015-08-22 DIAGNOSIS — Z862 Personal history of diseases of the blood and blood-forming organs and certain disorders involving the immune mechanism: Secondary | ICD-10-CM | POA: Insufficient documentation

## 2015-08-22 DIAGNOSIS — Z91040 Latex allergy status: Secondary | ICD-10-CM | POA: Insufficient documentation

## 2015-08-22 DIAGNOSIS — Z79899 Other long term (current) drug therapy: Secondary | ICD-10-CM | POA: Diagnosis not present

## 2015-08-22 DIAGNOSIS — S199XXA Unspecified injury of neck, initial encounter: Secondary | ICD-10-CM | POA: Insufficient documentation

## 2015-08-22 DIAGNOSIS — Z792 Long term (current) use of antibiotics: Secondary | ICD-10-CM | POA: Insufficient documentation

## 2015-08-22 DIAGNOSIS — J45909 Unspecified asthma, uncomplicated: Secondary | ICD-10-CM | POA: Diagnosis not present

## 2015-08-22 DIAGNOSIS — G8929 Other chronic pain: Secondary | ICD-10-CM | POA: Diagnosis not present

## 2015-08-22 DIAGNOSIS — M797 Fibromyalgia: Secondary | ICD-10-CM | POA: Diagnosis not present

## 2015-08-22 DIAGNOSIS — E039 Hypothyroidism, unspecified: Secondary | ICD-10-CM | POA: Diagnosis not present

## 2015-08-22 DIAGNOSIS — F419 Anxiety disorder, unspecified: Secondary | ICD-10-CM | POA: Insufficient documentation

## 2015-08-22 DIAGNOSIS — E669 Obesity, unspecified: Secondary | ICD-10-CM | POA: Diagnosis not present

## 2015-08-22 DIAGNOSIS — Z7984 Long term (current) use of oral hypoglycemic drugs: Secondary | ICD-10-CM | POA: Diagnosis not present

## 2015-08-22 DIAGNOSIS — Z7951 Long term (current) use of inhaled steroids: Secondary | ICD-10-CM | POA: Insufficient documentation

## 2015-08-22 DIAGNOSIS — K219 Gastro-esophageal reflux disease without esophagitis: Secondary | ICD-10-CM | POA: Diagnosis not present

## 2015-08-22 DIAGNOSIS — F329 Major depressive disorder, single episode, unspecified: Secondary | ICD-10-CM | POA: Diagnosis not present

## 2015-08-22 LAB — BASIC METABOLIC PANEL
ANION GAP: 10 (ref 5–15)
BUN: 13 mg/dL (ref 6–20)
CO2: 29 mmol/L (ref 22–32)
Calcium: 9.3 mg/dL (ref 8.9–10.3)
Chloride: 98 mmol/L — ABNORMAL LOW (ref 101–111)
Creatinine, Ser: 1.64 mg/dL — ABNORMAL HIGH (ref 0.44–1.00)
GFR, EST AFRICAN AMERICAN: 41 mL/min — AB (ref >60)
GFR, EST NON AFRICAN AMERICAN: 35 mL/min — AB (ref >60)
Glucose, Bld: 101 mg/dL — ABNORMAL HIGH (ref 65–99)
POTASSIUM: 3.2 mmol/L — AB (ref 3.5–5.1)
SODIUM: 137 mmol/L (ref 135–145)

## 2015-08-22 LAB — CBC
HEMATOCRIT: 35.5 % — AB (ref 36.0–46.0)
HEMOGLOBIN: 11.6 g/dL — AB (ref 12.0–15.0)
MCH: 27.2 pg (ref 26.0–34.0)
MCHC: 32.7 g/dL (ref 30.0–36.0)
MCV: 83.1 fL (ref 78.0–100.0)
Platelets: 302 10*3/uL (ref 150–400)
RBC: 4.27 MIL/uL (ref 3.87–5.11)
RDW: 15.1 % (ref 11.5–15.5)
WBC: 11.5 10*3/uL — AB (ref 4.0–10.5)

## 2015-08-22 LAB — CBG MONITORING, ED: GLUCOSE-CAPILLARY: 87 mg/dL (ref 65–99)

## 2015-08-22 NOTE — ED Provider Notes (Signed)
CSN: [id number]     Arrival date & time 08/22/15  1737 History   First MD Initiated Contact with Patient 08/22/15 1940     Chief Complaint  Patient presents with  . Seizures     (Consider location/radiation/quality/duration/timing/severity/associated sxs/prior Treatment) HPI   Abigail Mendez is a 49 y.o. female presents with reported seizure today, with generalized shaking. She was initially aware shaking, but then cannot recall what happened afterwards. She is unsure how long she shook, or she had a postictal phase. She did fall backwards injuring her left elbow and left shoulder. Shows that a secondary episode of near-syncope, while she was coming to the hospital. She has chronic intermittent episodes of dizziness has been evaluated by neurology, cardiology and hematology. She has an upcoming appointment with her hematologist on 07/26/2015. She states that she had an EEG done within the last 2 years. That did not show a propensity for seizures. He denies other recent illnesses, including fever, chills, nausea, vomiting, cough, shortness of breath or chest pain. The increased dose of Lasix and potassium, until yesterday because of fluid retention. There are no other known modifying factors.   Past Medical History  Diagnosis Date  . Hypertension   . Obesity   . Depression   . Anxiety   . PTSD (post-traumatic stress disorder)   . Hypothyroidism   . Anemia   . Fibromyalgia   . Sleep apnea   . GERD (gastroesophageal reflux disease)   . OSA on CPAP 01/07/2013  . MDD (major depressive disorder) ([facility]) 01/07/2013  . Daytime somnolence 01/07/2013  . Chronic pain disorder   . Sickle cell trait ([facility])   . Asthma   . Seasonal allergies   . Diabetes mellitus, type II ([facility])   . Vaginitis    Past Surgical History  Procedure Laterality Date  . No past surgeries     Family History  Problem Relation Age of Onset  . Bipolar disorder Sister   . Suicidality Neg Hx    Social History   Substance Use Topics  . Smoking status: Never Smoker   . Smokeless tobacco: Never Used  . Alcohol Use: No   OB History    No data available     Review of Systems  All other systems reviewed and are negative.     Allergies  Benztropine; Cyclobenzaprine; Lactose intolerance (gi); Lamotrigine; Latex; Lisinopril; Topamax; Tramadol; and Phenylalanine  Home Medications   Prior to Admission medications   Medication Sig Start Date End Date Taking? Authorizing Provider  acetaminophen (TYLENOL) 500 MG tablet 1,000 mg.    Historical Provider, MD  albuterol (PROVENTIL HFA;VENTOLIN HFA) 108 (90 BASE) MCG/ACT inhaler Inhale 2 puffs into the lungs 4 (four) times daily.    Historical Provider, MD  AMLODIPINE BESYLATE PO Take by mouth.    Historical Provider, MD  amoxicillin (AMOXIL) 875 MG tablet Take 875 mg by mouth 2 (two) times daily.    Historical Provider, MD  ARIPiprazole (ABILIFY) 15 MG tablet TAKE 1 TABLET(15 MG) BY MOUTH DAILY 01/25/15   Salina Agarwal, MD  aspirin 81 MG tablet Take 81 mg by mouth daily.    Historical Provider, MD  b complex vitamins tablet Take 1 tablet by mouth daily.    Historical Provider, MD  baclofen (LIORESAL) 10 MG tablet Take 10 mg by mouth 3 (three) times daily.  02/04/13   Historical Provider, MD  buprenorphine (BUTRANS - DOSED MCG/HR) 10 MCG/HR PTWK patch Place 10 mcg onto the skin once a   week.    Historical Provider, MD  cephALEXin (KEFLEX) 500 MG capsule Take 1 capsule (500 mg total) by mouth 2 (two) times daily. Patient not taking: Reported on 01/25/2015 11/25/14   Glendell Docker, NP  Cholecalciferol (VITAMIN D3) 1000 UNITS CAPS Take 2 tablets by mouth daily. 06/18/11   Historical Provider, MD  cloNIDine (CATAPRES) 0.1 MG tablet Take 1 tablet by mouth 2 (two) times daily. 1 TAB IN THE AM 2 TABS IN THE PM 10/31/12   Historical Provider, MD  econazole nitrate 1 % cream Apply 1 application topically daily.    Historical Provider, MD  fexofenadine (ALLEGRA) 180  MG tablet Take 180 mg by mouth daily.    Historical Provider, MD  fluticasone (FLONASE) 50 MCG/ACT nasal spray Place into both nostrils daily.    Historical Provider, MD  furosemide (LASIX) 20 MG tablet Take 1 tablet (20 mg total) by mouth 2 (two) times daily. 02/06/13   Kennyth Arnold, FNP  Garlic 6010 MG CAPS Take 5 tablets by mouth daily.    Historical Provider, MD  guaifenesin (HUMIBID E) 400 MG TABS tablet Take 400 mg by mouth. 03/22/13   Historical Provider, MD  Lactobacillus (PROBIOTIC ACIDOPHILUS) TABS Take 2 tablets by mouth daily. 01/08/13   Historical Provider, MD  levothyroxine (SYNTHROID, LEVOTHROID) 50 MCG tablet Take 50 mcg by mouth daily. 06/23/12   Historical Provider, MD  Melatonin 5 MG TABS Take by mouth.    Historical Provider, MD  metFORMIN (GLUCOPHAGE) 500 MG tablet Take 500 mg by mouth 2 (two) times daily with a meal.    Historical Provider, MD  mometasone-formoterol (DULERA) 200-5 MCG/ACT AERO Inhale 2 puffs into the lungs 2 (two) times daily.    Historical Provider, MD  montelukast (SINGULAIR) 10 MG tablet Take 1 tablet by mouth daily. 12/25/12   Historical Provider, MD  Multiple Vitamins-Minerals (THERA-M) TABS Take 1 tablet by mouth daily.    Historical Provider, MD  PARoxetine (PAXIL) 40 MG tablet TAKE 1 AND 1/2 TABLETS(60 MG) BY MOUTH DAILY 01/25/15   Charlcie Cradle, MD  potassium bicarbonate (K-LYTE) 25 MEQ disintegrating tablet Take 50 mEq by mouth 2 (two) times daily.    Historical Provider, MD  ranitidine (ZANTAC) 150 MG tablet Take 150 mg by mouth 2 (two) times daily.    Historical Provider, MD  valsartan (DIOVAN) 80 MG tablet Take 1 tablet by mouth daily. 12/29/12   Historical Provider, MD   BP 108/69 mmHg  Pulse 79  Temp(Src) 98.7 F (37.1 C) (Oral)  Resp 20  SpO2 99% Physical Exam  Constitutional: She is oriented to person, place, and time. She appears well-developed. No distress.  Obese, appears older than stated age.  HENT:  Head: Normocephalic and  atraumatic.  Right Ear: External ear normal.  Left Ear: External ear normal.  Eyes: Conjunctivae and EOM are normal. Pupils are equal, round, and reactive to light.  Neck: Normal range of motion and phonation normal. Neck supple.  Cardiovascular: Normal rate, regular rhythm and normal heart sounds.   Pulmonary/Chest: Effort normal and breath sounds normal. She exhibits no bony tenderness.  Abdominal: Soft. There is no tenderness.  Musculoskeletal: Normal range of motion.  Normal range of motion, arms, legs, bilaterally. Mild left lateral cervical tenderness, no step-off or tenderness of the cervical, thoracic or lumbar spine  Neurological: She is alert and oriented to person, place, and time. No cranial nerve deficit or sensory deficit. She exhibits normal muscle tone. Coordination normal.  No dysarthria and aphasia or  nystagmus.  Skin: Skin is warm, dry and intact.  Psychiatric: She has a normal mood and affect. Her behavior is normal. Judgment and thought content normal.  Nursing note and vitals reviewed.   ED Course  Procedures (including critical care time)  Medications - No data to display  Patient Vitals for the past 24 hrs:  BP Temp Temp src Pulse Resp SpO2  08/22/15 1823 108/69 mmHg 98.7 F (37.1 C) Oral 79 20 99 %    At D/C Reevaluation with update and discussion. After initial assessment and treatment, an updated evaluation reveals No change in clinical status. Jayvier Burgher L    '  Labs Review Labs Reviewed  BASIC METABOLIC PANEL - Abnormal; Notable for the following:    Potassium 3.2 (*)    Chloride 98 (*)    Glucose, Bld 101 (*)    Creatinine, Ser 1.64 (*)    GFR calc non Af Amer 35 (*)    GFR calc Af Amer 41 (*)    All other components within normal limits  CBC - Abnormal; Notable for the following:    WBC 11.5 (*)    Hemoglobin 11.6 (*)    HCT 35.5 (*)    All other components within normal limits  CBG MONITORING, ED    Component     Latest Ref Rng  11/24/2014 08/22/2015  Sodium     135 - 145 mmol/L 139 137  Potassium     3.5 - 5.1 mmol/L 3.4 (L) 3.2 (L)  Chloride     101 - 111 mmol/L 102 98 (L)  CO2     22 - 32 mmol/L 27 29  Glucose     65 - 99 mg/dL 105 (H) 101 (H)  BUN     6 - 20 mg/dL 11 13  Creatinine     0.44 - 1.00 mg/dL 1.20 (H) 1.64 (H)  Calcium     8.9 - 10.3 mg/dL 9.5 9.3  EGFR (Non-African Amer.)     >60 mL/min 51 (L) 35 (L)  EGFR (African American)     >60 mL/min 60 (L) 41 (L)  Anion gap     5 - _0 Imaging Review No results found. I have personally reviewed and evaluated these images and lab results as part of my medical decision-making.   EKG Interpretation None      MDM   Final diagnoses:  Episode of shaking  Renal insufficiency  Hypokalemia    Nonspecific shaking episode with ongoing dizzy spells, under evaluation by PCP, Cardiology and Neurology. No evident injury. No prior seizures. Labs with hypokalemia; recently on increased Lasix dose. Now on regular dose of Potassium. Mild renal insufficiency likely d/t recent diuresis.   Nursing Notes Reviewed/ Care Coordinated Applicable Imaging Reviewed Interpretation of Laboratory Data incorporated into ED treatment  The patient appears reasonably screened and/or stabilized for discharge and I doubt any other medical condition or other [facility] requiring further screening, evaluation, or treatment in the ED at this time prior to discharge.  Plan: Home Medications- Increase Potassium for 3 days; Home Treatments- Rest; return here if the recommended treatment, does not improve the symptoms; Recommended follow up- PCP for blood check in 3-4 days. Neurology for Seizure evaluation.   Daleen Bo, MD 08/23/15 1341

## 2015-08-22 NOTE — Discharge Instructions (Signed)
It is unclear if you have had a seizure today or not. You do appear mildly dehydrated. Increase your fluid intake by about 30 ounces per day for several days. Try to eat foods that contain a lot of potassium. Also double up on your potassium, for 3 days and have a level checked again this week.    Hypokalemia Hypokalemia means that the amount of potassium in the blood is lower than normal.Potassium is a chemical, called an electrolyte, that helps regulate the amount of fluid in the body. It also stimulates muscle contraction and helps nerves function properly.Most of the body's potassium is inside of cells, and only a very small amount is in the blood. Because the amount in the blood is so small, minor changes can be life-threatening. CAUSES  Antibiotics.  Diarrhea or vomiting.  Using laxatives too much, which can cause diarrhea.  Chronic kidney disease.  Water pills (diuretics).  Eating disorders (bulimia).  Low magnesium level.  Sweating a lot. SIGNS AND SYMPTOMS  Weakness.  Constipation.  Fatigue.  Muscle cramps.  Mental confusion.  Skipped heartbeats or irregular heartbeat (palpitations).  Tingling or numbness. DIAGNOSIS  Your health care provider can diagnose hypokalemia with blood tests. In addition to checking your potassium level, your health care provider may also check other lab tests. TREATMENT Hypokalemia can be treated with potassium supplements taken by mouth or adjustments in your current medicines. If your potassium level is very low, you may need to get potassium through a vein (IV) and be monitored in the hospital. A diet high in potassium is also helpful. Foods high in potassium are:  Nuts, such as peanuts and pistachios.  Seeds, such as sunflower seeds and pumpkin seeds.  Peas, lentils, and lima beans.  Whole grain and bran cereals and breads.  Fresh fruit and vegetables, such as apricots, avocado, bananas, cantaloupe, kiwi, oranges,  tomatoes, asparagus, and potatoes.  Orange and tomato juices.  Red meats.  Fruit yogurt. HOME CARE INSTRUCTIONS  Take all medicines as prescribed by your health care provider.  Maintain a healthy diet by including nutritious food, such as fruits, vegetables, nuts, whole grains, and lean meats.  If you are taking a laxative, be sure to follow the directions on the label. SEEK MEDICAL CARE IF:  Your weakness gets worse.  You feel your heart pounding or racing.  You are vomiting or having diarrhea.  You are diabetic and having trouble keeping your blood glucose in the normal range. SEEK IMMEDIATE MEDICAL CARE IF:  You have chest pain, shortness of breath, or dizziness.  You are vomiting or having diarrhea for more than 2 days.  You faint. MAKE SURE YOU:   Understand these instructions.  Will watch your condition.  Will get help right away if you are not doing well or get worse.   This information is not intended to replace advice given to you by your health care provider. Make sure you discuss any questions you have with your health care provider.   Document Released: 03/05/2005 Document Revised: 03/26/2014 Document Reviewed: 09/05/2012 Elsevier Interactive Patient Education 2016 [employer].  Potassium Content of Foods Potassium is a mineral found in many foods and drinks. It helps keep fluids and minerals balanced in your body and affects how steadily your heart beats. Potassium also helps control your blood pressure and keep your muscles and nervous system healthy. Certain health conditions and medicines may change the balance of potassium in your body. When this happens, you can help balance your  level of potassium through the foods that you do or do not eat. Your health care provider or dietitian may recommend an amount of potassium that you should have each day. The following lists of foods provide the amount of potassium (in parentheses) per serving in each  item. HIGH IN POTASSIUM  The following foods and beverages have 200 mg or more of potassium per serving:  Apricots, 2 raw or 5 dry (200 mg).  Artichoke, 1 medium (345 mg).  Avocado, raw,  each (245 mg).  Banana, 1 medium (425 mg).  Beans, lima, or baked beans, canned,  cup (280 mg).  Beans, white, canned,  cup (595 mg).  Beef roast, 3 oz (320 mg).  Beef, ground, 3 oz (270 mg).  Beets, raw or cooked,  cup (260 mg).  Bran muffin, 2 oz (300 mg).  Broccoli,  cup (230 mg).  Brussels sprouts,  cup (250 mg).  Cantaloupe,  cup (215 mg).  Cereal, 100% bran,  cup (200-400 mg).  Cheeseburger, single, fast food, 1 each (225-400 mg).  Chicken, 3 oz (220 mg).  Clams, canned, 3 oz (535 mg).  Crab, 3 oz (225 mg).  Dates, 5 each (270 mg).  Dried beans and peas,  cup (300-475 mg).  Figs, dried, 2 each (260 mg).  Fish: halibut, tuna, cod, snapper, 3 oz (480 mg).  Fish: salmon, haddock, swordfish, perch, 3 oz (300 mg).  Fish, tuna, canned 3 oz (200 mg).  [place] fries, fast food, 3 oz (470 mg).  Granola with fruit and nuts,  cup (200 mg).  Grapefruit juice,  cup (200 mg).  Greens, beet,  cup (655 mg).  Honeydew melon,  cup (200 mg).  Kale, raw, 1 cup (300 mg).  Kiwi, 1 medium (240 mg).  Kohlrabi, rutabaga, parsnips,  cup (280 mg).  Lentils,  cup (365 mg).  Mango, 1 each (325 mg).  Milk, chocolate, 1 cup (420 mg).  Milk: nonfat, low-fat, whole, buttermilk, 1 cup (350-380 mg).  Molasses, 1 Tbsp (295 mg).  Mushrooms,  cup (280) mg.  Nectarine, 1 each (275 mg).  Nuts: almonds, peanuts, hazelnuts, [place], cashew, mixed, 1 oz (200 mg).  Nuts, pistachios, 1 oz (295 mg).  Orange, 1 each (240 mg).  Orange juice,  cup (235 mg).  Papaya, medium,  fruit (390 mg).  Peanut butter, chunky, 2 Tbsp (240 mg).  Peanut butter, smooth, 2 Tbsp (210 mg).  Pear, 1 medium (200 mg).  Pomegranate, 1 whole (400 mg).  Pomegranate juice,  cup (215  mg).  Pork, 3 oz (350 mg).  Potato chips, salted, 1 oz (465 mg).  Potato, baked with skin, 1 medium (925 mg).  Potatoes, boiled,  cup (255 mg).  Potatoes, mashed,  cup (330 mg).  Prune juice,  cup (370 mg).  Prunes, 5 each (305 mg).  Pudding, chocolate,  cup (230 mg).  Pumpkin, canned,  cup (250 mg).  Raisins, seedless,  cup (270 mg).  Seeds, sunflower or pumpkin, 1 oz (240 mg).  Soy milk, 1 cup (300 mg).  Spinach,  cup (420 mg).  Spinach, canned,  cup (370 mg).  Sweet potato, baked with skin, 1 medium (450 mg).  Swiss chard,  cup (480 mg).  Tomato or vegetable juice,  cup (275 mg).  Tomato sauce or puree,  cup (400-550 mg).  Tomato, raw, 1 medium (290 mg).  Tomatoes, canned,  cup (200-300 mg).  [place], 3 oz (250 mg).  Wheat germ, 1 oz (250 mg).  Winter squash,  cup (250  mg).  Yogurt, plain or fruited, 6 oz (260-435 mg).  Zucchini,  cup (220 mg). MODERATE IN POTASSIUM The following foods and beverages have 50-200 mg of potassium per serving:  Apple, 1 each (150 mg).  Apple juice,  cup (150 mg).  Applesauce,  cup (90 mg).  Apricot nectar,  cup (140 mg).  Asparagus, small spears,  cup or 6 spears (155 mg).  Bagel, cinnamon raisin, 1 each (130 mg).  Bagel, egg or plain, 4 in., 1 each (70 mg).  Beans, green,  cup (90 mg).  Beans, yellow,  cup (190 mg).  Beer, regular, 12 oz (100 mg).  Beets, canned,  cup (125 mg).  Blackberries,  cup (115 mg).  Blueberries,  cup (60 mg).  Bread, whole wheat, 1 slice (70 mg).  Broccoli, raw,  cup (145 mg).  Cabbage,  cup (150 mg).  Carrots, cooked or raw,  cup (180 mg).  Cauliflower, raw,  cup (150 mg).  Celery, raw,  cup (155 mg).  Cereal, bran flakes, cup (120-150 mg).  Cheese, cottage,  cup (110 mg).  Cherries, 10 each (150 mg).  Chocolate, 1 oz bar (165 mg).  Coffee, brewed 6 oz (90 mg).  Corn,  cup or 1 ear (195 mg).  Cucumbers,  cup (80  mg).  Egg, large, 1 each (60 mg).  Eggplant,  cup (60 mg).  Endive, raw, cup (80 mg).  English muffin, 1 each (65 mg).  Fish, orange roughy, 3 oz (150 mg).  Frankfurter, beef or pork, 1 each (75 mg).  Fruit cocktail,  cup (115 mg).  Grape juice,  cup (170 mg).  Grapefruit,  fruit (175 mg).  Grapes,  cup (155 mg).  Greens: kale, turnip, collard,  cup (110-150 mg).  Ice cream or frozen yogurt, chocolate,  cup (175 mg).  Ice cream or frozen yogurt, vanilla,  cup (120-150 mg).  Lemons, limes, 1 each (80 mg).  Lettuce, all types, 1 cup (100 mg).  Mixed vegetables,  cup (150 mg).  Mushrooms, raw,  cup (110 mg).  Nuts: walnuts, pecans, or macadamia, 1 oz (125 mg).  Oatmeal,  cup (80 mg).  Okra,  cup (110 mg).  Onions, raw,  cup (120 mg).  Peach, 1 each (185 mg).  Peaches, canned,  cup (120 mg).  Pears, canned,  cup (120 mg).  Peas, green, frozen,  cup (90 mg).  Peppers, green,  cup (130 mg).  Peppers, red,  cup (160 mg).  Pineapple juice,  cup (165 mg).  Pineapple, fresh or canned,  cup (100 mg).  Plums, 1 each (105 mg).  Pudding, vanilla,  cup (150 mg).  Raspberries,  cup (90 mg).  Rhubarb,  cup (115 mg).  Rice, wild,  cup (80 mg).  Shrimp, 3 oz (155 mg).  Spinach, raw, 1 cup (170 mg).  Strawberries,  cup (125 mg).  Summer squash  cup (175-200 mg).  Swiss chard, raw, 1 cup (135 mg).  Tangerines, 1 each (140 mg).  Tea, brewed, 6 oz (65 mg).  Turnips,  cup (140 mg).  Watermelon,  cup (85 mg).  Wine, red, table, 5 oz (180 mg).  Wine, white, table, 5 oz (100 mg). LOW IN POTASSIUM The following foods and beverages have less than 50 mg of potassium per serving.  Bread, white, 1 slice (30 mg).  Carbonated beverages, 12 oz (less than 5 mg).  Cheese, 1 oz (20-30 mg).  Cranberries,  cup (45 mg).  Cranberry juice cocktail,  cup (20 mg).  Fats and  oils, 1 Tbsp (less than 5 mg).  Hummus, 1 Tbsp (32  mg).  Nectar: papaya, mango, or pear,  cup (35 mg).  Rice, white or brown,  cup (50 mg).  Spaghetti or macaroni,  cup cooked (30 mg).  Tortilla, flour or corn, 1 each (50 mg).  Waffle, 4 in., 1 each (50 mg).  Water chestnuts,  cup (40 mg).   This information is not intended to replace advice given to you by your health care provider. Make sure you discuss any questions you have with your health care provider.   Document Released: 10/17/2004 Document Revised: 03/10/2013 Document Reviewed: 01/30/2013 Elsevier Interactive Patient Education [employer].

## 2015-08-22 NOTE — ED Notes (Addendum)
She reports she collapsed onto the ground and felt her entire body convulsing and then lost consciousness this afternoon. A friend came and found her lying in the yard and woke her up. She c/o L neck and shoulder pain, which she states is from falling on the ground this afternoon, cmc intact. She reports a history of dizziness which she has seen multiple specialists for with no diagnosis. She denies past history of seizures. She is A&Ox4 now, breathing easily.

## 2015-10-16 DIAGNOSIS — I10 Essential (primary) hypertension: Secondary | ICD-10-CM | POA: Diagnosis not present

## 2015-10-16 DIAGNOSIS — Z7984 Long term (current) use of oral hypoglycemic drugs: Secondary | ICD-10-CM | POA: Insufficient documentation

## 2015-10-16 DIAGNOSIS — Z7982 Long term (current) use of aspirin: Secondary | ICD-10-CM | POA: Insufficient documentation

## 2015-10-16 DIAGNOSIS — R51 Headache: Secondary | ICD-10-CM | POA: Diagnosis not present

## 2015-10-16 DIAGNOSIS — E039 Hypothyroidism, unspecified: Secondary | ICD-10-CM | POA: Diagnosis not present

## 2015-10-16 DIAGNOSIS — J45909 Unspecified asthma, uncomplicated: Secondary | ICD-10-CM | POA: Diagnosis not present

## 2015-10-16 DIAGNOSIS — E119 Type 2 diabetes mellitus without complications: Secondary | ICD-10-CM | POA: Insufficient documentation

## 2015-10-17 ENCOUNTER — Emergency Department (HOSPITAL_COMMUNITY)
Admission: EM | Admit: 2015-10-17 | Discharge: 2015-10-17 | Disposition: A | Discharge status: Home / Self Care | Payer: Medicare HMO | Attending: Emergency Medicine | Admitting: Emergency Medicine

## 2015-10-17 ENCOUNTER — Encounter (HOSPITAL_COMMUNITY): Payer: Self-pay | Admitting: Emergency Medicine

## 2015-10-17 DIAGNOSIS — R51 Headache: Secondary | ICD-10-CM

## 2015-10-17 DIAGNOSIS — R519 Headache, unspecified: Secondary | ICD-10-CM

## 2015-10-17 LAB — CBG MONITORING, ED: GLUCOSE-CAPILLARY: 105 mg/dL — AB (ref 65–99)

## 2015-10-17 MED ORDER — SODIUM CHLORIDE 0.9 % IV BOLUS (SEPSIS)
1000.0000 mL | Freq: Once | INTRAVENOUS | Status: AC
  Administered 2015-10-17: 1000 mL via INTRAVENOUS

## 2015-10-17 MED ORDER — HYDROMORPHONE HCL 1 MG/ML IJ SOLN
0.5000 mg | Freq: Once | INTRAMUSCULAR | Status: AC
  Administered 2015-10-17: 0.5 mg via INTRAVENOUS
  Filled 2015-10-17: qty 1

## 2015-10-17 MED ORDER — DIPHENHYDRAMINE HCL 50 MG/ML IJ SOLN
25.0000 mg | Freq: Once | INTRAMUSCULAR | Status: AC
  Administered 2015-10-17: 25 mg via INTRAVENOUS
  Filled 2015-10-17: qty 1

## 2015-10-17 MED ORDER — HYDROCODONE-ACETAMINOPHEN 5-325 MG PO TABS: 1.0000 | ORAL_TABLET | ORAL | 0 refills | Status: DC | PRN

## 2015-10-17 MED ORDER — METOCLOPRAMIDE HCL 5 MG/ML IJ SOLN
10.0000 mg | Freq: Once | INTRAMUSCULAR | Status: AC
  Administered 2015-10-17: 10 mg via INTRAVENOUS
  Filled 2015-10-17: qty 2

## 2015-10-17 NOTE — ED Triage Notes (Signed)
Pt. reports left side migraine headache onset this week with mild nausea , denies emesis or photophobia.

## 2015-10-17 NOTE — ED Provider Notes (Signed)
TIME SEEN: 4:15 am  CHIEF COMPLAINT: Headache  HPI: Excedrin Migraine Left frontal lobe goes back to parietal, the pain is throbbing and has been persisting for 3 days, nausea for 24 hours. Also, some eye pain without change of vision. 9/10 pain waxing and waning and throbbing/stabbing.  She has history of migraines. This is different from previous because her typical headache is bifrontal. No systemic symptoms. She has chronic lower extremity weakness.  ROS: See HPI Constitutional: no fever  Eyes: no drainage  ENT: no runny nose   Cardiovascular:  no chest pain  Resp: no SOB  GI: no vomiting GU: no dysuria Integumentary: no rash  Allergy: no hives  Musculoskeletal: no leg swelling  Neurological: no slurred speech ROS otherwise negative  PAST MEDICAL HISTORY/PAST SURGICAL HISTORY:  Past Medical History:  Diagnosis Date  . Anemia   . Anxiety   . Asthma   . Chronic pain disorder   . Daytime somnolence 01/07/2013  . Depression   . Diabetes mellitus, type II ([place])   . Fibromyalgia   . GERD (gastroesophageal reflux disease)   . Hypertension   . Hypothyroidism   . MDD (major depressive disorder) ([place]) 01/07/2013  . Obesity   . OSA on CPAP 01/07/2013  . PTSD (post-traumatic stress disorder)   . Seasonal allergies   . Sickle cell trait ([place])   . Sleep apnea   . Vaginitis     MEDICATIONS:  Prior to Admission medications   Medication Sig Start Date End Date Taking? Authorizing Provider  acetaminophen (TYLENOL) 500 MG tablet 1,000 mg.    Historical Provider, MD  albuterol (PROVENTIL HFA;VENTOLIN HFA) 108 (90 BASE) MCG/ACT inhaler Inhale 2 puffs into the lungs 4 (four) times daily.    Historical Provider, MD  AMLODIPINE BESYLATE PO Take by mouth.    Historical Provider, MD  amoxicillin (AMOXIL) 875 MG tablet Take 875 mg by mouth 2 (two) times daily.    Historical Provider, MD  ARIPiprazole (ABILIFY) 15 MG tablet TAKE 1 TABLET(15 MG) BY MOUTH DAILY 01/25/15   Charlcie Cradle, MD   aspirin 81 MG tablet Take 81 mg by mouth daily.    Historical Provider, MD  b complex vitamins tablet Take 1 tablet by mouth daily.    Historical Provider, MD  baclofen (LIORESAL) 10 MG tablet Take 10 mg by mouth 3 (three) times daily.  02/04/13   Historical Provider, MD  buprenorphine (BUTRANS - DOSED MCG/HR) 10 MCG/HR PTWK patch Place 10 mcg onto the skin once a week.    Historical Provider, MD  cephALEXin (KEFLEX) 500 MG capsule Take 1 capsule (500 mg total) by mouth 2 (two) times daily. Patient not taking: Reported on 01/25/2015 11/25/14   Glendell Docker, NP  Cholecalciferol (VITAMIN D3) 1000 UNITS CAPS Take 2 tablets by mouth daily. 06/18/11   Historical Provider, MD  cloNIDine (CATAPRES) 0.1 MG tablet Take 1 tablet by mouth 2 (two) times daily. 1 TAB IN THE AM 2 TABS IN THE PM 10/31/12   Historical Provider, MD  econazole nitrate 1 % cream Apply 1 application topically daily.    Historical Provider, MD  fexofenadine (ALLEGRA) 180 MG tablet Take 180 mg by mouth daily.    Historical Provider, MD  fluticasone (FLONASE) 50 MCG/ACT nasal spray Place into both nostrils daily.    Historical Provider, MD  furosemide (LASIX) 20 MG tablet Take 1 tablet (20 mg total) by mouth 2 (two) times daily. 02/06/13   Kennyth Arnold, FNP  Garlic [id number] MG CAPS  Take 5 tablets by mouth daily.    Historical Provider, MD  guaifenesin (HUMIBID E) 400 MG TABS tablet Take 400 mg by mouth. 03/22/13   Historical Provider, MD  Lactobacillus (PROBIOTIC ACIDOPHILUS) TABS Take 2 tablets by mouth daily. 01/08/13   Historical Provider, MD  levothyroxine (SYNTHROID, LEVOTHROID) 50 MCG tablet Take 50 mcg by mouth daily. 06/23/12   Historical Provider, MD  Melatonin 5 MG TABS Take by mouth.    Historical Provider, MD  metFORMIN (GLUCOPHAGE) 500 MG tablet Take 500 mg by mouth 2 (two) times daily with a meal.    Historical Provider, MD  mometasone-formoterol (DULERA) 200-5 MCG/ACT AERO Inhale 2 puffs into the lungs 2 (two) times daily.     Historical Provider, MD  montelukast (SINGULAIR) 10 MG tablet Take 1 tablet by mouth daily. 12/25/12   Historical Provider, MD  Multiple Vitamins-Minerals (THERA-M) TABS Take 1 tablet by mouth daily.    Historical Provider, MD  PARoxetine (PAXIL) 40 MG tablet TAKE 1 AND 1/2 TABLETS(60 MG) BY MOUTH DAILY 01/25/15   Charlcie Cradle, MD  potassium bicarbonate (K-LYTE) 25 MEQ disintegrating tablet Take 50 mEq by mouth 2 (two) times daily.    Historical Provider, MD  ranitidine (ZANTAC) 150 MG tablet Take 150 mg by mouth 2 (two) times daily.    Historical Provider, MD  valsartan (DIOVAN) 80 MG tablet Take 1 tablet by mouth daily. 12/29/12   Historical Provider, MD    ALLERGIES:  Allergies  Allergen Reactions  . Benztropine Nausea And Vomiting  . Cyclobenzaprine     Hallucinations  . Lactose Intolerance (Gi) Diarrhea  . Lamotrigine     'fall', balance problems  . Latex     Redness,swelling,itching, localized pain  . Lisinopril     cough  . Topamax [Topiramate] Other (See Comments)    Burning in hands and feet  . Tramadol Nausea And Vomiting  . Phenylalanine Rash    SOCIAL HISTORY:  Social History  Substance Use Topics  . Smoking status: Never Smoker  . Smokeless tobacco: Never Used  . Alcohol use No    FAMILY HISTORY: Family History  Problem Relation Age of Onset  . Bipolar disorder Sister   . Suicidality Neg Hx     EXAM: BP 134/89 (BP Location: Left Arm)   Pulse 78   Temp 98.2 F (36.8 C) (Oral)   Resp 16   Ht 5\' 5"  (1.651 m)   Wt 124.7 kg   SpO2 99%   BMI 45.76 kg/m  CONSTITUTIONAL: Alert and oriented and responds appropriately to questions. Well-appearing; well-nourished HEAD: Normocephalic EYES: Conjunctivae clear, PERRL ENT: normal nose; no rhinorrhea; moist mucous membranes NECK: Supple, no meningismus, no LAD  CARD: RRR; S1 and S2 appreciated; no murmurs, no clicks, no rubs, no gallops RESP: Normal chest excursion without splinting or tachypnea; breath sounds  clear and equal bilaterally; no wheezes, no rhonchi, no rales, no hypoxia or respiratory distress, speaking full sentences ABD/GI: Normal bowel sounds; non-distended; soft, non-tender, no rebound, no guarding, no peritoneal signs BACK:  The back appears normal and is non-tender to palpation, there is no CVA tenderness EXT: Normal ROM in all joints; non-tender to palpation; no edema; normal capillary refill; no cyanosis, no calf tenderness or swelling    SKIN: Normal color for age and race; warm; no rash NEURO: Cranial nerves grossly intact on exam. Pt alert and oriented x 3 Upper and lower extremity strength is symmetrical and physiologic Normal muscular tone No facial droop Coordination intact, no  limb ataxia,No pronator drift PSYCH: The patient's mood and manner are appropriate. Grooming and personal hygiene are appropriate.  MEDICAL DECISION MAKING: Presentation is non concerning for [facility], ICH, Meningitis, or temporal arteritis. Pt is afebrile with no focal neuro deficits, nuchal rigidity, or change in vision. The patient denies any symptoms of neurological impairment or TIA's; no amaurosis, diplopia, dysphasia, or unilateral disturbance of motor or sensory function. No loss of balance or vertigo.   ED PROGRESS: Patients pain improved to 2/10 with interventions in the ED and is feeling much better and requests discharge,. She says that she has a ride home. She has a neurologist Dr. Ronnald Ramp and plans to call to be seen today as soon as the office opens.      Delos Haring, PA-C [id number]    Delora Fuel, MD [id number] [id number]

## 2015-11-30 ENCOUNTER — Ambulatory Visit (INDEPENDENT_AMBULATORY_CARE_PROVIDER_SITE_OTHER): Payer: Medicare HMO | Admitting: Neurology

## 2015-11-30 ENCOUNTER — Encounter: Payer: Self-pay | Admitting: Neurology

## 2015-11-30 VITALS — BP 130/78 | HR 86 | Resp 18 | Ht 66.0 in | Wt 286.0 lb

## 2015-11-30 DIAGNOSIS — Z9989 Dependence on other enabling machines and devices: Principal | ICD-10-CM

## 2015-11-30 DIAGNOSIS — G4733 Obstructive sleep apnea (adult) (pediatric): Secondary | ICD-10-CM | POA: Diagnosis not present

## 2015-11-30 NOTE — Patient Instructions (Signed)
Please continue using your CPAP regularly. While your insurance requires that you use CPAP at least 4 hours each night on 70% of the nights, I recommend, that you not skip any nights and use it throughout the night if you can. Getting used to CPAP and staying with the treatment long term does take time and patience and discipline. Untreated obstructive sleep apnea when it is moderate to severe can have an adverse impact on cardiovascular health and raise her risk for heart disease, arrhythmias, hypertension, congestive heart failure, stroke and diabetes. Untreated obstructive sleep apnea causes sleep disruption, nonrestorative sleep, and sleep deprivation. This can have an impact on your day to day functioning and cause daytime sleepiness and impairment of cognitive function, memory loss, mood disturbance, and problems focussing. Using CPAP regularly can improve these symptoms.  I will increase her pressure to 16 cm on a CPAP machine. You have gained weight since the last time I saw you. Please for weight loss which will help reduce your sleep apnea severity and allow [place] to scale back on the pressure requirement.  Keep up the good work! I will see you back in 12 months for sleep apnea check up.

## 2015-11-30 NOTE — Progress Notes (Signed)
Subjective:    Patient ID: Abigail Mendez is a 50 y.o. female.  HPI     Interim history:   Abigail Mendez is a 50 year old right-handed woman with an underlying complex medical history of hypertension, obesity, FMS with chronic pain, MDD, PTSD, asthma, sickle cell trait, chronic fatigue, IBS, OA, GERD, CTS, and allergies, who presents for followup consultation of her OSA. She is unaccompanied today and presents for her one-year checkup. I last saw on 11/30/2014, at which time she was doing okay, was compliant with treatment, she had presented to the emergency room on 11/24/2014 with palpitations and subjective concern for A. fib. Her EKG was in keeping with sinus rhythm. I reviewed the emergency room records. She was diagnosed with a urinary tract infection and was advised to monitor his symptoms and follow-up with cardiology for potential Holter monitor. She sees a cardiologist in [facility]. She is on generic Paxil 60 mg daily and no longer on vistaril. She sees multiple specialists. She sees pain management.  Today, 11/30/2015: I reviewed her CPAP compliance data from 10/30/2015 through 11/28/2015 which is a total of 30 days, during which time she used her machine 29 days with percent used days greater than 4 hours at 80%, indicating very good compliance with an average usage of 6 hours and 32 minutes, residual AHI borderline at 5.4 per hour, leak low for the 95th percentile at 5.4 L/m on a pressure of 15 cm with EPR of 2.  Today, 11/30/2015: She reports that she feels that the CPAP pressure needs to be adjusted and she's not getting as much sleep as she should. Of note, she presented to the emergency room on 10/17/2015 with headache, she was treated symptomatically and was advised to follow-up with Dr. Ronnald Ramp. Of note, she also presented to the emergency room on 08/22/2015 for shaking spell. She was found to have low potassium. She was advised to follow-up with her primary care physician for  potassium recheck in a few days. Of note, she has gained weight since I last saw her, in the realm of 17 pounds. She has lightheadedness and dizziness, has had 3 syncopal spells in 3 years, due for loop recorder placement tomorrow.   Previously:   I saw her on 11/30/2013, at which time she reported trying Vistaril 25 mg to 50 mg per psychiatrist. She had been tried on Ambien which did not help and she tried melatonin which also did not help. She reported a fall in May 2000 2015 and this resulted in a black eye. She reported that she went to [facility] and had a head CT which per her report was negative. She was no longer on Effexor was on a higher dose of Abilify and had been started on lithium. She reported full compliance with CPAP therapy indicating that it was helping her sleep and her daytime somnolence as well as sleep quality. She still had nocturia about 4 times each night and reported weight gain.   I reviewed her CPAP compliance data from 10/29/2014 through 11/27/2014 which is a total of 30 days during which time she used her machine 30 days with percent used days greater than 4 hours at 80%, indicating very good compliance with an average usage of 5 hours and 49 minutes, residual AHI high at 37.2 per hour, leak I with the 95th percentile at 30.4 L/m on a pressure of 12 cm with EPR of 2. Her residual AHI seems to be high secondary to obstructive apneic  events.    I saw her on 05/27/2013, at which time she was compliant with CPAP therapy but reported residual daytime symptoms including non-restorative sleep, sleep disruption and problems achieving and maintaining sleep. This had been a chronic problem. I talked to her about maintaining good sleep hygiene. I advised her against trying additional psychotropic or prescription sleep aids but suggested that she could try melatonin.    I reviewed her compliance data from 09/01/2013 through 11/29/2013 which is the last 90 days during  which time she used her machine every night. Percent used days greater than 4 hours was 89%, indicating very good compliance. Average usage was 6 hours and 25 minutes. Residual AHI was low at 0.9 per hour, leak was acceptable at 15.5 L per minute at the 95th percentile. She shares a letter with me that she got from her insurance provider. She says that she may not be able to keep her CPAP machine as her insurance is no longer covering it. She says that she never had a face-to-face after her original sleep study or before her original sleep study which was in January of last year. I first met her in October of last year after she needed to switch sleep physicians after an insurance change, as understand. I have reviewed her sleep studies from January and February of last year and she has always been compliant with treatment.    I first met her on 01/07/2013 at the request of her primary care physician. She was diagnosed with OSA in 1/14. She reported a change in her insurance coverage and need to change her DME company as well as her sleep specialist. I reviewed her sleep study from 04/17/2012 which showed a sleep efficiency of 70.9%. Sleep latency was 32 minutes and REM latency was increased at 306.5 minutes. She had a decrease in rem sleep. She had a total AHI of 20 per hour. Her baseline oxygen saturation was 98% while awake. Oxygen nadir was 77%. Time below 88% saturation was 4.6% of the study. She had a CPAP titration study on 04/22/2012 and I reviewed the test results: She was noted to have a sleep efficiency of 88.4% with a latency to sleep of 25 minutes and REM latency of 77.6 minutes. She had an increased percentage of REM sleep. Baseline oxygen saturation while awake was 98%, lowest asleep oxygen saturation was 80%. Time below 88% saturation was 4.6% of the study. Her overall AHI was reduced at 3.2 per hour. She had a mild increase in periodic leg movements at 7.6 per hour, with an associated arousal  index of 0 per hour. She was titrated on CPAP from 5-12 cm.   She was placed on CPAP in 2/14 and I reviewed the patient's CPAP compliance data from 10/09/12 to 01/06/13 (90 days), during which time the patient used CPAP every day except for 2 days. The average usage for all days was 8 hours and 6 minutes. The percent used days greater than 4 hours was 93%, indicating excellent compliance. The residual AHI was 0.4 per hour, indicating an appropriate treatment pressure of 12 cwp with EPR of 3.   At the time of her first appointment with me she indicated full compliance with CPAP but still had residual issues with nonrestorative sleep and daytime tiredness. Her ESS was 16/24. She has tried improving her sleep hygiene. She had gained wt on Lyrica and stopped it. She also stopped her Wellbutrin, Zoloft, and Abilify on her own. She has been taking hydrocodone  and robaxin for FMS. She has not yet seen a pain specialist, but is in the process of getting a referral. She sees Dr. Estanislado Pandy in rheumatology. She used to go to [facility], but stopped going there on 12/25/12. I suggested that she continue using CPAP regularly. I also talked to her about potentially retesting her.   I reviewed her compliance data from 04/21/2013 through 05/26/2013 which is a total of 36 days during which time she is CPAP every night except for one night. Percent used days greater than 4 hours was 86%, indicating very good compliance with an average usage of 6 hours and 28 minutes. Residual AHI is 1.7 per hour with very low leak. Pressure is 12 cm with EPR of 2.   She had tried Ambien, which did not help her stay asleep, she tried Melatonin 4.5 mg, which did not help. She is on Clonidine for BP, anxiety and nightmares. She is on Baclofen in lieu of Robaxin and is on Effexor XR 150 mg, which started in November 2014. She was re-started on Abilify 10 mg qHS. She has started seeing a pain Physician, Dr. Wess Botts, in [place]. She had  been on Seroquel in the past, but gained a lot of weight.   His Past Medical History Is Significant For: Past Medical History:  Diagnosis Date  . Anemia   . Anxiety   . Asthma   . Chronic pain disorder   . Daytime somnolence 01/07/2013  . Depression   . Diabetes mellitus, type II ([place])   . Fibromyalgia   . GERD (gastroesophageal reflux disease)   . Hypertension   . Hypothyroidism   . MDD (major depressive disorder) ([place]) 01/07/2013  . Obesity   . OSA on CPAP 01/07/2013  . PTSD (post-traumatic stress disorder)   . Seasonal allergies   . Sickle cell trait ([place])   . Sleep apnea   . Vaginitis     His Past Surgical History Is Significant For: Past Surgical History:  Procedure Laterality Date  . NO PAST SURGERIES      His Family History Is Significant For: Family History  Problem Relation Age of Onset  . Bipolar disorder Sister   . Suicidality Neg Hx     His Social History Is Significant For: Social History   Social History  . Marital status: Single    Spouse name: N/A  . Number of children: N/A  . Years of education: N/A   Social History Main Topics  . Smoking status: Never Smoker  . Smokeless tobacco: Never Used  . Alcohol use No  . Drug use: No  . Sexual activity: No   Other Topics Concern  . None   Social History Narrative  . None    His Allergies Are:  Allergies  Allergen Reactions  . Benztropine Nausea And Vomiting  . Cyclobenzaprine     Hallucinations  . Lactose Intolerance (Gi) Diarrhea  . Lamotrigine     'fall', balance problems  . Latex     Redness,swelling,itching, localized pain  . Lisinopril     cough  . Topamax [Topiramate] Other (See Comments)    Burning in hands and feet  . Tramadol Nausea And Vomiting  . Phenylalanine Rash  :   His Current Medications Are:  Outpatient Encounter Prescriptions as of 11/30/2015  Medication Sig  . acetaminophen (TYLENOL) 500 MG tablet Take 1,000 mg by mouth every 6 (six) hours as needed for  headache.   . albuterol (PROVENTIL HFA;VENTOLIN HFA)  108 (90 BASE) MCG/ACT inhaler Inhale 2 puffs into the lungs 4 (four) times daily.  . ARIPiprazole (ABILIFY) 15 MG tablet TAKE 1 TABLET(15 MG) BY MOUTH DAILY  . aspirin 81 MG tablet Take 81 mg by mouth daily.  Marland Kitchen b complex vitamins tablet Take 1 tablet by mouth daily.  . baclofen (LIORESAL) 10 MG tablet Take 10 mg by mouth 3 (three) times daily.   . buprenorphine (BUTRANS - DOSED MCG/HR) 10 MCG/HR PTWK patch Place 10 mcg onto the skin once a week. On Monday  . cephALEXin (KEFLEX) 500 MG capsule Take 1 capsule (500 mg total) by mouth 2 (two) times daily.  . Cholecalciferol (VITAMIN D3) 1000 UNITS CAPS Take 2 tablets by mouth daily.  . cloNIDine (CATAPRES) 0.1 MG tablet Take 0.1-0.2 tablets by mouth 2 (two) times daily. 1 TAB IN THE AM 2 TABS IN THE PM  . furosemide (LASIX) 20 MG tablet Take 1 tablet (20 mg total) by mouth 2 (two) times daily.  . Garlic 0100 MG CAPS Take 5 tablets by mouth daily.  Marland Kitchen HYDROcodone-acetaminophen (NORCO/VICODIN) 5-325 MG tablet Take 1-2 tablets by mouth every 4 (four) hours as needed.  . Lactobacillus (PROBIOTIC ACIDOPHILUS) TABS Take 2 tablets by mouth daily.  Marland Kitchen levothyroxine (SYNTHROID, LEVOTHROID) 50 MCG tablet Take 50 mcg by mouth daily.  . Melatonin 5 MG TABS Take 5 mg by mouth at bedtime.   . metFORMIN (GLUCOPHAGE) 500 MG tablet Take 500 mg by mouth daily with breakfast.   . methocarbamol (ROBAXIN) 500 MG tablet Take by mouth.  . mometasone-formoterol (DULERA) 200-5 MCG/ACT AERO Inhale 2 puffs into the lungs 2 (two) times daily.  . montelukast (SINGULAIR) 10 MG tablet Take 10 mg by mouth daily.   . Multiple Vitamin (MULTIVITAMIN WITH MINERALS) TABS tablet Take 1 tablet by mouth daily.  Marland Kitchen PARoxetine (PAXIL) 40 MG tablet TAKE 1 AND 1/2 TABLETS(60 MG) BY MOUTH DAILY  . potassium bicarbonate (K-LYTE) 25 MEQ disintegrating tablet Take 50 mEq by mouth 2 (two) times daily.  . ranitidine (ZANTAC) 150 MG tablet Take 150  mg by mouth 2 (two) times daily.  . valsartan (DIOVAN) 160 MG tablet   . [DISCONTINUED] valsartan (DIOVAN) 80 MG tablet Take 80 mg by mouth daily.    No facility-administered encounter medications on file as of 11/30/2015.   :  Review of Systems:  Out of a complete 14 point review of systems, all are reviewed and negative with the exception of these symptoms as listed below: Review of Systems  Neurological:       Patient feels that her CPAP pressure needs to be adjusted. She says that she is not getting as much sleep as she should.     Objective:  Neurologic Exam  Physical Exam Physical Examination:   Vitals:   11/30/15 0931  BP: 130/78  Pulse: 86  Resp: 18   General Examination: The patient is a very pleasant 50 y.o. female in no acute distress. She appears well-developed and well-nourished and well groomed. She is obese and has gained some weight.   HEENT: Normocephalic, atraumatic, pupils are equal, round and reactive to light and accommodation. Extraocular tracking is good without limitation to gaze excursion or nystagmus noted. Normal smooth pursuit is noted. Hearing is grossly intact. Face is symmetric with normal facial animation and normal facial sensation. Speech is clear with no dysarthria noted. There is no hypophonia. There is no lip, neck/head, jaw or voice tremor. Neck is supple with full range of passive and  active motion. There are no carotid bruits on auscultation. Oropharynx exam reveals: mild mouth dryness, adequate dental hygiene and moderate airway crowding, due to narrow airway, tonsils of 2+ and larger tongue. Mild pharyngeal erythema is noted. Mallampati is class III. Tongue protrudes centrally and palate elevates symmetrically.   Chest: Clear to auscultation without wheezing, rhonchi or crackles noted.  Heart: S1+S2+0, regular and normal without murmurs, rubs or gallops noted.   Abdomen: Soft, non-tender and non-distended with normal bowel sounds appreciated  on auscultation.  Extremities: There is trace pitting edema in the distal lower extremities bilaterally. Pedal pulses are intact.  Skin: Warm and dry without trophic changes noted. There are no varicose veins.  Musculoskeletal: exam reveals no obvious joint deformities, tenderness or joint swelling or erythema.   Neurologically:  Mental status: The patient is awake, alert and oriented in all 4 spheres. Her memory, attention, language and knowledge are appropriate. There is no aphasia, agnosia, apraxia or anomia. Speech is clear with normal prosody and enunciation. Thought process is linear. Mood is depressed and affect is blunted.  Cranial nerves are as described above under HEENT exam. In addition, shoulder shrug is normal with equal shoulder height noted. Motor exam: Normal bulk, strength and tone is noted. There is no drift, tremor or rebound. Romberg is negative. Reflexes are 1+ throughout. Toes are downgoing bilaterally. Fine motor skills are intact with the exception of mild slowness in the upper and lower extremities. Sensory exam is intact to light touch throughout. She stands slowly. She is not able to do tandem walk. She can walk without the cane some, typically walks with a cane, she turns slowly.  Assessment and Plan:   In summary, Abigail Mendez is a very pleasant 50 year old female with a complex underlying medical history and a diagnosis of moderate obstructive sleep apnea in 1/14, for which she is on CPAP with full compliance and good results. She has been compliant since I met her in October 2014. We increased her pressure from 12 cm to 15 cm. Unfortunately, she had some interim weight gain and also has some residual symptoms of snoring and not sleeping as well as she is used to. We will increase her pressure at this time from 15 cm to 16 cm as her AHI is borderline at 5.4. Physical exam is stable. She sees multiple specialists for her various problems. She is scheduled for a loop  monitor placement tomorrow. She has undergone Holter monitoring for her syncopal spells. She is commended for her CPAP treatment adherence. She is encouraged to work on weight loss. She says that she is back in the gym. I suggested a one-year checkup, sooner as needed. She is up-to-date with her supplies but I renewed her supply order for CPAP mask and related supplies as well as placed an order for her pressure increase. I answered all her questions today and she was in agreement with the plan.  I spent 25 minutes in total face-to-face time with the patient, more than 50% of which was spent in counseling and coordination of care, reviewing test results, reviewing medication and discussing or reviewing the diagnosis of OSA, its prognosis and treatment options.

## 2015-12-01 DIAGNOSIS — R55 Syncope and collapse: Secondary | ICD-10-CM | POA: Insufficient documentation

## 2015-12-06 ENCOUNTER — Encounter: Payer: Self-pay | Admitting: *Deleted

## 2015-12-27 DIAGNOSIS — R7989 Other specified abnormal findings of blood chemistry: Secondary | ICD-10-CM | POA: Insufficient documentation

## 2016-01-04 DIAGNOSIS — R7989 Other specified abnormal findings of blood chemistry: Secondary | ICD-10-CM | POA: Insufficient documentation

## 2016-04-23 DIAGNOSIS — Z95818 Presence of other cardiac implants and grafts: Secondary | ICD-10-CM | POA: Insufficient documentation

## 2016-06-27 ENCOUNTER — Encounter: Payer: Self-pay | Admitting: Neurology

## 2016-07-26 ENCOUNTER — Other Ambulatory Visit: Payer: Self-pay | Admitting: Physician Assistant

## 2016-07-26 DIAGNOSIS — Z1231 Encounter for screening mammogram for malignant neoplasm of breast: Secondary | ICD-10-CM

## 2016-08-14 ENCOUNTER — Ambulatory Visit
Admission: RE | Admit: 2016-08-14 | Discharge: 2016-08-14 | Disposition: A | Discharge status: Home / Self Care | Payer: Medicare HMO | Source: Ambulatory Visit | Attending: Physician Assistant | Admitting: Physician Assistant

## 2016-08-14 DIAGNOSIS — Z1231 Encounter for screening mammogram for malignant neoplasm of breast: Secondary | ICD-10-CM

## 2016-08-21 DIAGNOSIS — R42 Dizziness and giddiness: Secondary | ICD-10-CM | POA: Insufficient documentation

## 2016-09-06 DIAGNOSIS — Z79899 Other long term (current) drug therapy: Secondary | ICD-10-CM | POA: Insufficient documentation

## 2016-11-07 IMAGING — DX DG CHEST 2V
2 series · 2 of 2 positions shown · non-contrast
Comparison: None.

CLINICAL DATA: Dizziness contusion shortness of breath weakness for
8 weeks

EXAM:
CHEST  2 VIEW

[chest pa]
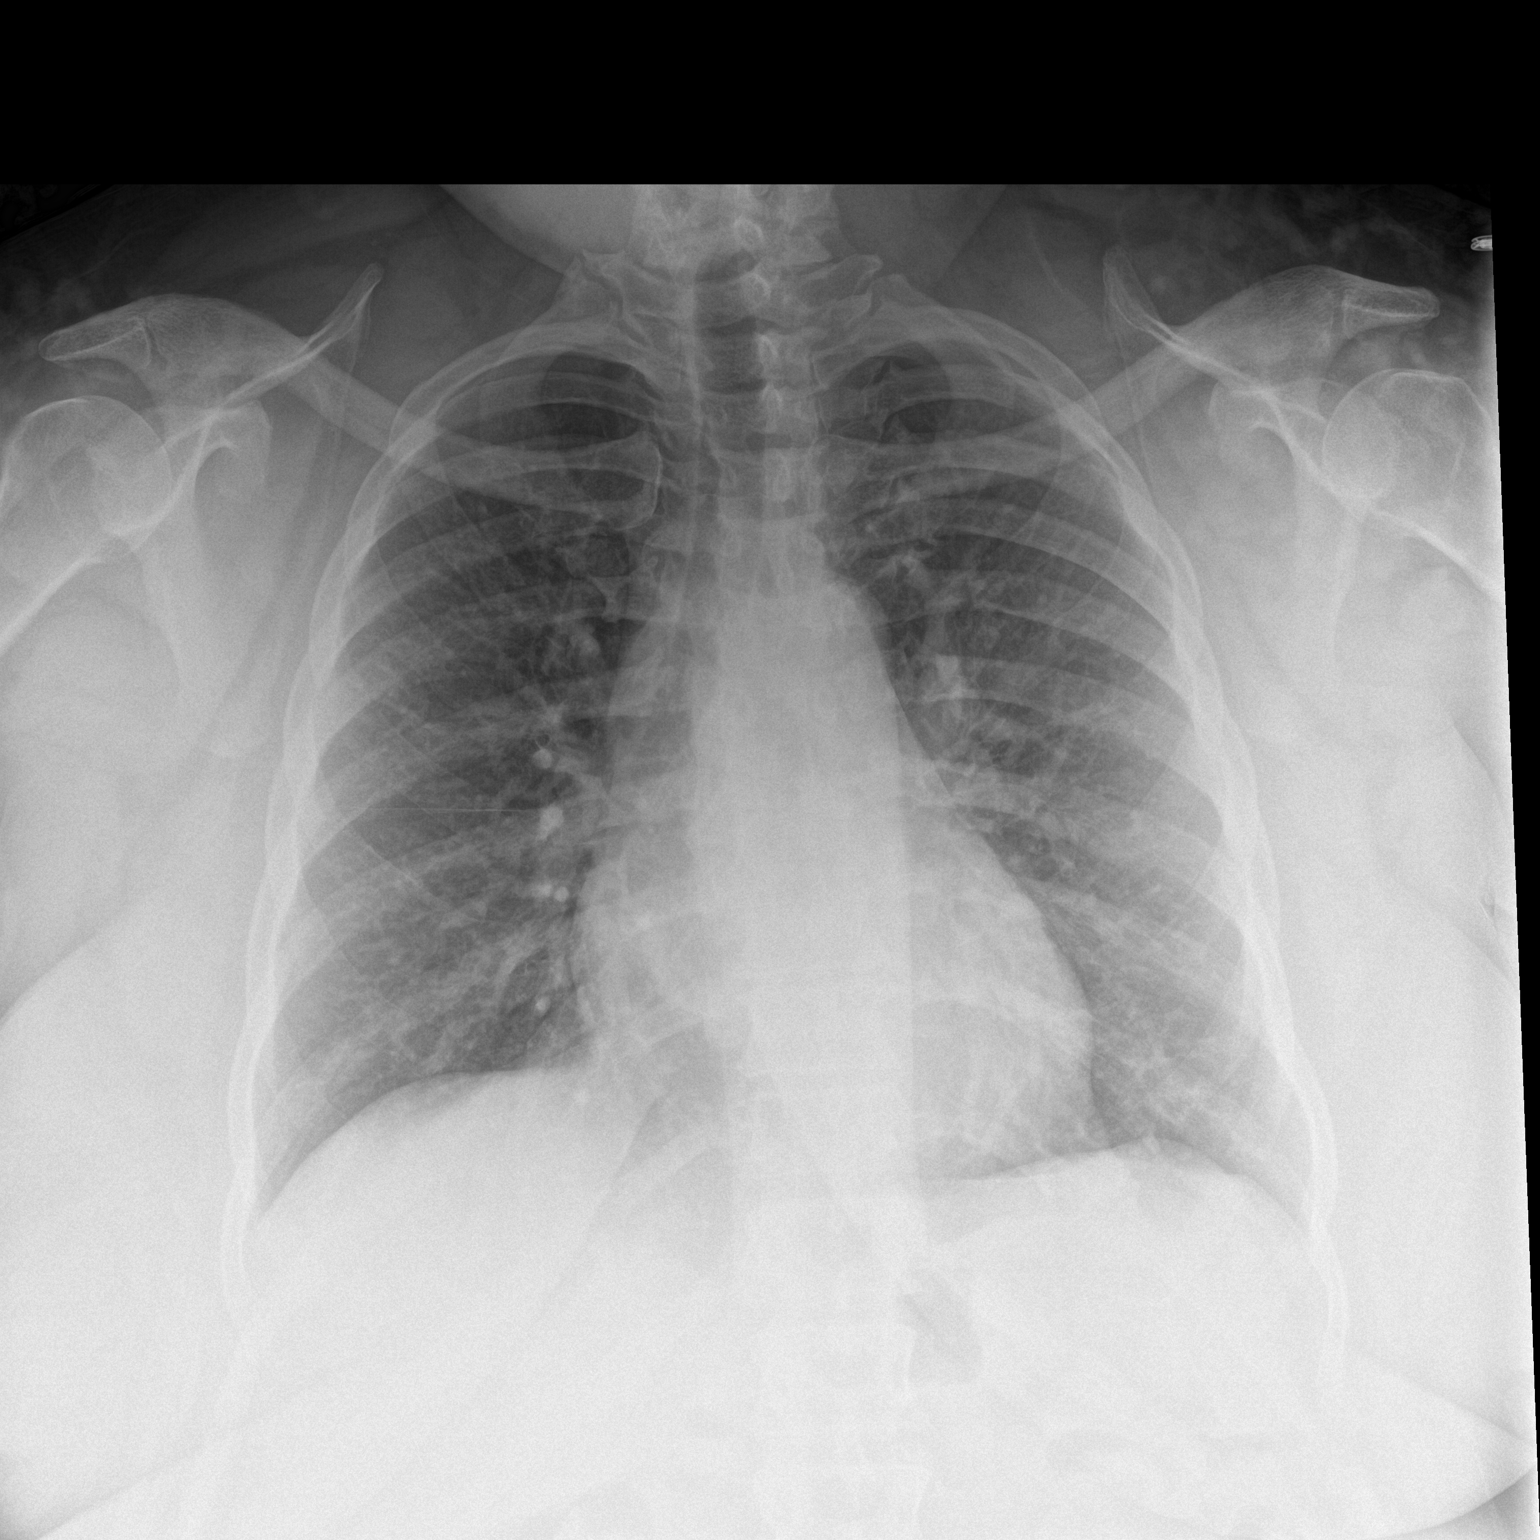

[chest lat]
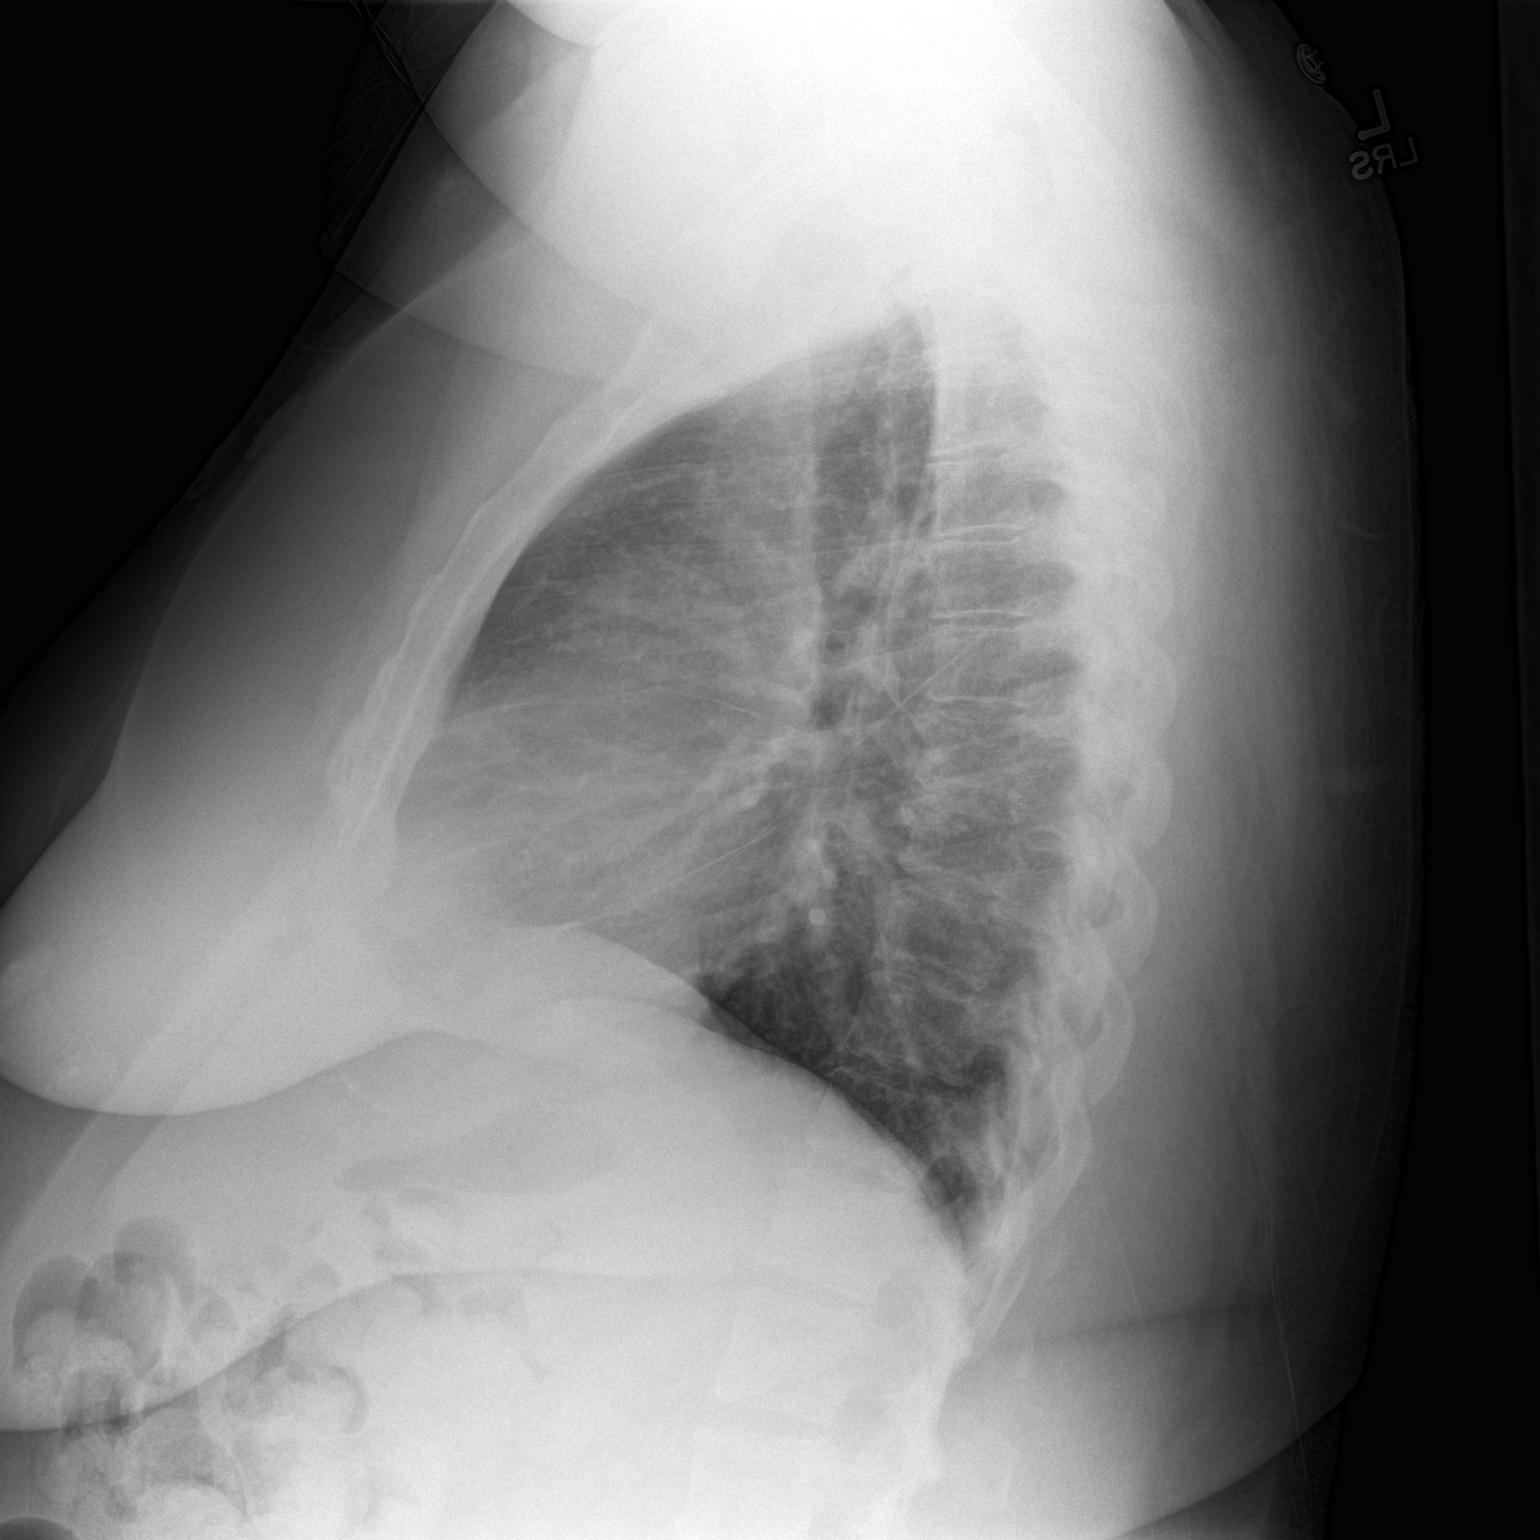

[2 of 2 positions shown; findings below may reference images not displayed]

FINDINGS: The heart size and mediastinal contours are within normal limits.
Both lungs are clear. The visualized skeletal structures are
unremarkable.
IMPRESSION: No active cardiopulmonary disease.

## 2016-11-28 ENCOUNTER — Ambulatory Visit: Payer: Medicare HMO | Admitting: Neurology

## 2017-04-14 DIAGNOSIS — G629 Polyneuropathy, unspecified: Secondary | ICD-10-CM | POA: Insufficient documentation

## 2017-07-03 ENCOUNTER — Other Ambulatory Visit: Payer: Self-pay | Admitting: Physician Assistant

## 2017-07-03 DIAGNOSIS — Z1231 Encounter for screening mammogram for malignant neoplasm of breast: Secondary | ICD-10-CM

## 2017-08-15 ENCOUNTER — Ambulatory Visit
Admission: RE | Admit: 2017-08-15 | Discharge: 2017-08-15 | Disposition: A | Discharge status: Home / Self Care | Payer: Medicare HMO | Source: Ambulatory Visit | Attending: Physician Assistant | Admitting: Physician Assistant

## 2017-08-15 DIAGNOSIS — Z1231 Encounter for screening mammogram for malignant neoplasm of breast: Secondary | ICD-10-CM

## 2018-09-02 DIAGNOSIS — Z124 Encounter for screening for malignant neoplasm of cervix: Secondary | ICD-10-CM | POA: Insufficient documentation

## 2018-09-08 ENCOUNTER — Other Ambulatory Visit: Payer: Self-pay | Admitting: Physician Assistant

## 2018-09-08 DIAGNOSIS — Z1231 Encounter for screening mammogram for malignant neoplasm of breast: Secondary | ICD-10-CM

## 2018-09-09 ENCOUNTER — Ambulatory Visit
Admission: RE | Admit: 2018-09-09 | Discharge: 2018-09-09 | Disposition: A | Discharge status: Home / Self Care | Payer: Medicare HMO | Source: Ambulatory Visit | Attending: Physician Assistant | Admitting: Physician Assistant

## 2018-09-09 DIAGNOSIS — Z1231 Encounter for screening mammogram for malignant neoplasm of breast: Secondary | ICD-10-CM

## 2018-12-12 DIAGNOSIS — M19012 Primary osteoarthritis, left shoulder: Secondary | ICD-10-CM | POA: Insufficient documentation

## 2019-04-07 DIAGNOSIS — K760 Fatty (change of) liver, not elsewhere classified: Secondary | ICD-10-CM | POA: Insufficient documentation

## 2019-08-06 DIAGNOSIS — E1122 Type 2 diabetes mellitus with diabetic chronic kidney disease: Secondary | ICD-10-CM | POA: Insufficient documentation

## 2019-08-07 ENCOUNTER — Other Ambulatory Visit: Payer: Self-pay | Admitting: Physician Assistant

## 2019-08-07 DIAGNOSIS — Z1231 Encounter for screening mammogram for malignant neoplasm of breast: Secondary | ICD-10-CM

## 2019-09-10 ENCOUNTER — Other Ambulatory Visit: Payer: Self-pay

## 2019-09-10 ENCOUNTER — Ambulatory Visit
Admission: RE | Admit: 2019-09-10 | Discharge: 2019-09-10 | Disposition: A | Discharge status: Home / Self Care | Payer: Medicare HMO | Source: Ambulatory Visit | Attending: Physician Assistant | Admitting: Physician Assistant

## 2019-09-10 DIAGNOSIS — Z1231 Encounter for screening mammogram for malignant neoplasm of breast: Secondary | ICD-10-CM

## 2020-01-07 DIAGNOSIS — R002 Palpitations: Secondary | ICD-10-CM | POA: Insufficient documentation

## 2020-01-07 DIAGNOSIS — R0609 Other forms of dyspnea: Secondary | ICD-10-CM | POA: Insufficient documentation

## 2020-03-17 DIAGNOSIS — D631 Anemia in chronic kidney disease: Secondary | ICD-10-CM | POA: Insufficient documentation

## 2020-03-17 DIAGNOSIS — N1831 Chronic kidney disease, stage 3a: Secondary | ICD-10-CM | POA: Insufficient documentation

## 2020-03-17 DIAGNOSIS — D72820 Lymphocytosis (symptomatic): Secondary | ICD-10-CM | POA: Insufficient documentation

## 2020-03-21 ENCOUNTER — Ambulatory Visit: Payer: Medicare HMO | Admitting: Neurology

## 2020-04-28 ENCOUNTER — Ambulatory Visit: Payer: Medicare HMO | Admitting: Neurology

## 2020-04-28 ENCOUNTER — Encounter: Payer: Self-pay | Admitting: Neurology

## 2020-04-28 VITALS — BP 128/84 | HR 87 | Ht 65.0 in | Wt 263.3 lb

## 2020-04-28 DIAGNOSIS — G4733 Obstructive sleep apnea (adult) (pediatric): Secondary | ICD-10-CM | POA: Diagnosis not present

## 2020-04-28 DIAGNOSIS — Z9989 Dependence on other enabling machines and devices: Secondary | ICD-10-CM | POA: Diagnosis not present

## 2020-04-28 NOTE — Patient Instructions (Signed)
Please continue using your CPAP regularly. While your insurance requires that you use CPAP at least 4 hours each night on 70% of the nights, I recommend, that you not skip any nights and use it throughout the night if you can. Getting used to CPAP and staying with the treatment long term does take time and patience and discipline. Untreated obstructive sleep apnea when it is moderate to severe can have an adverse impact on cardiovascular health and raise her risk for heart disease, arrhythmias, hypertension, congestive heart failure, stroke and diabetes. Untreated obstructive sleep apnea causes sleep disruption, nonrestorative sleep, and sleep deprivation. This can have an impact on your day to day functioning and cause daytime sleepiness and impairment of cognitive function, memory loss, mood disturbance, and problems focussing. Using CPAP regularly can improve these symptoms. Please bring your CPAP in at your next convenience so I can review your compliance download. I am glad to hear that you are benefiting from CPAP therapy and have been compliant.  I will write for new supplies and we will send the order to [facility].  We can see you in 1 year, you can see one of our nurse practitioners as you are stable.

## 2020-04-28 NOTE — Progress Notes (Addendum)
Subjective:    Patient ID: Abigail Mendez is a 55 y.o. female.  HPI     Star Age, MD, PhD [facility] [address] [place],  [address]  Dear Clarise Cruz,   I saw your patient, Abigail Mendez, upon your kind request, in my sleep clinic today for reevaluation of her prior diagnosis of obstructive sleep apnea.  The patient is unaccompanied today.  As you know, Abigail Mendez is a 55 year old right-handed woman with an underlying complex medical history of hypertension, obesity, FMS, chronic pain, MDD, PTSD, asthma, sickle cell trait, IBS, arthritis, CTS, tremor and allergies, who reports needing supplies for his CPAP machine.  She particularly needs the connector to the attaches to the mask and the hose.  She is compliant with treatment but did not bring her machine or a card for download.  She indicates good results and full compliance.  She feels that the machine is working well, she is not requesting a new machine.  Her DME company is [employer].  She has been using the P 10 nasal pillows, size small with good success.    I had previously followed her for obstructive sleep apnea.  [facility]  She works part-time as a Company secretary.  She does not drink caffeine or alcohol and is a non-smoker.  She lives alone. Bedtime is generally around 10 PM and rise time around 8 AM.   She has not been seen in this clinic since September 2017.  Addendum, 05/04/20: I received her recent compliance data from the past 30 days between 04/02/2020 and 05/01/2020, she used her machine 30 out of 30 days with percent use days greater than 4 hours at 100%, indicating superb compliance with an average usage of 8 hours and 13 minutes, residual AHI borderline at 6.2/h, leak on the higher side with a 95th percentile at 33 L/min on a pressure of 16 cm with EPR of 2.  Previously:   11/30/2015: She reports that she feels  that the CPAP pressure needs to be adjusted and she's not getting as much sleep as she should. Of note, she presented to the emergency room on 10/17/2015 with headache, she was treated symptomatically and was advised to follow-up with Dr. Ronnald Ramp. Of note, she also presented to the emergency room on 08/22/2015 for shaking spell. She was found to have low potassium. She was advised to follow-up with her primary care physician for potassium recheck in a few days. Of note, she has gained weight since I last saw her, in the realm of 17 pounds. She has lightheadedness and dizziness, has had 3 syncopal spells in 3 years, due for loop recorder placement tomorrow.     I last saw on 11/30/2014, at which time she was doing okay, was compliant with treatment, she had presented to the emergency room on 11/24/2014 with palpitations and subjective concern for A. fib. Her EKG was in keeping with sinus rhythm. I reviewed the emergency room records. She was diagnosed with a urinary tract infection and was advised to monitor his symptoms and follow-up with cardiology for potential Holter monitor. She sees a cardiologist in [facility]. She is on generic Paxil 60 mg daily and no longer on vistaril. She sees multiple specialists. She sees pain management.   I reviewed her CPAP compliance data from 10/30/2015 through 11/28/2015 which is a total of 30 days, during which time she used her  machine 29 days with percent used days greater than 4 hours at 80%, indicating very good compliance with an average usage of 6 hours and 32 minutes, residual AHI borderline at 5.4 per hour, leak low for the 95th percentile at 5.4 L/m on a pressure of 15 cm with EPR of 2.     I saw her on 11/30/2013, at which time she reported trying Vistaril 25 mg to 50 mg per psychiatrist. She had been tried on Ambien which did not help and she tried melatonin which also did not help. She reported a fall in May 2000 2015 and this resulted in a black eye. She  reported that she went to [facility] and had a head CT which per her report was negative. She was no longer on Effexor was on a higher dose of Abilify and had been started on lithium. She reported full compliance with CPAP therapy indicating that it was helping her sleep and her daytime somnolence as well as sleep quality. She still had nocturia about 4 times each night and reported weight gain.   I reviewed her CPAP compliance data from 10/29/2014 through 11/27/2014 which is a total of 30 days during which time she used her machine 30 days with percent used days greater than 4 hours at 80%, indicating very good compliance with an average usage of 5 hours and 49 minutes, residual AHI high at 37.2 per hour, leak I with the 95th percentile at 30.4 L/m on a pressure of 12 cm with EPR of 2. Her residual AHI seems to be high secondary to obstructive apneic events.    I saw her on 05/27/2013, at which time she was compliant with CPAP therapy but reported residual daytime symptoms including non-restorative sleep, sleep disruption and problems achieving and maintaining sleep. This had been a chronic problem. I talked to her about maintaining good sleep hygiene. I advised her against trying additional psychotropic or prescription sleep aids but suggested that she could try melatonin.    I reviewed her compliance data from 09/01/2013 through 11/29/2013 which is the last 90 days during which time she used her machine every night. Percent used days greater than 4 hours was 89%, indicating very good compliance. Average usage was 6 hours and 25 minutes. Residual AHI was low at 0.9 per hour, leak was acceptable at 15.5 L per minute at the 95th percentile. She shares a letter with me that she got from her insurance provider. She says that she may not be able to keep her CPAP machine as her insurance is no longer covering it. She says that she never had a face-to-face after her original sleep study or before  her original sleep study which was in January of last year. I first met her in October of last year after she needed to switch sleep physicians after an insurance change, as understand. I have reviewed her sleep studies from January and February of last year and she has always been compliant with treatment.    I first met her on 01/07/2013 at the request of her primary care physician. She was diagnosed with OSA in 1/14. She reported a change in her insurance coverage and need to change her DME company as well as her sleep specialist. I reviewed her sleep study from 04/17/2012 which showed a sleep efficiency of 70.9%. Sleep latency was 32 minutes and REM latency was increased at 306.5 minutes. She had a decrease in rem sleep. She had a total AHI of 20  per hour. Her baseline oxygen saturation was 98% while awake. Oxygen nadir was 77%. Time below 88% saturation was 4.6% of the study. She had a CPAP titration study on 04/22/2012 and I reviewed the test results: She was noted to have a sleep efficiency of 88.4% with a latency to sleep of 25 minutes and REM latency of 77.6 minutes. She had an increased percentage of REM sleep. Baseline oxygen saturation while awake was 98%, lowest asleep oxygen saturation was 80%. Time below 88% saturation was 4.6% of the study. Her overall AHI was reduced at 3.2 per hour. She had a mild increase in periodic leg movements at 7.6 per hour, with an associated arousal index of 0 per hour. She was titrated on CPAP from 5-12 cm.   She was placed on CPAP in 2/14 and I reviewed the patient's CPAP compliance data from 10/09/12 to 01/06/13 (90 days), during which time the patient used CPAP every day except for 2 days. The average usage for all days was 8 hours and 6 minutes. The percent used days greater than 4 hours was 93%, indicating excellent compliance. The residual AHI was 0.4 per hour, indicating an appropriate treatment pressure of 12 cwp with EPR of 3.   At the time of her first  appointment with me she indicated full compliance with CPAP but still had residual issues with nonrestorative sleep and daytime tiredness. Her ESS was 16/24. She has tried improving her sleep hygiene. She had gained wt on Lyrica and stopped it. She also stopped her Wellbutrin, Zoloft, and Abilify on her own. She has been taking hydrocodone and robaxin for FMS. She has not yet seen a pain specialist, but is in the process of getting a referral. She sees Dr. Estanislado Pandy in rheumatology. She used to go to [facility], but stopped going there on 12/25/12. I suggested that she continue using CPAP regularly. I also talked to her about potentially retesting her.   I reviewed her compliance data from 04/21/2013 through 05/26/2013 which is a total of 36 days during which time she is CPAP every night except for one night. Percent used days greater than 4 hours was 86%, indicating very good compliance with an average usage of 6 hours and 28 minutes. Residual AHI is 1.7 per hour with very low leak. Pressure is 12 cm with EPR of 2.   She had tried Ambien, which did not help her stay asleep, she tried Melatonin 4.5 mg, which did not help. She is on Clonidine for BP, anxiety and nightmares. She is on Baclofen in lieu of Robaxin and is on Effexor XR 150 mg, which started in November 2014. She was re-started on Abilify 10 mg qHS. She has started seeing a pain Physician, Dr. Wess Botts, in [place]. She had been on Seroquel in the past, but gained a lot of weight.    Her Past Medical History Is Significant For: Past Medical History:  Diagnosis Date  . Anemia   . Anxiety   . Asthma   . Chronic pain disorder   . Daytime somnolence 01/07/2013  . Depression   . Diabetes mellitus, type II ([place])   . Fibromyalgia   . GERD (gastroesophageal reflux disease)   . Hypertension   . Hypothyroidism   . MDD (major depressive disorder) 01/07/2013  . Obesity   . OSA on CPAP 01/07/2013  . PTSD (post-traumatic stress disorder)    . Seasonal allergies   . Sickle cell trait ([place])   . Sleep apnea   .  Vaginitis     Her Past Surgical History Is Significant For: Past Surgical History:  Procedure Laterality Date  . NO PAST SURGERIES      Her Family History Is Significant For: Family History  Problem Relation Age of Onset  . Bipolar disorder Sister   . Suicidality Neg Hx     Her Social History Is Significant For: Social History   Socioeconomic History  . Marital status: Single    Spouse name: Not on file  . Number of children: Not on file  . Years of education: Not on file  . Highest education level: Not on file  Occupational History  . Not on file  Tobacco Use  . Smoking status: Never Smoker  . Smokeless tobacco: Never Used  Substance and Sexual Activity  . Alcohol use: No  . Drug use: No  . Sexual activity: Never    Birth control/protection: Abstinence  Other Topics Concern  . Not on file  Social History Narrative  . Not on file   Social Determinants of Health   Financial Resource Strain: Not on file  Food Insecurity: Not on file  Transportation Needs: Not on file  Physical Activity: Not on file  Stress: Not on file  Social Connections: Not on file    Her Allergies Are:  Allergies  Allergen Reactions  . Benztropine Nausea And Vomiting  . Cyclobenzaprine     Hallucinations  . Lactose Intolerance (Gi) Diarrhea  . Lamotrigine     'fall', balance problems  . Latex     Redness,swelling,itching, localized pain  . Lisinopril     cough  . Topamax [Topiramate] Other (See Comments)    Burning in hands and feet  . Tramadol Nausea And Vomiting  . Phenylalanine Rash  :   Her Current Medications Are:  Outpatient Encounter Medications as of 04/28/2020  Medication Sig  . albuterol (PROVENTIL HFA;VENTOLIN HFA) 108 (90 BASE) MCG/ACT inhaler Inhale 2 puffs into the lungs 4 (four) times daily.  Marland Kitchen amantadine (SYMMETREL) 100 MG capsule Take 100 mg by mouth 2 (two) times daily.  .  ARIPiprazole (ABILIFY) 15 MG tablet TAKE 1 TABLET(15 MG) BY MOUTH DAILY (Patient taking differently: 20 mg. TAKE 1 TABLET(15 MG) BY MOUTH DAILY)  . aspirin 81 MG tablet Take 81 mg by mouth daily.  Marland Kitchen atorvastatin (LIPITOR) 10 MG tablet Take 10 mg by mouth daily.  Marland Kitchen b complex vitamins tablet Take 1 tablet by mouth daily.  . Biotin 1000 MCG tablet Take 1,000 mcg by mouth 3 (three) times daily.  . carvedilol (COREG) 6.25 MG tablet Take 6.25 mg by mouth 2 (two) times daily with a meal.  . Cholecalciferol (VITAMIN D3) 1000 UNITS CAPS Take 2 tablets by mouth daily.  . cloNIDine (CATAPRES) 0.1 MG tablet Take 0.1-0.2 tablets by mouth 2 (two) times daily. 1 TAB IN THE AM 2 TABS IN THE PM  . Garlic 2836 MG CAPS Take 5 tablets by mouth daily.  Marland Kitchen glucose blood test strip 1 each by Other route as needed for other. Use as instructed  . HYDROcodone-acetaminophen (NORCO) 10-325 MG tablet Take 1 tablet by mouth every 6 (six) hours as needed.  . hydrOXYzine (ATARAX/VISTARIL) 50 MG tablet Take 50 mg by mouth at bedtime as needed.  . Lactobacillus (PROBIOTIC ACIDOPHILUS) TABS Take 2 tablets by mouth daily.  Marland Kitchen leflunomide (ARAVA) 20 MG tablet Take 20 mg by mouth daily.  Marland Kitchen levothyroxine (SYNTHROID, LEVOTHROID) 50 MCG tablet Take 50 mcg by mouth daily.  Marland Kitchen losartan (  COZAAR) 100 MG tablet Take 100 mg by mouth daily.  . Magnesium (MAGNACAPS PO) Take by mouth.  . montelukast (SINGULAIR) 10 MG tablet Take 10 mg by mouth daily.   . Multiple Vitamin (MULTIVITAMIN WITH MINERALS) TABS tablet Take 1 tablet by mouth daily.  . Pyridoxine HCl (VITAMIN B-6 PO) Take by mouth.  . ranitidine (ZANTAC) 150 MG tablet Take 150 mg by mouth 2 (two) times daily.  Marland Kitchen terbinafine (LAMISIL) 250 MG tablet Take 250 mg by mouth daily.  Marland Kitchen Upadacitinib ER (RINVOQ) 15 MG TB24 Take by mouth.  . Vilazodone HCl (VIIBRYD) 40 MG TABS Take by mouth daily.  Marland Kitchen VITAMIN D PO Take by mouth.  . [DISCONTINUED] acetaminophen (TYLENOL) 500 MG tablet Take 1,000 mg  by mouth every 6 (six) hours as needed for headache.   . [DISCONTINUED] baclofen (LIORESAL) 10 MG tablet Take 10 mg by mouth 3 (three) times daily.   . [DISCONTINUED] buprenorphine (BUTRANS - DOSED MCG/HR) 10 MCG/HR PTWK patch Place 10 mcg onto the skin once a week. On Monday  . [DISCONTINUED] cephALEXin (KEFLEX) 500 MG capsule Take 1 capsule (500 mg total) by mouth 2 (two) times daily.  . [DISCONTINUED] furosemide (LASIX) 20 MG tablet Take 1 tablet (20 mg total) by mouth 2 (two) times daily. (Patient not taking: Reported on 04/28/2020)  . [DISCONTINUED] HYDROcodone-acetaminophen (NORCO/VICODIN) 5-325 MG tablet Take 1-2 tablets by mouth every 4 (four) hours as needed.  . [DISCONTINUED] Melatonin 5 MG TABS Take 5 mg by mouth at bedtime.   . [DISCONTINUED] metFORMIN (GLUCOPHAGE) 500 MG tablet Take 500 mg by mouth daily with breakfast.   . [DISCONTINUED] methocarbamol (ROBAXIN) 500 MG tablet Take by mouth.  . [DISCONTINUED] mometasone-formoterol (DULERA) 200-5 MCG/ACT AERO Inhale 2 puffs into the lungs 2 (two) times daily.  . [DISCONTINUED] PARoxetine (PAXIL) 40 MG tablet TAKE 1 AND 1/2 TABLETS(60 MG) BY MOUTH DAILY  . [DISCONTINUED] potassium bicarbonate (K-LYTE) 25 MEQ disintegrating tablet Take 50 mEq by mouth 2 (two) times daily.  . [DISCONTINUED] valsartan (DIOVAN) 160 MG tablet    No facility-administered encounter medications on file as of 04/28/2020.  :  Review of Systems:  Out of a complete 14 point review of systems, all are reviewed and negative with the exception of these symptoms as listed below: Review of Systems  Neurological:       Pt here for sleep consult. Prior sleep study- pt is here to address supplies. Pt did not bring SD card or pap machine.  Epworth Sleepiness Scale 0= would never doze 1= slight chance of dozing 2= moderate chance of dozing 3= high chance of dozing  Sitting and reading:1 Watching TV:2 Sitting inactive in a public place (ex. Theater or meeting):0 As a  passenger in a car for an hour without a break:1 Lying down to rest in the afternoon:2 Sitting and talking to someone:0 Sitting quietly after lunch (no alcohol):0 In a car, while stopped in traffic:0 Total:6     Objective:  Neurological Exam  Physical Exam Physical Examination:   Vitals:   04/28/20 1228  BP: 128/84  Pulse: 87  SpO2: 98%    General Examination: The patient is a very pleasant 55 y.o. female in no acute distress. She appears well-developed and well-nourished and well groomed.   HEENT: Normocephalic, atraumatic, pupils are equal, round and reactive to light, extraocular tracking is well preserved, face is symmetric, perhaps mild facial masking noted, no voice tremor, no dysarthria.  Airway examination reveals mild mouth dryness, tongue protrudes  centrally and palate elevates symmetrically.  Moderate airway crowding noted.   Chest: Clear to auscultation without wheezing, rhonchi or crackles noted.  Heart: S1+S2+0, regular and normal without murmurs, rubs or gallops noted.   Abdomen: Soft, non-tender and non-distended with normal bowel sounds appreciated on auscultation.  Extremities: There is no pitting edema in the distal lower extremities bilaterally.   Skin: Warm and dry without trophic changes noted. There are no varicose veins.  Musculoskeletal: exam reveals: She reports midline low back pain.   Neurologically:  Mental status: The patient is awake, alert and oriented in all 4 spheres. Her memory, attention, language and knowledge are appropriate. Cranial nerves are as described above under HEENT exam.  Motor exam: Normal bulk, moving all 4 extremities, mild resting tremor in both upper extremities. Fine motor skills are well preserved.  Sensory exam is intact to light touch.  Gait, station and balance: She stands mildly slowly, she does stand slightly wide-based and walks slowly and cautiously.    Assessment and Plan:   In summary, Abigail Mendez  is a 55 year old right-handed woman with an underlying complex medical history of hypertension, obesity, FMS, chronic pain, MDD, PTSD, asthma, sickle cell trait, IBS, arthritis, CTS, tremor and allergies, who presents for reevaluation of her obstructive sleep apnea.  She had sleep testing in January 2014 and has been on CPAP therapy.  Most recent compliance is not available for my review today but she is willing to bring back the machine for download.  She reports full compliance and good results with sustained benefit noted.  She is in need for new supplies.  Her DME company is [employer].  I would be happy to write for new supplies.  She does not feel the need to get a new machine as her current machine has been working well for her.  She is advised to follow-up routinely in sleep clinic to see one of our nurse practitioners in 1 year.  I answered all her questions today and she was in agreement. Thank you very much for allowing me to participate in the care of this nice patient. If I can be of any further assistance to you please do not hesitate to call me at [phone].  Sincerely,   Star Age, MD, PhD

## 2020-05-03 DIAGNOSIS — Z6841 Body Mass Index (BMI) 40.0 and over, adult: Secondary | ICD-10-CM | POA: Insufficient documentation

## 2020-05-10 ENCOUNTER — Telehealth: Payer: Self-pay | Admitting: Neurology

## 2020-05-10 NOTE — Telephone Encounter (Signed)
Pt has called back to inform Megan,RN that she has not heard back from [place] yet, please call.

## 2020-05-10 NOTE — Telephone Encounter (Signed)
I called pt and advised I have reached out to [employer] to get information on order we sent.  Pt was advised I would keep her up to date on the information I receive.

## 2020-05-12 NOTE — Telephone Encounter (Signed)
I reached out to [employer] this morning and was advised the re order for pt's supplies is in process. I faxed the order from 04/29/20 again just incase the order was not received and was needed again.  I called pt and advised of information from [place] and advised she should be hearing something soon.  Pt was advised to call the first of next week if she has not heard and we could reassess. Pt was agreeable.

## 2020-05-16 NOTE — Telephone Encounter (Signed)
I called [employer] and spoke with Wells Guiles, she advised someone from the CPAP resupply company called the patient on Friday to 25 2022.  Reports representative was unable to leave a voicemail for the patient.  Wells Guiles did give me a number for the patient to call directly to set CPAP supply order.  Number is [phone].  I called the patient back and advised that the information received fax back.  Patient will call CPAP supply recorder # and let me know if she has any issues

## 2020-05-16 NOTE — Telephone Encounter (Signed)
Pt. has still not heard from [place]. Please advise.

## 2020-08-11 ENCOUNTER — Other Ambulatory Visit: Payer: Self-pay | Admitting: Physician Assistant

## 2020-08-11 ENCOUNTER — Other Ambulatory Visit: Payer: Self-pay | Admitting: *Deleted

## 2020-08-11 DIAGNOSIS — Z1231 Encounter for screening mammogram for malignant neoplasm of breast: Secondary | ICD-10-CM

## 2020-09-20 ENCOUNTER — Other Ambulatory Visit: Payer: Self-pay

## 2020-09-20 ENCOUNTER — Ambulatory Visit
Admission: RE | Admit: 2020-09-20 | Discharge: 2020-09-20 | Disposition: A | Discharge status: Home / Self Care | Payer: Medicare HMO | Source: Ambulatory Visit | Attending: Physician Assistant | Admitting: Physician Assistant

## 2020-09-20 DIAGNOSIS — Z1231 Encounter for screening mammogram for malignant neoplasm of breast: Secondary | ICD-10-CM

## 2021-01-10 ENCOUNTER — Telehealth: Payer: Self-pay | Admitting: Neurology

## 2021-01-10 ENCOUNTER — Telehealth: Payer: Self-pay | Admitting: *Deleted

## 2021-01-10 DIAGNOSIS — Z9989 Dependence on other enabling machines and devices: Secondary | ICD-10-CM

## 2021-01-10 NOTE — Telephone Encounter (Signed)
Patient called the  motor on CPAP has a message exceeded on it . Forward message to Dr.Ather to please advise .

## 2021-01-10 NOTE — Telephone Encounter (Signed)
Pt called states her CPAP machine is displaying a message that the motor has exceeded and instructed to reach out to her providers office. Pt requesting a call back regarding what to do.

## 2021-01-11 NOTE — Telephone Encounter (Signed)
Spoke to patient this am to make her aware that Dr.Ather is ordering a new CPAP machine and supplies. Informed patient of the importance of staying complaint for her CPAP and keep her follow up appointments. Informed patient if not complaint insurance will not pay for machine. Did make patient aware she has to make a appointment 31-89 days after she starts her new machine. Did offer to make appointment ,patient declined and said she will call back to make her appointment.. Patient thanked me for calling. Will fax new order this am to [place].

## 2021-01-11 NOTE — Addendum Note (Signed)
Addended by: Star Age on: 01/11/2021 07:02 AM   Modules accepted: Orders

## 2021-01-11 NOTE — Telephone Encounter (Signed)
I have written for a new CPAP machine.  Please send order to her DME company.  Please advise patient about the importance of compliance and the need for a follow-up appointment within 31 to 89 days after starting treatment with the new machine.  You can schedule the follow-up appointment with one of our nurse practitioners or me.

## 2021-04-27 DIAGNOSIS — N3281 Overactive bladder: Secondary | ICD-10-CM | POA: Insufficient documentation

## 2021-05-01 ENCOUNTER — Ambulatory Visit: Payer: Medicare HMO | Admitting: Adult Health

## 2021-05-01 VITALS — BP 164/87 | HR 66 | Ht 65.0 in | Wt 264.6 lb

## 2021-05-01 DIAGNOSIS — Z9989 Dependence on other enabling machines and devices: Secondary | ICD-10-CM

## 2021-05-01 DIAGNOSIS — G4733 Obstructive sleep apnea (adult) (pediatric): Secondary | ICD-10-CM | POA: Diagnosis not present

## 2021-05-01 NOTE — Patient Instructions (Signed)
Continue using CPAP nightly and greater than 4 hours each night Try adjusting humidification- if no improvement can do a mask refitting If your symptoms worsen or you develop new symptoms please let us know.

## 2021-05-01 NOTE — Progress Notes (Signed)
PATIENT: Abigail Mendez DOB: [birth year]  REASON FOR VISIT: follow up HISTORY FROM: patient PRIMARY NEUROLOGIST: Dr. Rexene Alberts  HISTORY OF PRESENT ILLNESS: Today 05/01/21:  Abigail Mendez is a 56 year old female with a history of obstructive sleep apnea on CPAP.  She returns today for CPAP follow-up.  She reports that she did receive a new machine.  Reports that she does have a dry mouth.  But has not tried adjusting her humidity.  She returns today for follow-up.     REVIEW OF SYSTEMS: Out of a complete 14 system review of symptoms, the patient complains only of the following symptoms, and all other reviewed systems are negative.  FSS 43 ESS 3  ALLERGIES: Allergies  Allergen Reactions   Benztropine Nausea And Vomiting   Cyclobenzaprine     Hallucinations   Lactose Intolerance (Gi) Diarrhea   Lamotrigine     'fall', balance problems   Latex     Redness,swelling,itching, localized pain   Lisinopril     cough   Topamax [Topiramate] Other (See Comments)    Burning in hands and feet   Tramadol Nausea And Vomiting   Phenylalanine Rash   Sarilumab Itching    HOME MEDICATIONS: Outpatient Medications Prior to Visit  Medication Sig Dispense Refill   albuterol (PROVENTIL HFA;VENTOLIN HFA) 108 (90 BASE) MCG/ACT inhaler Inhale 2 puffs into the lungs 4 (four) times daily.     amantadine (SYMMETREL) 100 MG capsule Take 1 capsule by mouth 2 (two) times daily.     ARIPiprazole (ABILIFY) 15 MG tablet TAKE 1 TABLET(15 MG) BY MOUTH DAILY (Patient taking differently: 20 mg. TAKE 1 TABLET(15 MG) BY MOUTH DAILY) 90 tablet 0   aspirin 81 MG tablet Take 81 mg by mouth daily.     atorvastatin (LIPITOR) 10 MG tablet Take 10 mg by mouth daily.     b complex vitamins tablet Take 1 tablet by mouth daily.     Biotin 1000 MCG tablet Take 1,000 mcg by mouth 3 (three) times daily.     carvedilol (COREG) 6.25 MG tablet Take 6.25 mg by mouth 2 (two) times daily with a meal.     Cholecalciferol (VITAMIN  D3) 1000 UNITS CAPS Take 2 tablets by mouth daily.     cloNIDine (CATAPRES) 0.1 MG tablet Take 0.1-0.2 tablets by mouth 2 (two) times daily. 1 TAB IN THE AM 2 TABS IN THE PM     gabapentin (NEURONTIN) 100 MG capsule Take 100 mg by mouth in the morning, at noon, and at bedtime.     Garlic 1610 MG CAPS Take 5 tablets by mouth daily.     glucose blood test strip 1 each by Other route as needed for other. Use as instructed     hydrOXYzine (ATARAX/VISTARIL) 50 MG tablet Take 50 mg by mouth at bedtime as needed.     Lactobacillus (PROBIOTIC ACIDOPHILUS) TABS Take 2 tablets by mouth daily.     leflunomide (ARAVA) 20 MG tablet Take 20 mg by mouth daily.     levothyroxine (SYNTHROID, LEVOTHROID) 50 MCG tablet Take 50 mcg by mouth daily.     losartan (COZAAR) 100 MG tablet Take 100 mg by mouth daily.     Magnesium (MAGNACAPS PO) Take by mouth.     Multiple Vitamin (MULTIVITAMIN WITH MINERALS) TABS tablet Take 1 tablet by mouth daily.     Pyridoxine HCl (VITAMIN B-6 PO) Take by mouth.     ranitidine (ZANTAC) 150 MG tablet Take 150 mg by mouth 2 (  two) times daily.     solifenacin (VESICARE) 5 MG tablet      Vilazodone HCl (VIIBRYD) 40 MG TABS Take by mouth daily.     VITAMIN D PO Take by mouth.     amantadine (SYMMETREL) 100 MG capsule Take 100 mg by mouth 2 (two) times daily.     HYDROcodone-acetaminophen (NORCO) 10-325 MG tablet Take 1 tablet by mouth every 6 (six) hours as needed.     montelukast (SINGULAIR) 10 MG tablet Take 10 mg by mouth daily.      terbinafine (LAMISIL) 250 MG tablet Take 250 mg by mouth daily.     Upadacitinib ER (RINVOQ) 15 MG TB24 Take by mouth.     No facility-administered medications prior to visit.    PAST MEDICAL HISTORY: Past Medical History:  Diagnosis Date   Anemia    Anxiety    Asthma    Chronic pain disorder    Daytime somnolence 01/07/2013   Depression    Diabetes mellitus, type II ([place])    Fibromyalgia    GERD (gastroesophageal reflux disease)     Hypertension    Hypothyroidism    MDD (major depressive disorder) 01/07/2013   Obesity    OSA on CPAP 01/07/2013   PTSD (post-traumatic stress disorder)    Seasonal allergies    Sickle cell trait ([place])    Sleep apnea    Vaginitis     PAST SURGICAL HISTORY: Past Surgical History:  Procedure Laterality Date   NO PAST SURGERIES      FAMILY HISTORY: Family History  Problem Relation Age of Onset   Breast cancer Sister 59   Bipolar disorder Sister    Suicidality Neg Hx     SOCIAL HISTORY: Social History   Socioeconomic History   Marital status: Single    Spouse name: Not on file   Number of children: Not on file   Years of education: Not on file   Highest education level: Not on file  Occupational History   Not on file  Tobacco Use   Smoking status: Never   Smokeless tobacco: Never  Substance and Sexual Activity   Alcohol use: No   Drug use: No   Sexual activity: Never    Birth control/protection: Abstinence  Other Topics Concern   Not on file  Social History Narrative   Not on file   Social Determinants of Health   Financial Resource Strain: Not on file  Food Insecurity: Not on file  Transportation Needs: Not on file  Physical Activity: Not on file  Stress: Not on file  Social Connections: Not on file  Intimate Partner Violence: Not on file      PHYSICAL EXAM  Vitals:   05/01/21 0812  BP: (!) 164/87  Pulse: 66  Weight: 264 lb 9.6 oz (120 kg)  Height: 5\' 5"  (1.651 m)   Body mass index is 44.03 kg/m.  Generalized: Well developed, in no acute distress  Chest: Lungs clear to auscultation bilaterally  Neurological examination  Mentation: Alert oriented to time, place, history taking. Follows all commands speech and language fluent Cranial nerve II-XII: Extraocular movements were full, visual field were full on confrontational test Head turning and shoulder shrug  were normal and symmetric. Motor: The motor testing reveals 5 over 5 strength of all  4 extremities. Good symmetric motor tone is noted throughout.  Sensory: Sensory testing is intact to soft touch on all 4 extremities. No evidence of extinction is noted.  Gait and station: Gait  is normal.    DIAGNOSTIC DATA (LABS, IMAGING, TESTING) - I reviewed patient records, labs, notes, testing and imaging myself where available.  Lab Results  Component Value Date   WBC 11.5 (H) 08/22/2015   HGB 11.6 (L) 08/22/2015   HCT 35.5 (L) 08/22/2015   MCV 83.1 08/22/2015   PLT 302 08/22/2015      Component Value Date/Time   NA 137 08/22/2015 1900   K 3.2 (L) 08/22/2015 1900   CL 98 (L) 08/22/2015 1900   CO2 29 08/22/2015 1900   GLUCOSE 101 (H) 08/22/2015 1900   BUN 13 08/22/2015 1900   CREATININE 1.64 (H) 08/22/2015 1900   CALCIUM 9.3 08/22/2015 1900   PROT 8.9 (H) 05/20/2010 1207   ALBUMIN 4.3 05/20/2010 1207   AST 35 05/20/2010 1207   ALT 34 05/20/2010 1207   ALKPHOS 87 05/20/2010 1207   BILITOT 0.8 05/20/2010 1207   GFRNONAA 35 (L) 08/22/2015 1900   GFRAA 41 (L) 08/22/2015 1900    Lab Results  Component Value Date   TSH 2.039 02/01/2010      ASSESSMENT AND PLAN 56 y.o. year old female  has a past medical history of Anemia, Anxiety, Asthma, Chronic pain disorder, Daytime somnolence (01/07/2013), Depression, Diabetes mellitus, type II ([place]), Fibromyalgia, GERD (gastroesophageal reflux disease), Hypertension, Hypothyroidism, MDD (major depressive disorder) (01/07/2013), Obesity, OSA on CPAP (01/07/2013), PTSD (post-traumatic stress disorder), Seasonal allergies, Sickle cell trait ([place]), Sleep apnea, and Vaginitis. here with:  OSA on CPAP  - CPAP compliance excellent - Good treatment of AHI  - Encourage patient to use CPAP nightly and > 4 hours each night - F/U in 1 year or sooner if needed   Ward Givens, MSN, NP-C 05/01/2021, 8:46 AM [facility] [address] [place], [place] [address] [phone]

## 2021-07-03 ENCOUNTER — Other Ambulatory Visit: Payer: Self-pay | Admitting: Physician Assistant

## 2021-07-03 DIAGNOSIS — E2839 Other primary ovarian failure: Secondary | ICD-10-CM

## 2021-08-04 ENCOUNTER — Other Ambulatory Visit: Payer: Self-pay | Admitting: Physician Assistant

## 2021-08-04 ENCOUNTER — Encounter: Payer: Self-pay | Admitting: Adult Health

## 2021-08-04 DIAGNOSIS — Z1231 Encounter for screening mammogram for malignant neoplasm of breast: Secondary | ICD-10-CM

## 2021-09-21 ENCOUNTER — Ambulatory Visit
Admission: RE | Admit: 2021-09-21 | Discharge: 2021-09-21 | Disposition: A | Discharge status: Home / Self Care | Payer: Medicare HMO | Source: Ambulatory Visit | Attending: Physician Assistant | Admitting: Physician Assistant

## 2021-09-21 DIAGNOSIS — Z1231 Encounter for screening mammogram for malignant neoplasm of breast: Secondary | ICD-10-CM

## 2021-12-01 DIAGNOSIS — K439 Ventral hernia without obstruction or gangrene: Secondary | ICD-10-CM | POA: Insufficient documentation

## 2021-12-04 ENCOUNTER — Ambulatory Visit: Payer: Medicare HMO | Admitting: Podiatry

## 2021-12-04 ENCOUNTER — Encounter: Payer: Self-pay | Admitting: Podiatry

## 2021-12-04 DIAGNOSIS — S90852A Superficial foreign body, left foot, initial encounter: Secondary | ICD-10-CM

## 2021-12-04 DIAGNOSIS — Z87898 Personal history of other specified conditions: Secondary | ICD-10-CM | POA: Insufficient documentation

## 2021-12-04 DIAGNOSIS — G5600 Carpal tunnel syndrome, unspecified upper limb: Secondary | ICD-10-CM | POA: Insufficient documentation

## 2021-12-04 DIAGNOSIS — N289 Disorder of kidney and ureter, unspecified: Secondary | ICD-10-CM | POA: Insufficient documentation

## 2021-12-04 NOTE — Progress Notes (Signed)
Subjective:  Patient ID: [name], female    DOB: 1965/12/05,  MRN: [id number] HPI Chief Complaint  Patient presents with   Foot Pain    Posterior heel left - small, callused area x 3 months, gotten bigger, tender, no treatment, diabetic - last a1c was 5.2   New Patient (Initial Visit)    56 y.o. female presents with the above complaint.   ROS: Denies fever chills nausea vomiting muscle aches pains Denies fever chills nausea vomiting muscle aches pains calf pain back pain chest pain shortness of breath.  Past Medical History:  Diagnosis Date   Anemia    Anxiety    Asthma    Chronic pain disorder    Daytime somnolence 01/07/2013   Depression    Diabetes mellitus, type II ([place])    Fibromyalgia    GERD (gastroesophageal reflux disease)    Hypertension    Hypothyroidism    MDD (major depressive disorder) 01/07/2013   Obesity    OSA on CPAP 01/07/2013   PTSD (post-traumatic stress disorder)    Seasonal allergies    Sickle cell trait (HCC)    Sleep apnea    Vaginitis    Past Surgical History:  Procedure Laterality Date   NO PAST SURGERIES      Current Outpatient Medications:    hydrOXYzine (VISTARIL) 25 MG/ML injection, , Disp: , Rfl:    aspirin 81 MG tablet, Take 81 mg by mouth daily., Disp: , Rfl:    atorvastatin (LIPITOR) 10 MG tablet, Take 10 mg by mouth daily., Disp: , Rfl:    b complex vitamins tablet, Take 1 tablet by mouth daily., Disp: , Rfl:    Biotin 1000 MCG tablet, Take 1,000 mcg by mouth 3 (three) times daily., Disp: , Rfl:    carvedilol (COREG) 6.25 MG tablet, Take 6.25 mg by mouth 2 (two) times daily with a meal., Disp: , Rfl:    Cholecalciferol (VITAMIN D3) 1000 UNITS CAPS, Take 2 tablets by mouth daily., Disp: , Rfl:    dicyclomine (BENTYL) 10 MG capsule, SMARTSIG:1 Capsule(s) By Mouth, Disp: , Rfl:    furosemide (LASIX) 20 MG tablet, Take 20 mg by mouth 2 (two) times daily., Disp: , Rfl:    glucose blood test strip, 1 each by Other route as  needed for other. Use as instructed, Disp: , Rfl:    leflunomide (ARAVA) 20 MG tablet, Take 20 mg by mouth daily., Disp: , Rfl:    levothyroxine (SYNTHROID, LEVOTHROID) 50 MCG tablet, Take 50 mcg by mouth daily., Disp: , Rfl:    losartan (COZAAR) 100 MG tablet, Take 100 mg by mouth daily., Disp: , Rfl:    Magnesium (MAGNACAPS PO), Take by mouth., Disp: , Rfl:    Multiple Vitamin (MULTIVITAMIN WITH MINERALS) TABS tablet, Take 1 tablet by mouth daily., Disp: , Rfl:    MYRBETRIQ 25 MG TB24 tablet, Take 25 mg by mouth daily., Disp: , Rfl:    ORENCIA CLICKJECT 315 MG/ML SOAJ, Inject into the skin., Disp: , Rfl:    Pyridoxine HCl (VITAMIN B-6 PO), Take by mouth., Disp: , Rfl:    RYBELSUS 3 MG TABS, Take 1 tablet by mouth daily., Disp: , Rfl:    Vilazodone HCl (VIIBRYD) 40 MG TABS, Take by mouth daily., Disp: , Rfl:    VITAMIN D PO, Take by mouth., Disp: , Rfl:   Allergies  Allergen Reactions   Benztropine Mesylate     Other reaction(s): Unknown   Lactose  Other reaction(s): Unknown   Tizanidine Hcl Other (See Comments)    Other reaction(s): Unknown   Amlodipine Swelling    Other reaction(s): Unknown   Benztropine Nausea And Vomiting   Cyclobenzaprine     Hallucinations   Gabapentin     Other reaction(s): Unknown   Lactose Intolerance (Gi) Diarrhea   Lamotrigine     'fall', balance problems   Latex     Redness,swelling,itching, localized pain   Lisinopril     cough   Pregabalin Other (See Comments)   Topamax [Topiramate] Other (See Comments)    Burning in hands and feet   Tramadol Nausea And Vomiting   Trazodone Hcl Other (See Comments)    Other reaction(s): Unknown   Phenylalanine Rash   Sarilumab Itching   Review of Systems Objective:  There were no vitals filed for this visit.  General: Well developed, nourished, in no acute distress, alert and oriented x3   Dermatological: Skin is warm, dry and supple bilateral. Nails x 10 are well maintained; remaining integument  appears unremarkable at this time. There are no open sores, no preulcerative lesions, no rash or signs of infection present.  The area on the plantar posterior inferior aspect of the left heel initially appeared to be a poor keratoma however upon debridement was a small hard foreign body that had a small area purulence around it.  I have is mildly tender on debridement and removal.  There is a small amount of bleeding there is no signs of cellulitis or progressive infection.  Vascular: Dorsalis Pedis artery and Posterior Tibial artery pedal pulses are 2/4 bilateral with immedate capillary fill time. Pedal hair growth present. No varicosities and no lower extremity edema present bilateral.   Neruologic: Grossly intact via light touch bilateral. Vibratory intact via tuning fork bilateral. Protective threshold with Semmes Wienstein monofilament intact to all pedal sites bilateral. Patellar and Achilles deep tendon reflexes 2+ bilateral. No Babinski or clonus noted bilateral.   Musculoskeletal: No gross boney pedal deformities bilateral. No pain, crepitus, or limitation noted with foot and ankle range of motion bilateral. Muscular strength 5/5 in all groups tested bilateral.  Gait: Unassisted, Nonantalgic.    Radiographs:  None taken  Assessment & Plan:   Assessment: Foreign body left heel.  Plan: Debridement of soft tissue lesion today turned out to be a small occlusive foreign body.  I placed a dry sterile dressing after thorough debridement and alcohol clearance.  I will follow-up with her on an as-needed basis encouraged her to continue to watch this regularly.     Vernisha Bacote T. [place], [place]

## 2021-12-06 ENCOUNTER — Encounter: Payer: Self-pay | Admitting: Pulmonary Disease

## 2021-12-06 ENCOUNTER — Ambulatory Visit (INDEPENDENT_AMBULATORY_CARE_PROVIDER_SITE_OTHER): Payer: Medicare HMO | Admitting: Pulmonary Disease

## 2021-12-06 VITALS — BP 130/78 | HR 102 | Ht 65.0 in | Wt 248.0 lb

## 2021-12-06 DIAGNOSIS — J4530 Mild persistent asthma, uncomplicated: Secondary | ICD-10-CM

## 2021-12-06 DIAGNOSIS — G4733 Obstructive sleep apnea (adult) (pediatric): Secondary | ICD-10-CM | POA: Diagnosis not present

## 2021-12-06 DIAGNOSIS — R918 Other nonspecific abnormal finding of lung field: Secondary | ICD-10-CM | POA: Diagnosis not present

## 2021-12-06 DIAGNOSIS — Z9989 Dependence on other enabling machines and devices: Secondary | ICD-10-CM

## 2021-12-06 MED ORDER — FLUTICASONE-SALMETEROL 100-50 MCG/ACT IN AEPB: 1.0000 | INHALATION_SPRAY | Freq: Two times a day (BID) | RESPIRATORY_TRACT | 5 refills | Status: DC

## 2021-12-06 MED ORDER — MONTELUKAST SODIUM 10 MG PO TABS: 10.0000 mg | ORAL_TABLET | Freq: Every day | ORAL | 11 refills | Status: DC

## 2021-12-06 MED ORDER — ALBUTEROL SULFATE HFA 108 (90 BASE) MCG/ACT IN AERS: 2.0000 | INHALATION_SPRAY | Freq: Four times a day (QID) | RESPIRATORY_TRACT | 2 refills | Status: DC | PRN

## 2021-12-06 NOTE — Progress Notes (Signed)
Subjective:   PATIENT ID: Abigail Mendez GENDER: female DOB: 05/22/1965, MRN: [id number]   HPI  Chief Complaint  Patient presents with   Consult    Cxr 2021 pulm nodes states asthma controlled     Reason for Visit: New consult for pulmonary nodules, asthma  Ms. Abigail Mendez is a 56 year old female never smoker with adult-onset asthma, CKD IIIa, RA on , DM2, HTN, chronic anemia who presents for establish pulmonary care.  She was last seen by a Pulmonologist in 2014 for asthma, sleep apnea and reported hx of pulmonary nodules. Reviewed CT reports in [place] as noted below.  She reports shortness of breath with chest tightness. Not currently having wheezing but will have it when symptoms are severe. Associated with nasal congestion at night. Shortness of breath worsens at night. Occasional cough. No known triggers for this current episode. Previously triggered by animal dander, cigarette smoking, humidity, cold air and illness. She is currently on albuterol and rarely uses it. Not currently on maintenance inhalers. Previously on Advair.       No data to display         Social History: Never smoker  I have personally reviewed patient's past medical/family/social history, allergies, current medications.  Past Medical History:  Diagnosis Date   Anemia    Anxiety    Asthma    Chronic pain disorder    Daytime somnolence 01/07/2013   Depression    Diabetes mellitus, type II ([place])    Fibromyalgia    GERD (gastroesophageal reflux disease)    Hypertension    Hypothyroidism    MDD (major depressive disorder) 01/07/2013   Obesity    OSA on CPAP 01/07/2013   PTSD (post-traumatic stress disorder)    Seasonal allergies    Sickle cell trait (HCC)    Sleep apnea    Vaginitis      Family History  Problem Relation Age of Onset   Breast cancer Sister 49   Bipolar disorder Sister    Suicidality Neg Hx      Social History   Occupational History   Not on file   Tobacco Use   Smoking status: Never   Smokeless tobacco: Never  Substance and Sexual Activity   Alcohol use: No   Drug use: No   Sexual activity: Never    Birth control/protection: Abstinence    Allergies  Allergen Reactions   Benztropine Mesylate     Other reaction(s): Unknown   Lactose     Other reaction(s): Unknown   Tizanidine Hcl Other (See Comments)    Other reaction(s): Unknown   Amlodipine Swelling    Other reaction(s): Unknown   Benztropine Nausea And Vomiting   Cyclobenzaprine     Hallucinations   Gabapentin     Other reaction(s): Unknown   Lactose Intolerance (Gi) Diarrhea   Lamotrigine     'fall', balance problems   Latex     Redness,swelling,itching, localized pain   Lisinopril     cough   Pregabalin Other (See Comments)   Topamax [Topiramate] Other (See Comments)    Burning in hands and feet   Tramadol Nausea And Vomiting   Trazodone Hcl Other (See Comments)    Other reaction(s): Unknown   Phenylalanine Rash   Sarilumab Itching     Outpatient Medications Prior to Visit  Medication Sig Dispense Refill   aspirin 81 MG tablet Take 81 mg by mouth daily.     atorvastatin (LIPITOR) 10 MG tablet Take 10  mg by mouth daily.     b complex vitamins tablet Take 1 tablet by mouth daily.     Biotin 1000 MCG tablet Take 1,000 mcg by mouth 3 (three) times daily.     carvedilol (COREG) 6.25 MG tablet Take 6.25 mg by mouth 2 (two) times daily with a meal.     Cholecalciferol (VITAMIN D3) 1000 UNITS CAPS Take 2 tablets by mouth daily.     furosemide (LASIX) 20 MG tablet Take 20 mg by mouth 2 (two) times daily.     Ginkgo Biloba 40 MG TABS Take 60 mg by mouth.     glucose blood test strip 1 each by Other route as needed for other. Use as instructed     hydrOXYzine (VISTARIL) 25 MG/ML injection 75 mg. At bedtime     leflunomide (ARAVA) 20 MG tablet Take 20 mg by mouth daily.     levothyroxine (SYNTHROID, LEVOTHROID) 50 MCG tablet Take 50 mcg by mouth daily.      losartan (COZAAR) 100 MG tablet Take 100 mg by mouth daily.     Magnesium (MAGNACAPS PO) Take by mouth.     Multiple Vitamin (MULTIVITAMIN WITH MINERALS) TABS tablet Take 1 tablet by mouth daily.     MYRBETRIQ 25 MG TB24 tablet Take 25 mg by mouth daily.     ORENCIA CLICKJECT 284 MG/ML SOAJ Inject into the skin.     oxyCODONE-acetaminophen (PERCOCET) 10-325 MG tablet Take 1 tablet by mouth every 4 (four) hours as needed for pain.     POTASSIUM CHLORIDE ER PO Take 10 mg by mouth.     Pyridoxine HCl (VITAMIN B-6 PO) Take by mouth.     RYBELSUS 3 MG TABS Take 1 tablet by mouth daily. Taking '7mg'$      Vilazodone HCl (VIIBRYD) 40 MG TABS Take by mouth daily.     VITAMIN D PO Take by mouth.     dicyclomine (BENTYL) 10 MG capsule SMARTSIG:1 Capsule(s) By Mouth (Patient not taking: Reported on 12/06/2021)     No facility-administered medications prior to visit.    Review of Systems  Constitutional:  Negative for chills, diaphoresis, fever, malaise/fatigue and weight loss.  HENT:  Negative for congestion.   Respiratory:  Positive for cough, shortness of breath and wheezing. Negative for hemoptysis and sputum production.   Cardiovascular:  Negative for chest pain, palpitations and leg swelling.     Objective:   Vitals:   12/06/21 1025  BP: 130/78  Pulse: (!) 102  SpO2: 100%  Weight: 248 lb (112.5 kg)  Height: '5\' 5"'$  (1.651 m)   SpO2: 100 % O2 Device: None (Room air)  Physical Exam: General: Well-appearing, no acute distress HENT: [place], AT Eyes: EOMI, no scleral icterus Respiratory: Clear to auscultation bilaterally.  No crackles, wheezing or rales Cardiovascular: RRR, -M/R/G, no JVD Extremities:-Edema,-tenderness Neuro: AAO x4, CNII-XII grossly intact Psych: Normal mood, normal affect  Data Reviewed:  Imaging: CT Chest 08/11/12  [facility] (report only)- No adenopathy. "No change in 1.1 cm nodule or scar at the right lung base in image 86. Overall area of groundglass opacity in the  lingular around image 64 is increased. There are 2 new areas of groundglass opacity in the right middle lobe"  PFT: None of file  Labs: CBC    Component Value Date/Time   WBC 11.5 (H) 08/22/2015 1900   RBC 4.27 08/22/2015 1900   HGB 11.6 (L) 08/22/2015 1900   HCT 35.5 (L) 08/22/2015 1900   PLT 302 08/22/2015  1900   MCV 83.1 08/22/2015 1900   MCH 27.2 08/22/2015 1900   MCHC 32.7 08/22/2015 1900   RDW 15.1 08/22/2015 1900   LYMPHSABS 2.7 05/20/2010 1207   MONOABS 0.9 05/20/2010 1207   EOSABS 0.0 05/20/2010 1207   BASOSABS 0.0 05/20/2010 1207  Chronic anemia  Sleep: PS[facility] 10/01/21 - Unable to view records     Assessment & Plan:   Discussion: 56 year old female never smoker with adult-onset asthma, CKD IIIa, RA on , DM2, HTN, chronic anemia who presents for establish pulmonary care. Uncontrolled asthma. Discussed clinical course and management of asthma including bronchodilator regimen and action plan for exacerbation.  Patient with hx of pulmonary nodules and ground glass opacities seen on CT in 2014 with no follow-up imaging found.  Mild persistent asthma, not in exacerbation --START Advair Diskus 100-50 mcg ONE puff in the morning and evening. Rinse out mouth after use --CONTINUE Albuterol AS NEEDED for shortness of breath, wheezing, chest tightness --START montelukast 10 mg daily --Consider PFTs in the future  Right lung nodule Groundglass opacities in RML --ORDER CT Chest without contrast  OSA --Continue CPAP daily --REQUEST Sleep test results from 10/01/17 from [facility] Maintenance Immunization History  Administered Date(s) Administered   Influenza Inj Mdck Quad Pf 01/03/2021   Influenza Split 01/08/2012, 12/25/2012, 12/24/2015, 01/02/2021   Influenza, Quadrivalent, Recombinant, Inj, Pf 12/18/2019   Influenza,inj,Quad PF,6+ Mos 12/18/2019   Influenza,inj,Quad PF,6-35 Mos 01/06/2019   Influenza,trivalent, recombinat, inj, PF 12/10/2014, 01/01/2017    Influenza-Unspecified 01/10/2011, 12/24/2015, 01/03/2017, 12/31/2017   PFIZER Comirnaty(Gray Top)Covid-19 Tri-Sucrose Vaccine 10/20/2020   PFIZER(Purple Top)SARS-COV-2 Vaccination 06/18/2019, 07/09/2019, 11/19/2019, 11/21/2019   Pfizer Covid-19 Vaccine Bivalent Booster 16yr & up 12/26/2020   Pneumococcal Conjugate-13 07/13/2014   Pneumococcal Polysaccharide-23 04/14/2012   Tdap 07/17/2004, 05/27/2014   Tetanus 05/27/2014   Zoster Recombinat (Shingrix) 02/19/2019, 04/27/2019   CT Lung Screen - not qualified. Never smoker  Orders Placed This Encounter  Procedures   CT Chest Wo Contrast    Please remind patient to schedule Pulmonary office appointment with Dr. ELoanne Drillingafter CT is scheduled.    Standing Status:   Future    Standing Expiration Date:   12/10/2022    Scheduling Instructions:     Please remind patient to schedule Pulmonary office appointment with Dr. ELoanne Drillingafter CT is scheduled.    Order Specific Question:   Is patient pregnant?    Answer:   No    Order Specific Question:   Preferred imaging location?    Answer:   [id number]   Meds ordered this encounter  Medications   fluticasone-salmeterol (ADVAIR DISKUS) 100-50 MCG/ACT AEPB    Sig: Inhale 1 puff into the lungs 2 (two) times daily.    Dispense:  60 each    Refill:  5   montelukast (SINGULAIR) 10 MG tablet    Sig: Take 1 tablet (10 mg total) by mouth at bedtime.    Dispense:  30 tablet    Refill:  11   albuterol (VENTOLIN HFA) 108 (90 Base) MCG/ACT inhaler    Sig: Inhale 2 puffs into the lungs every 6 (six) hours as needed for wheezing or shortness of breath.    Dispense:  8 g    Refill:  2    Return in about 8 weeks (around 01/31/2022).  I have spent a total time of 45-minutes on the day of the appointment reviewing prior documentation, coordinating care and discussing medical diagnosis and plan with the  patient/family. Imaging, labs and tests included in this note have been reviewed and interpreted  independently by me.  [place], MD [facility] 12/06/2021 10:35 AM  Office Number [phone]

## 2021-12-06 NOTE — Patient Instructions (Signed)
Mild persistent asthma, not in exacerbation --START Advair Diskus 100-50 mcg ONE puff in the morning and evening. Rinse out mouth after use --CONTINUE Albuterol AS NEEDED for shortness of breath, wheezing, chest tightness --START montelukast 10 mg daily  Right lung nodule Groundglass opacities in RML --ORDER CT Chest without contrast in 7 weeks  OSA --DME: [facility] --Continue CPAP daily --REQUEST Sleep test results from [facility]01/17 from [facility] or [facility] if they have it available  Follow-up with me in 8 weeks (November)

## 2022-01-13 ENCOUNTER — Emergency Department (HOSPITAL_COMMUNITY)
Admission: EM | Admit: 2022-01-13 | Discharge: 2022-01-14 | Disposition: A | Discharge status: Home / Self Care | Payer: Medicare HMO | Attending: Emergency Medicine | Admitting: Emergency Medicine

## 2022-01-13 ENCOUNTER — Other Ambulatory Visit: Payer: Self-pay

## 2022-01-13 ENCOUNTER — Encounter (HOSPITAL_COMMUNITY): Payer: Self-pay | Admitting: Emergency Medicine

## 2022-01-13 DIAGNOSIS — N281 Cyst of kidney, acquired: Secondary | ICD-10-CM | POA: Insufficient documentation

## 2022-01-13 DIAGNOSIS — E119 Type 2 diabetes mellitus without complications: Secondary | ICD-10-CM | POA: Insufficient documentation

## 2022-01-13 DIAGNOSIS — Z91040 Latex allergy status: Secondary | ICD-10-CM | POA: Diagnosis not present

## 2022-01-13 DIAGNOSIS — Z7982 Long term (current) use of aspirin: Secondary | ICD-10-CM | POA: Insufficient documentation

## 2022-01-13 DIAGNOSIS — J45909 Unspecified asthma, uncomplicated: Secondary | ICD-10-CM | POA: Diagnosis not present

## 2022-01-13 DIAGNOSIS — K5900 Constipation, unspecified: Secondary | ICD-10-CM | POA: Insufficient documentation

## 2022-01-13 LAB — LIPASE, BLOOD: Lipase: 44 U/L (ref 11–51)

## 2022-01-13 LAB — URINALYSIS, ROUTINE W REFLEX MICROSCOPIC
Bilirubin Urine: NEGATIVE
Glucose, UA: NEGATIVE mg/dL
Hgb urine dipstick: NEGATIVE
Ketones, ur: NEGATIVE mg/dL
Nitrite: NEGATIVE
Protein, ur: NEGATIVE mg/dL
Specific Gravity, Urine: 1.005 (ref 1.005–1.030)
pH: 6 (ref 5.0–8.0)

## 2022-01-13 LAB — CBC WITH DIFFERENTIAL/PLATELET
Abs Immature Granulocytes: 0.02 10*3/uL (ref 0.00–0.07)
Basophils Absolute: 0.1 10*3/uL (ref 0.0–0.1)
Basophils Relative: 1 %
Eosinophils Absolute: 0.3 10*3/uL (ref 0.0–0.5)
Eosinophils Relative: 3 %
HCT: 38.2 % (ref 36.0–46.0)
Hemoglobin: 12.7 g/dL (ref 12.0–15.0)
Immature Granulocytes: 0 %
Lymphocytes Relative: 44 %
Lymphs Abs: 3.4 10*3/uL (ref 0.7–4.0)
MCH: 27.7 pg (ref 26.0–34.0)
MCHC: 33.2 g/dL (ref 30.0–36.0)
MCV: 83.4 fL (ref 80.0–100.0)
Monocytes Absolute: 0.9 10*3/uL (ref 0.1–1.0)
Monocytes Relative: 11 %
Neutro Abs: 3.2 10*3/uL (ref 1.7–7.7)
Neutrophils Relative %: 41 %
Platelets: 282 10*3/uL (ref 150–400)
RBC: 4.58 MIL/uL (ref 3.87–5.11)
RDW: 14 % (ref 11.5–15.5)
WBC: 7.8 10*3/uL (ref 4.0–10.5)
nRBC: 0 % (ref 0.0–0.2)

## 2022-01-13 LAB — COMPREHENSIVE METABOLIC PANEL
ALT: 23 U/L (ref 0–44)
AST: 26 U/L (ref 15–41)
Albumin: 4.1 g/dL (ref 3.5–5.0)
Alkaline Phosphatase: 67 U/L (ref 38–126)
Anion gap: 9 (ref 5–15)
BUN: 8 mg/dL (ref 6–20)
CO2: 26 mmol/L (ref 22–32)
Calcium: 9.6 mg/dL (ref 8.9–10.3)
Chloride: 107 mmol/L (ref 98–111)
Creatinine, Ser: 1.35 mg/dL — ABNORMAL HIGH (ref 0.44–1.00)
GFR, Estimated: 46 mL/min — ABNORMAL LOW (ref >60)
Glucose, Bld: 87 mg/dL (ref 70–99)
Potassium: 3.6 mmol/L (ref 3.5–5.1)
Sodium: 142 mmol/L (ref 135–145)
Total Bilirubin: 0.7 mg/dL (ref 0.3–1.2)
Total Protein: 7.6 g/dL (ref 6.5–8.1)

## 2022-01-13 NOTE — ED Provider Triage Note (Signed)
Emergency Medicine Provider Triage Evaluation Note  Abigail Mendez , a 57 y.o. female  was evaluated in triage.  Pt complains of the patient for the past 20 days, although she has had some small bowel movements throughout.  Does endorse taking MiraLAX, Dulcolax, senna, stool softeners without any improvement in symptoms.  Endorsing some nausea, last meal was this morning consisting of squash, onions, zucchini.  No prior episodes like these, does report having 2 hernias in her abdomen.  No prior surgical history of her abdomen.  Review of Systems  Positive: Abdominal pain, nausea, constipation Negative: Melena, vomiting  Physical Exam  LMP 03/25/2014  Gen:   Awake, no distress   Resp:  Normal effort  MSK:   Moves extremities without difficulty  Other:  No focal point of tenderness, abdomen is tender to palpation throughout, some guarding noted.  Medical Decision Making  Medically screening exam initiated at 8:16 PM.  Appropriate orders placed.  TEIARA BARIA was informed that the remainder of the evaluation will be completed by another provider, this initial triage assessment does not replace that evaluation, and the importance of remaining in the ED until their evaluation is complete.  No prior episodes similar to these.    Janeece Fitting, PA-C 01/13/22 2019

## 2022-01-13 NOTE — ED Triage Notes (Signed)
Pt reported to ED with c/o constipation x20 days. States she has not had a complete bowel movement but normally has movements 3 times a day d/t IBS. Reports hx of hernias.

## 2022-01-14 ENCOUNTER — Emergency Department (HOSPITAL_COMMUNITY): Payer: Medicare HMO

## 2022-01-14 LAB — TROPONIN I (HIGH SENSITIVITY): Troponin I (High Sensitivity): 4 ng/L (ref <18)

## 2022-01-14 MED ORDER — GLYCERIN (LAXATIVE) 2 G RE SUPP
1.0000 | Freq: Once | RECTAL | Status: AC
  Administered 2022-01-14: 1 via RECTAL
  Filled 2022-01-14: qty 1

## 2022-01-14 MED ORDER — ONDANSETRON HCL 4 MG/2ML IJ SOLN
4.0000 mg | Freq: Once | INTRAMUSCULAR | Status: AC
  Administered 2022-01-14: 4 mg via INTRAVENOUS
  Filled 2022-01-14: qty 2

## 2022-01-14 MED ORDER — SODIUM CHLORIDE 0.9 % IV BOLUS
500.0000 mL | Freq: Once | INTRAVENOUS | Status: AC
  Administered 2022-01-14: 500 mL via INTRAVENOUS

## 2022-01-14 MED ORDER — IOHEXOL 350 MG/ML SOLN
75.0000 mL | Freq: Once | INTRAVENOUS | Status: AC | PRN
  Administered 2022-01-14: 75 mL via INTRAVENOUS

## 2022-01-14 NOTE — Discharge Instructions (Signed)
Bowel regimen at home as discussed.  May use MiraLAX or similar regimen to your recent colonoscopy.  Follow-up with your GI doctor regarding inflammation of your colon and constipation today.  Follow-up with your doctor regarding your renal cyst.

## 2022-01-14 NOTE — ED Provider Notes (Signed)
[facility] EMERGENCY DEPARTMENT Provider Note   CSN: [id number] Arrival date & time: 01/13/22  1904     History  Chief Complaint  Patient presents with   Constipation    Abigail Mendez is a 56 y.o. female.  56 year old female with past medical history of IBS-D managed with fiber intake presents with complaint of constipation, abdominal distention and abdominal discomfort.  States that she has not had any significant stool output for the past 21 days, normally has 3 bowel movements daily.  Did have a very small formed stool yesterday, reports passing gas today.  She is nauseous, no vomiting.  Denies fevers or chills.  Reports 3 pound weight gain in the past few days. Also reports chest pain described as a heaviness in the center of her chest that has been constant since 8 PM last night, onset while at rest, not worse with exertion, not associated with shortness of breath.  No similar pain previously.  Pain is reproduced with palpation of her chest.  Patient has tried multiple over-the-counter medications without relief of her constipation.  Has been in contact with her bariatric surgeon, has not had surgery, has known to ventral hernias.       Home Medications Prior to Admission medications   Medication Sig Start Date End Date Taking? Authorizing Provider  albuterol (VENTOLIN HFA) 108 (90 Base) MCG/ACT inhaler Inhale 2 puffs into the lungs every 6 (six) hours as needed for wheezing or shortness of breath. 12/06/21   Margaretha Seeds, MD  aspirin 81 MG tablet Take 81 mg by mouth daily.    [provider]  atorvastatin (LIPITOR) 10 MG tablet Take 10 mg by mouth daily.    [provider]  b complex vitamins tablet Take 1 tablet by mouth daily.    [provider]  Biotin 1000 MCG tablet Take 1,000 mcg by mouth 3 (three) times daily.    [provider]  carvedilol (COREG) 6.25 MG tablet Take 6.25 mg by mouth 2 (two) times daily with  a meal.    [provider]  Cholecalciferol (VITAMIN D3) 1000 UNITS CAPS Take 2 tablets by mouth daily. 06/18/11   [provider]  dicyclomine (BENTYL) 10 MG capsule SMARTSIG:1 Capsule(s) By Mouth Patient not taking: Reported on 12/06/2021 11/21/21   [provider]  fluticasone-salmeterol (ADVAIR DISKUS) 100-50 MCG/ACT AEPB Inhale 1 puff into the lungs 2 (two) times daily. 12/06/21   Margaretha Seeds, MD  furosemide (LASIX) 20 MG tablet Take 20 mg by mouth 2 (two) times daily. 12/01/21   [provider]  Ginkgo Biloba 40 MG TABS Take 60 mg by mouth.    [provider]  glucose blood test strip 1 each by Other route as needed for other. Use as instructed    [provider]  hydrOXYzine (VISTARIL) 25 MG/ML injection 75 mg. At bedtime 06/17/17   [provider]  leflunomide (ARAVA) 20 MG tablet Take 20 mg by mouth daily.    [provider]  levothyroxine (SYNTHROID, LEVOTHROID) 50 MCG tablet Take 50 mcg by mouth daily. 06/23/12   [provider]  losartan (COZAAR) 100 MG tablet Take 100 mg by mouth daily.    [provider]  Magnesium (MAGNACAPS PO) Take by mouth.    [provider]  montelukast (SINGULAIR) 10 MG tablet Take 1 tablet (10 mg total) by mouth at bedtime. 12/06/21   Margaretha Seeds, MD  Multiple Vitamin (MULTIVITAMIN WITH MINERALS) TABS  tablet Take 1 tablet by mouth daily.    [provider]  MYRBETRIQ 25 MG TB24 tablet Take 25 mg by mouth daily. 11/22/21   [provider]  ORENCIA CLICKJECT 914 MG/ML SOAJ Inject into the skin. 07/04/21   [provider]  oxyCODONE-acetaminophen (PERCOCET) 10-325 MG tablet Take 1 tablet by mouth every 4 (four) hours as needed for pain.    [provider]  POTASSIUM CHLORIDE ER PO Take 10 mg by mouth.    [provider]  Pyridoxine HCl (VITAMIN B-6 PO) Take by mouth.    [provider]  RYBELSUS 3 MG TABS Take 1  tablet by mouth daily. Taking '7mg'$  07/06/21   [provider]  Vilazodone HCl (VIIBRYD) 40 MG TABS Take by mouth daily.    [provider]  VITAMIN D PO Take by mouth.    [provider]      Allergies    Benztropine mesylate, Lactose, Tizanidine hcl, Amlodipine, Benztropine, Cyclobenzaprine, Gabapentin, Lactose intolerance (gi), Lamotrigine, Latex, Lisinopril, Pregabalin, Topamax [topiramate], Tramadol, Trazodone hcl, Phenylalanine, and Sarilumab    Review of Systems   Review of Systems Negative except as per HPI Physical Exam Updated Vital Signs BP (!) 128/102   Pulse 85   Temp 98.2 F (36.8 C) (Oral)   Resp (!) 25   LMP 03/25/2014   SpO2 100%  Physical Exam Vitals and nursing note reviewed.  Constitutional:      General: She is not in acute distress.    Appearance: She is well-developed. She is not diaphoretic.  HENT:     Head: Normocephalic and atraumatic.  Cardiovascular:     Rate and Rhythm: Normal rate and regular rhythm.     Pulses: Normal pulses.     Heart sounds: Normal heart sounds.  Pulmonary:     Effort: Pulmonary effort is normal.     Breath sounds: Normal breath sounds.  Chest:     Chest wall: Tenderness present.    Abdominal:     Palpations: Abdomen is soft.     Tenderness: There is generalized abdominal tenderness. There is no guarding or rebound.  Musculoskeletal:     Right lower leg: No edema.     Left lower leg: No edema.  Skin:    General: Skin is warm and dry.  Neurological:     Mental Status: She is alert and oriented to person, place, and time.  Psychiatric:        Behavior: Behavior normal.     ED Results / Procedures / Treatments   Labs (all labs ordered are listed, but only abnormal results are displayed) Labs Reviewed  COMPREHENSIVE METABOLIC PANEL - Abnormal; Notable for the following components:      Result Value   Creatinine, Ser 1.35 (*)    GFR, Estimated 46 (*)    All other components within normal  limits  URINALYSIS, ROUTINE W REFLEX MICROSCOPIC - Abnormal; Notable for the following components:   Color, Urine STRAW (*)    Leukocytes,Ua TRACE (*)    Bacteria, UA RARE (*)    All other components within normal limits  CBC WITH DIFFERENTIAL/PLATELET  LIPASE, BLOOD  TROPONIN I (HIGH SENSITIVITY)    EKG EKG Interpretation  Date/Time:  Sunday January 14 2022 12:25:56 EDT Ventricular Rate:  70 PR Interval:  158 QRS Duration: 100 QT Interval:  407 QTC Calculation: 440 R Axis:   41 Text Interpretation: Sinus rhythm No acute changes No significant change since last tracing No old  tracing to compare Confirmed by Varney Biles [phone]) on 01/14/2022 3:14:51 PM  Radiology CT Abdomen Pelvis W Contrast  Result Date: 01/14/2022 CLINICAL DATA:  Abdominal pain, reported history of renal cancer * Tracking Code: BO * EXAM: CT ABDOMEN AND PELVIS WITH CONTRAST TECHNIQUE: Multidetector CT imaging of the abdomen and pelvis was performed using the standard protocol following bolus administration of intravenous contrast. RADIATION DOSE REDUCTION: This exam was performed according to the departmental dose-optimization program which includes automated exposure control, adjustment of the mA and/or kV according to patient size and/or use of iterative reconstruction technique. CONTRAST:  47m OMNIPAQUE IOHEXOL 350 MG/ML SOLN COMPARISON:  None Available. FINDINGS: Lower chest: No acute abnormality. Hepatobiliary: No solid liver abnormality is seen. No gallstones, gallbladder wall thickening, or biliary dilatation. Pancreas: Unremarkable. No pancreatic ductal dilatation or surrounding inflammatory changes. Spleen: Normal in size without significant abnormality. Adrenals/Urinary Tract: Adrenal glands are unremarkable. Complex appearing cystic lesion of the peripheral inferior pole of the left kidney measuring 1.1 x 0.9 cm (series 3, image 42). No calculi or hydronephrosis. Bladder is unremarkable. Stomach/Bowel:  Stomach is within normal limits. Appendix appears normal. Circumferential wall thickening of the distal sigmoid colon and rectum (series 8, image 107). Vascular/Lymphatic: Aortic atherosclerosis. No enlarged abdominal or pelvic lymph nodes. Reproductive: No mass or other significant abnormality. Other: Small, fat containing midline epigastric hernia (series 3, image 48). Small, fat containing umbilical hernia (series 3, image 54). No ascites. Musculoskeletal: No acute or significant osseous findings. IMPRESSION: 1. Circumferential wall thickening of the distal sigmoid colon and rectum, consistent with nonspecific infectious or inflammatory proctocolitis. 2. Complex appearing cystic lesion of the peripheral inferior pole of the left kidney measuring 1.1 x 0.9 cm, although incompletely characterized on this examination, suspicious for a Bosniak category III or IV lesion and generally consistent with reported history of renal cell carcinoma. This could be further characterized on a nonemergent, outpatient basis by multiphasic contrast enhanced CT or MRI if desired. 3. No evidence of lymphadenopathy or metastatic disease in the abdomen or pelvis. 4. Small, fat containing midline epigastric and umbilical hernias. Aortic Atherosclerosis (ICD10-I70.0). Electronically Signed   By: ADelanna AhmadiM.D.   On: 01/14/2022 14:27   DG Chest 2 View  Result Date: 01/14/2022 CLINICAL DATA:  Chest pain today. EXAM: CHEST - 2 VIEW COMPARISON:  11/24/2014. FINDINGS: Cardiac silhouette normal in size. Left anterior chest wall loop recorder. Normal mediastinal and hilar contours. Clear lungs.  No pleural effusion or pneumothorax. Skeletal structures are intact. IMPRESSION: No active cardiopulmonary disease. Electronically Signed   By: DLajean ManesM.D.   On: 01/14/2022 13:28    Procedures Procedures    Medications Ordered in ED Medications  sodium chloride 0.9 % bolus 500 mL (0 mLs Intravenous Stopped 01/14/22 1405)   Glycerin (Adult) 2 g suppository 1 suppository (1 suppository Rectal Given 01/14/22 1234)  ondansetron (ZOFRAN) injection 4 mg (4 mg Intravenous Given 01/14/22 1236)  iohexol (OMNIPAQUE) 350 MG/ML injection 75 mL (75 mLs Intravenous Contrast Given 01/14/22 1356)    ED Course/ Medical Decision Making/ A&P                           Medical Decision Making Amount and/or Complexity of Data Reviewed Radiology: ordered.  Risk OTC drugs. Prescription drug management.   This patient presents to the ED for concern of constipation and chest pain, this involves an extensive number of treatment options, and is a complaint that carries with  it a high risk of complications and morbidity.  The differential diagnosis includes but not limited to bowel obstruction, fecal impaction, constipation, arrhythmia, ACS   Co morbidities that complicate the patient evaluation  GERD, anemia, renal mass, diabetes, depression, pulmonary nodule, asthma, IBS-D   Additional history obtained:  External records from outside source obtained and reviewed including recent visit to nephrology dated 01/08/2022 for stage III chronic kidney disease   Lab Tests:  I Ordered, and personally interpreted labs.  The pertinent results include: Troponin is 4.  Lipase within normal notes.  CMP with creatinine 1.35, not significantly changed compared to prior on file.  CBC is unremarkable, urinalysis positive for trace leukocytes with rare bacteria.   Imaging Studies ordered:  I ordered imaging studies including CT abdomen pelvis with contrast I independently visualized and interpreted imaging which showed no acute abnormality, known renal cyst, discussed with patient. I agree with the radiologist interpretation   Cardiac Monitoring: / EKG:  The patient was maintained on a cardiac monitor.  I personally viewed and interpreted the cardiac monitored which showed an underlying rhythm of: Sinus rhythm, rate  70   Consultations Obtained:  I requested consultation with the ER attending, Dr. Kathrynn Humble,  and discussed lab and imaging findings as well as pertinent plan - they recommend: Negative EKG/no ischemic changes.  Pain since 8 PM with initial troponin of 4, do not need to hold for repeat troponin.   Problem List / ED Course / Critical interventions / Medication management  56 year old female presents with complaint of constipation with minimal stool output x21 days despite OTC treatment.  Also reports chest pain since last night.  In regards to her chest pain, her EKG does not show any ischemic changes, her troponin is negative.  Can follow-up with PCP.  Regarding her constipation, her lab work is unremarkable/stable.  Her CT scan does not show any acute findings, specifically no significant stool burden.  She does have some inflammation in the lower colon, does not participate in anal intercourse, no rectal pain, white count normal.  Doubt infectious cause of her inflammation.  She is up-to-date on her colonoscopy, is encouraged to follow-up with her GI regarding this finding as well as for further work-up if her constipation remains unrelieved despite further management with colon prep/MiraLAX. I ordered medication including Zofran, glycerin suppository for nausea, constipation Reevaluation of the patient after these medicines showed that the patient stayed the same I have reviewed the patients home medicines and have made adjustments as needed   Social Determinants of Health:  Has PCP as well as specialty care team for follow-up.   Test / Admission - Considered:  After thorough evaluation in the emergency room today, felt stable for discharge to follow-up with care team as discussed.         Final Clinical Impression(s) / ED Diagnoses Final diagnoses:  Constipation, unspecified constipation type  Renal cyst, left    Rx / DC Orders ED Discharge Orders     None          Tacy Learn, PA-C 01/14/22 [place], Ankit, MD 01/23/22 2038

## 2022-01-16 ENCOUNTER — Encounter: Payer: Self-pay | Admitting: Pulmonary Disease

## 2022-01-18 ENCOUNTER — Ambulatory Visit
Admission: RE | Admit: 2022-01-18 | Discharge: 2022-01-18 | Disposition: A | Discharge status: Home / Self Care | Payer: Medicare HMO | Source: Ambulatory Visit | Attending: Pulmonary Disease | Admitting: Pulmonary Disease

## 2022-01-18 DIAGNOSIS — R918 Other nonspecific abnormal finding of lung field: Secondary | ICD-10-CM

## 2022-02-01 ENCOUNTER — Ambulatory Visit (INDEPENDENT_AMBULATORY_CARE_PROVIDER_SITE_OTHER): Payer: Medicare HMO | Admitting: Pulmonary Disease

## 2022-02-01 ENCOUNTER — Encounter (HOSPITAL_BASED_OUTPATIENT_CLINIC_OR_DEPARTMENT_OTHER): Payer: Self-pay | Admitting: Pulmonary Disease

## 2022-02-01 VITALS — BP 120/86 | HR 77 | Ht 65.0 in | Wt 239.0 lb

## 2022-02-01 DIAGNOSIS — J4530 Mild persistent asthma, uncomplicated: Secondary | ICD-10-CM | POA: Diagnosis not present

## 2022-02-01 DIAGNOSIS — R911 Solitary pulmonary nodule: Secondary | ICD-10-CM | POA: Diagnosis not present

## 2022-02-01 NOTE — Progress Notes (Signed)
Subjective:   PATIENT ID: Abigail Mendez GENDER: female DOB: 09-03-1965, MRN: [id number]   HPI  Chief Complaint  Patient presents with   Follow-up    CT results Bowl issues     Reason for Visit: New consult for pulmonary nodules, asthma  Ms. Abigail Mendez is a 56 year old female never smoker with adult-onset asthma, CKD IIIa, RA, DM2, HTN, chronic anemia who presents for establish pulmonary care.  Initial consult She was last seen by a Pulmonologist in 2014 for asthma, sleep apnea and reported hx of pulmonary nodules. Reviewed CT reports in [facility] as noted below.  She reports shortness of breath with chest tightness. Not currently having wheezing but will have it when symptoms are severe. Associated with nasal congestion at night. Shortness of breath worsens at night. Occasional cough. No known triggers for this current episode. Previously triggered by animal dander, cigarette smoking, humidity, cold air and illness. She is currently on albuterol and rarely uses it. Not currently on maintenance inhalers. Previously on Advair.   02/01/22 Since our last visit she reports she is doing fair with symptoms similar with shortness of breath and chest tightness. Using rescue inhaler as needed but this is rare. Reports trial of Advair for one month however associated with ill feelings including malaise, nausea, intermittent hunger. Once she self-discontinued symptoms self-resolved.       No data to display          Social History: Never smoker  Past Medical History:  Diagnosis Date   Anemia    Anxiety    Asthma    Chronic pain disorder    Daytime somnolence 01/07/2013   Depression    Diabetes mellitus, type II ([facility])    Fibromyalgia    GERD (gastroesophageal reflux disease)    Hypertension    Hypothyroidism    MDD (major depressive disorder) 01/07/2013   Obesity    OSA on CPAP 01/07/2013   PTSD (post-traumatic stress disorder)    Seasonal allergies    Sickle  cell trait (HCC)    Sleep apnea    Vaginitis      Family History  Problem Relation Age of Onset   Breast cancer Sister 33   Bipolar disorder Sister    Suicidality Neg Hx      Social History   Occupational History   Not on file  Tobacco Use   Smoking status: Never   Smokeless tobacco: Never  Substance and Sexual Activity   Alcohol use: No   Drug use: No   Sexual activity: Never    Birth control/protection: Abstinence    Allergies  Allergen Reactions   Benztropine Mesylate     Other reaction(s): Unknown   Lactose     Other reaction(s): Unknown   Tizanidine Hcl Other (See Comments)    Other reaction(s): Unknown   Amlodipine Swelling    Other reaction(s): Unknown   Benztropine Nausea And Vomiting   Cyclobenzaprine     Hallucinations   Gabapentin     Other reaction(s): Unknown   Lactose Intolerance (Gi) Diarrhea   Lamotrigine     'fall', balance problems   Latex     Redness,swelling,itching, localized pain   Lisinopril     cough   Pregabalin Other (See Comments)   Topamax [Topiramate] Other (See Comments)    Burning in hands and feet   Tramadol Nausea And Vomiting   Trazodone Hcl Other (See Comments)    Other reaction(s): Unknown   Phenylalanine Rash  Sarilumab Itching     Outpatient Medications Prior to Visit  Medication Sig Dispense Refill   albuterol (VENTOLIN HFA) 108 (90 Base) MCG/ACT inhaler Inhale 2 puffs into the lungs every 6 (six) hours as needed for wheezing or shortness of breath. 8 g 2   aspirin 81 MG tablet Take 81 mg by mouth daily.     atorvastatin (LIPITOR) 10 MG tablet Take 10 mg by mouth daily.     b complex vitamins tablet Take 1 tablet by mouth daily.     Biotin 1000 MCG tablet Take 1,000 mcg by mouth 3 (three) times daily.     carvedilol (COREG) 6.25 MG tablet Take 6.25 mg by mouth 2 (two) times daily with a meal.     Cholecalciferol (VITAMIN D3) 1000 UNITS CAPS Take 2 tablets by mouth daily.     dicyclomine (BENTYL) 10 MG capsule       fluticasone-salmeterol (ADVAIR DISKUS) 100-50 MCG/ACT AEPB Inhale 1 puff into the lungs 2 (two) times daily. 60 each 5   furosemide (LASIX) 20 MG tablet Take 20 mg by mouth 2 (two) times daily.     Ginkgo Biloba 40 MG TABS Take 60 mg by mouth.     glucose blood test strip 1 each by Other route as needed for other. Use as instructed     hydrOXYzine (VISTARIL) 25 MG/ML injection 75 mg. At bedtime     leflunomide (ARAVA) 20 MG tablet Take 20 mg by mouth daily.     levothyroxine (SYNTHROID, LEVOTHROID) 50 MCG tablet Take 50 mcg by mouth daily.     losartan (COZAAR) 100 MG tablet Take 100 mg by mouth daily.     Magnesium (MAGNACAPS PO) Take by mouth.     montelukast (SINGULAIR) 10 MG tablet Take 1 tablet (10 mg total) by mouth at bedtime. 30 tablet 11   Multiple Vitamin (MULTIVITAMIN WITH MINERALS) TABS tablet Take 1 tablet by mouth daily.     MYRBETRIQ 25 MG TB24 tablet Take 25 mg by mouth daily.     ORENCIA CLICKJECT 956 MG/ML SOAJ Inject into the skin.     oxyCODONE-acetaminophen (PERCOCET) 10-325 MG tablet Take 1 tablet by mouth every 4 (four) hours as needed for pain.     POTASSIUM CHLORIDE ER PO Take 10 mg by mouth.     Pyridoxine HCl (VITAMIN B-6 PO) Take by mouth.     RYBELSUS 3 MG TABS Take 1 tablet by mouth daily. Taking '7mg'$      Vilazodone HCl (VIIBRYD) 40 MG TABS Take by mouth daily.     VITAMIN D PO Take by mouth.     No facility-administered medications prior to visit.    Review of Systems  Constitutional:  Negative for chills, diaphoresis, fever, malaise/fatigue and weight loss.  HENT:  Negative for congestion.   Respiratory:  Positive for cough, shortness of breath and wheezing. Negative for hemoptysis and sputum production.   Cardiovascular:  Negative for chest pain (chest tightness), palpitations and leg swelling.       Chest tightness      Objective:   Vitals:   02/01/22 1449  BP: 120/86  Pulse: 77  SpO2: 100%  Weight: 239 lb (108.4 kg)  Height: '5\' 5"'$  (1.651  m)  Body mass index is 39.77 kg/m.  SpO2: 100 %  Physical Exam: General: Well-appearing, no acute distress HENT: [place], AT Eyes: EOMI, no scleral icterus Respiratory: Clear to auscultation bilaterally.  No crackles, wheezing or rales Cardiovascular: RRR, -M/R/G, no JVD Extremities:-Edema,-tenderness  Neuro: AAO x4, CNII-XII grossly intact Psych: Normal mood, normal affect  Data Reviewed:  Imaging: CT Chest 08/11/12  [facility] (report only)- No adenopathy. "No change in 1.1 cm nodule or scar at the right lung base in image 86. Overall area of groundglass opacity in the lingular around image 64 is increased. There are 2 new areas of groundglass opacity in the right middle lobe"  CT Chest 01/18/22 RLL lung nodules measuring 1.4 x 1 cm, unchanged compared to 01/14/22. Interval enlargement compared to 2014.  PFT: None of file  Labs: CBC    Component Value Date/Time   WBC 7.8 01/13/2022 2023   RBC 4.58 01/13/2022 2023   HGB 12.7 01/13/2022 2023   HCT 38.2 01/13/2022 2023   PLT 282 01/13/2022 2023   MCV 83.4 01/13/2022 2023   MCH 27.7 01/13/2022 2023   MCHC 33.2 01/13/2022 2023   RDW 14.0 01/13/2022 2023   LYMPHSABS 3.4 01/13/2022 2023   MONOABS 0.9 01/13/2022 2023   EOSABS 0.3 01/13/2022 2023   BASOSABS 0.1 01/13/2022 2023  Chronic anemia  Sleep: PSG 10/01/21 - Unable to view records     Assessment & Plan:   Discussion: 56 year old female never smoker with adult onset asthma, CKD IIIa, RA, DM2, HTN, chronic anemia who presents for follow-up. Did not tolerate low-dose ICS/LABA. Will continue management with SABA until PFTs completed. Reports fair control for now. Reviewed CT imaging as noted below.   Mild persistent asthma, not in exacerbation --STOP Advair Diskus due to side effects (ill-feeling, nausea, hunger etc) --ORDER pulmonary function tests --CONTINUE Albuterol AS NEEDED for shortness of breath, wheezing, chest tightness --CONTINUE montelukast 10 mg daily  Right  lower lobe lung nodules --CT chest reviewed. Enlarged RLL lung nodule from 1.1>1.4cm over 2014-2023. Unclear significance however will monitor for potential ongoing growth --ORDER CT Chest without contrast in 3 months. If stable, will increase interval to q6 months  OSA --Continue CPAP daily --REQUEST Sleep test results from 10/01/17 from [facility] Maintenance Immunization History  Administered Date(s) Administered   Influenza Inj Mdck Quad Pf 01/03/2021   Influenza Split 01/08/2012, 12/25/2012, 12/24/2015, 01/02/2021   Influenza, Quadrivalent, Recombinant, Inj, Pf 12/18/2019   Influenza,inj,Quad PF,6+ Mos 12/18/2019   Influenza,inj,Quad PF,6-35 Mos 01/06/2019   Influenza,trivalent, recombinat, inj, PF 12/10/2014, 01/01/2017   Influenza-Unspecified 01/10/2011, 12/24/2015, 01/03/2017, 12/31/2017   PFIZER Comirnaty(Gray Top)Covid-19 Tri-Sucrose Vaccine 10/20/2020   PFIZER(Purple Top)SARS-COV-2 Vaccination 06/18/2019, 07/09/2019, 11/19/2019, 11/21/2019   Pfizer Covid-19 Vaccine Bivalent Booster 16yr & up 12/26/2020   Pneumococcal Conjugate-13 07/13/2014   Pneumococcal Polysaccharide-23 04/14/2012   Tdap 07/17/2004, 05/27/2014   Tetanus 05/27/2014   Zoster Recombinat (Shingrix) 02/19/2019, 04/27/2019   CT Lung Screen - not qualified. Never smoker  Orders Placed This Encounter  Procedures   CT Chest Wo Contrast    Schedule in 3 months    Standing Status:   Future    Standing Expiration Date:   02/02/2023    Order Specific Question:   Is patient pregnant?    Answer:   No    Order Specific Question:   Preferred imaging location?    Answer:   [facility]   Pulmonary function test    Standing Status:   Future    Standing Expiration Date:   02/02/2023    Scheduling Instructions:     [facility]    Order Specific Question:   Where should this test be performed?    Answer:   Other    Order Specific Question:  Full PFT: includes the following: basic spirometry,  spirometry pre & post bronchodilator, diffusion capacity (DLCO), lung volumes    Answer:   Full PFT    Order Specific Question:   Release to patient    Answer:   Immediate   No orders of the defined types were placed in this encounter.   Return in 3 months (on 05/04/2022) for After PFTs.  I have spent a total time of 31-minutes on the day of the appointment including chart review, data review, collecting history, coordinating care and discussing medical diagnosis and plan with the patient/family. Past medical history, allergies, medications were reviewed. Pertinent imaging, labs and tests included in this note have been reviewed and interpreted independently by me.  [place], MD [facility] 02/01/2022 8:21 PM  Office Number [phone]

## 2022-02-01 NOTE — Patient Instructions (Addendum)
Mild persistent asthma, not in exacerbation --STOP Advair Diskus due to side effects (ill-feeling, nausea, hunger etc) --ORDER pulmonary function tests --CONTINUE Albuterol AS NEEDED for shortness of breath, wheezing, chest tightness --CONTINUE montelukast 10 mg daily  Right lower lobe lung nodules --CT chest reviewed. Enlarged RLL lung nodule from 1.1>1.4cm over 2014-2023. Unclear significance however will monitor for potential ongoing growth --ORDER CT Chest without contrast in 3 months. If stable, will increase interval to q6 months  Follow-up with me with PFTs prior to appointment when next available

## 2022-02-06 ENCOUNTER — Encounter (HOSPITAL_BASED_OUTPATIENT_CLINIC_OR_DEPARTMENT_OTHER): Payer: Self-pay | Admitting: Pulmonary Disease

## 2022-02-07 ENCOUNTER — Encounter: Payer: Self-pay | Admitting: Adult Health

## 2022-02-12 ENCOUNTER — Encounter (HOSPITAL_BASED_OUTPATIENT_CLINIC_OR_DEPARTMENT_OTHER): Payer: Self-pay | Admitting: Pulmonary Disease

## 2022-02-12 NOTE — Telephone Encounter (Signed)
Hasn't been seen since 04/2021. Lets get her back in for an appointment to discuss.

## 2022-02-20 NOTE — Telephone Encounter (Signed)
Dr. Loanne Drilling, please advise on pt's messages regarding her CT scan. Thanks.

## 2022-02-28 ENCOUNTER — Other Ambulatory Visit: Payer: Self-pay | Admitting: Pulmonary Disease

## 2022-03-05 ENCOUNTER — Encounter: Payer: Self-pay | Admitting: *Deleted

## 2022-03-05 NOTE — Progress Notes (Signed)
PATIENT: Abigail Mendez DOB: 05-Oct-1965  REASON FOR VISIT: follow up HISTORY FROM: patient PRIMARY NEUROLOGIST:   Virtual Visit via Video Note  I connected with Luther Redo on 03/06/22 at  8:00 AM EST by a video enabled telemedicine application located remotely at [facility] and verified that I am speaking with the correct person using two identifiers who was located at their own home.   I discussed the limitations of evaluation and management by telemedicine and the availability of in person appointments. The patient expressed understanding and agreed to proceed.   PATIENT: Abigail Mendez DOB: Jan 16, 1966  REASON FOR VISIT: follow up HISTORY FROM: patient  HISTORY OF PRESENT ILLNESS: Today 03/06/22:  Ms. Huwe is a 56 year old female with a history of OSA on CPAP. She returns today for follow-up. Reports CPAP gets water in the mask. Has tried adjusting humidity. Download is below.     REVIEW OF SYSTEMS: Out of a complete 14 system review of symptoms, the patient complains only of the following symptoms, and all other reviewed systems are negative.  ALLERGIES: Allergies  Allergen Reactions   Benztropine Mesylate     Other reaction(s): Unknown   Lactose     Other reaction(s): Unknown   Tizanidine Hcl Other (See Comments)    Other reaction(s): Unknown   Amlodipine Swelling    Other reaction(s): Unknown   Benztropine Nausea And Vomiting   Cyclobenzaprine     Hallucinations   Gabapentin     Other reaction(s): Unknown   Lactose Intolerance (Gi) Diarrhea   Lamotrigine     'fall', balance problems   Latex     Redness,swelling,itching, localized pain   Lisinopril     cough   Pregabalin Other (See Comments)   Topamax [Topiramate] Other (See Comments)    Burning in hands and feet   Tramadol Nausea And Vomiting   Trazodone Hcl Other (See Comments)    Other reaction(s): Unknown   Phenylalanine Rash   Sarilumab Itching    HOME  MEDICATIONS: Outpatient Medications Prior to Visit  Medication Sig Dispense Refill   albuterol (VENTOLIN HFA) 108 (90 Base) MCG/ACT inhaler INHALE 2 PUFFS INTO THE LUNGS EVERY 6 HOURS AS NEEDED FOR WHEEZING OR SHORTNESS OF BREATH 6.7 g 2   aspirin 81 MG tablet Take 81 mg by mouth daily.     atorvastatin (LIPITOR) 10 MG tablet Take 10 mg by mouth daily.     b complex vitamins tablet Take 1 tablet by mouth daily.     Biotin 1000 MCG tablet Take 1,000 mcg by mouth 3 (three) times daily.     carvedilol (COREG) 6.25 MG tablet Take 6.25 mg by mouth 2 (two) times daily with a meal.     Cholecalciferol (VITAMIN D3) 1000 UNITS CAPS Take 2 tablets by mouth daily.     dicyclomine (BENTYL) 10 MG capsule      fluticasone-salmeterol (ADVAIR DISKUS) 100-50 MCG/ACT AEPB Inhale 1 puff into the lungs 2 (two) times daily. 60 each 5   furosemide (LASIX) 20 MG tablet Take 20 mg by mouth 2 (two) times daily.     Ginkgo Biloba 40 MG TABS Take 60 mg by mouth.     glucose blood test strip 1 each by Other route as needed for other. Use as instructed     hydrOXYzine (VISTARIL) 25 MG/ML injection 75 mg. At bedtime     leflunomide (ARAVA) 20 MG tablet Take 20 mg by mouth daily.  levothyroxine (SYNTHROID, LEVOTHROID) 50 MCG tablet Take 50 mcg by mouth daily.     losartan (COZAAR) 100 MG tablet Take 100 mg by mouth daily.     Magnesium (MAGNACAPS PO) Take by mouth.     montelukast (SINGULAIR) 10 MG tablet Take 1 tablet (10 mg total) by mouth at bedtime. 30 tablet 11   Multiple Vitamin (MULTIVITAMIN WITH MINERALS) TABS tablet Take 1 tablet by mouth daily.     MYRBETRIQ 25 MG TB24 tablet Take 25 mg by mouth daily.     ORENCIA CLICKJECT 125 MG/ML SOAJ Inject into the skin.     oxyCODONE-acetaminophen (PERCOCET) 10-325 MG tablet Take 1 tablet by mouth every 4 (four) hours as needed for pain.     POTASSIUM CHLORIDE ER PO Take 10 mg by mouth.     Pyridoxine HCl (VITAMIN B-6 PO) Take by mouth.     RYBELSUS 3 MG TABS Take 1  tablet by mouth daily. Taking 7mg      Vilazodone HCl (VIIBRYD) 40 MG TABS Take by mouth daily.     VITAMIN D PO Take by mouth.     No facility-administered medications prior to visit.    PAST MEDICAL HISTORY: Past Medical History:  Diagnosis Date   Anemia    Anxiety    Asthma    Chronic pain disorder    Daytime somnolence 01/07/2013   Depression    Diabetes mellitus, type II (HCC)    Fibromyalgia    GERD (gastroesophageal reflux disease)    Hypertension    Hypothyroidism    MDD (major depressive disorder) 01/07/2013   Obesity    OSA on CPAP 01/07/2013   PTSD (post-traumatic stress disorder)    Seasonal allergies    Sickle cell trait (HCC)    Sleep apnea    Vaginitis     PAST SURGICAL HISTORY: Past Surgical History:  Procedure Laterality Date   NO PAST SURGERIES      FAMILY HISTORY: Family History  Problem Relation Age of Onset   Breast cancer Sister 18   Bipolar disorder Sister    Suicidality Neg Hx     SOCIAL HISTORY: Social History   Socioeconomic History   Marital status: Single    Spouse name: Not on file   Number of children: Not on file   Years of education: Not on file   Highest education level: Not on file  Occupational History   Not on file  Tobacco Use   Smoking status: Never   Smokeless tobacco: Never  Substance and Sexual Activity   Alcohol use: No   Drug use: No   Sexual activity: Never    Birth control/protection: Abstinence  Other Topics Concern   Not on file  Social History Narrative   Not on file   Social Determinants of Health   Financial Resource Strain: Not on file  Food Insecurity: Not on file  Transportation Needs: Not on file  Physical Activity: Not on file  Stress: Not on file  Social Connections: Not on file  Intimate Partner Violence: Not on file      PHYSICAL EXAM Generalized: Well developed, in no acute distress   Neurological examination  Mentation: Alert oriented to time, place, history taking. Follows  all commands speech and language fluent Cranial nerve II-XII:Extraocular movements were full. Facial symmetry noted. uvula tongue midline. Head turning and shoulder shrug  were normal and symmetric.  DIAGNOSTIC DATA (LABS, IMAGING, TESTING) - I reviewed patient records, labs, notes, testing and imaging myself where  available.  Lab Results  Component Value Date   WBC 7.8 01/13/2022   HGB 12.7 01/13/2022   HCT 38.2 01/13/2022   MCV 83.4 01/13/2022   PLT 282 01/13/2022      Component Value Date/Time   NA 142 01/13/2022 2023   K 3.6 01/13/2022 2023   CL 107 01/13/2022 2023   CO2 26 01/13/2022 2023   GLUCOSE 87 01/13/2022 2023   BUN 8 01/13/2022 2023   CREATININE 1.35 (H) 01/13/2022 2023   CALCIUM 9.6 01/13/2022 2023   PROT 7.6 01/13/2022 2023   ALBUMIN 4.1 01/13/2022 2023   AST 26 01/13/2022 2023   ALT 23 01/13/2022 2023   ALKPHOS 67 01/13/2022 2023   BILITOT 0.7 01/13/2022 2023   GFRNONAA 46 (L) 01/13/2022 2023   GFRAA 41 (L) 08/22/2015 1900   Lab Results  Component Value Date   TSH 2.039 02/01/2010      ASSESSMENT AND PLAN 56 y.o. year old female  has a past medical history of Anemia, Anxiety, Asthma, Chronic pain disorder, Daytime somnolence (01/07/2013), Depression, Diabetes mellitus, type II (HCC), Fibromyalgia, GERD (gastroesophageal reflux disease), Hypertension, Hypothyroidism, MDD (major depressive disorder) (01/07/2013), Obesity, OSA on CPAP (01/07/2013), PTSD (post-traumatic stress disorder), Seasonal allergies, Sickle cell trait (HCC), Sleep apnea, and Vaginitis. here with:  OSA on CPAP  CPAP compliance excellent Residual AHI is good Encouraged patient to continue using CPAP nightly and > 4 hours each night Order sent to DME to evaluate machine F/U in 1 year or sooner if needed   Butch Penny, MSN, NP-C 03/06/2022, 9:06 AM [facility] [address], [address] [place], [place] [address] [phone]

## 2022-03-06 ENCOUNTER — Telehealth (INDEPENDENT_AMBULATORY_CARE_PROVIDER_SITE_OTHER): Payer: Medicare HMO | Admitting: Adult Health

## 2022-03-06 DIAGNOSIS — G4733 Obstructive sleep apnea (adult) (pediatric): Secondary | ICD-10-CM | POA: Diagnosis not present

## 2022-03-08 NOTE — Progress Notes (Signed)
Orders faxed to [facility] with fax confirmation received [phone].

## 2022-03-22 ENCOUNTER — Encounter: Payer: Self-pay | Admitting: Podiatry

## 2022-03-22 ENCOUNTER — Ambulatory Visit: Payer: Medicare HMO | Admitting: Podiatry

## 2022-03-22 ENCOUNTER — Encounter: Payer: Self-pay | Admitting: Adult Health

## 2022-03-22 ENCOUNTER — Telehealth: Payer: Self-pay | Admitting: Adult Health

## 2022-03-22 VITALS — BP 167/98 | HR 78

## 2022-03-22 DIAGNOSIS — D2372 Other benign neoplasm of skin of left lower limb, including hip: Secondary | ICD-10-CM | POA: Diagnosis not present

## 2022-03-22 NOTE — Telephone Encounter (Signed)
Please call patient and let her know the DME is [employer]. Their number is [phone].

## 2022-03-22 NOTE — Telephone Encounter (Addendum)
Pt checking on order to DME company to address problem with water coming in the tubing up in pt's nose. Pt do not know the name of DME company. Would like a call from the nurse.

## 2022-03-25 NOTE — Progress Notes (Signed)
She presents today states that the tip of the second toe is tender when she is walking she states that she has neuropathy and the tip of the toe seems to be callused.  Objective: Vital signs are stable she is alert and oriented x 3 mild hammertoe deformities with rigid PIPJ flexor contracture second digit of that foot.  Distal clavus is also noted in this area.  No open lesions or wounds.  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  Plan: Debrided the callus in the toe today and placed padding discussed briefly surgical correction.

## 2022-03-26 HISTORY — PX: RETINAL TEAR REPAIR CRYOTHERAPY: SHX5304

## 2022-03-29 NOTE — Telephone Encounter (Signed)
LVM informing pt of DME company and phone number. Can call back if have any questions.

## 2022-03-29 NOTE — Telephone Encounter (Signed)
Pt has called back to report that Abigail Mendez is not her DME.  Pt has HMO not PPO. Pt has not been able to use CPAP.  Pt said she would like for [name] , NP to call DME in order for her to get an appointment.  Pt states this has been going on since Oct. Pt asking for a call .

## 2022-03-29 NOTE — Telephone Encounter (Signed)
Noted. I sent  the order from last month to [facility]. Will update with their response and also update patient.

## 2022-03-30 ENCOUNTER — Emergency Department (HOSPITAL_COMMUNITY): Payer: Medicare HMO

## 2022-03-30 ENCOUNTER — Encounter (HOSPITAL_COMMUNITY): Payer: Self-pay | Admitting: *Deleted

## 2022-03-30 ENCOUNTER — Emergency Department (HOSPITAL_COMMUNITY)
Admission: EM | Admit: 2022-03-30 | Discharge: 2022-03-31 | Discharge status: Against Medical Advice | Payer: Medicare HMO | Attending: Emergency Medicine | Admitting: Emergency Medicine

## 2022-03-30 ENCOUNTER — Other Ambulatory Visit: Payer: Self-pay

## 2022-03-30 DIAGNOSIS — Z5321 Procedure and treatment not carried out due to patient leaving prior to being seen by health care provider: Secondary | ICD-10-CM | POA: Insufficient documentation

## 2022-03-30 DIAGNOSIS — M7918 Myalgia, other site: Secondary | ICD-10-CM | POA: Diagnosis not present

## 2022-03-30 DIAGNOSIS — R0602 Shortness of breath: Secondary | ICD-10-CM | POA: Diagnosis not present

## 2022-03-30 DIAGNOSIS — R079 Chest pain, unspecified: Secondary | ICD-10-CM | POA: Diagnosis present

## 2022-03-30 DIAGNOSIS — R519 Headache, unspecified: Secondary | ICD-10-CM | POA: Insufficient documentation

## 2022-03-30 LAB — BASIC METABOLIC PANEL
Anion gap: 9 (ref 5–15)
BUN: 8 mg/dL (ref 6–20)
CO2: 29 mmol/L (ref 22–32)
Calcium: 9.7 mg/dL (ref 8.9–10.3)
Chloride: 102 mmol/L (ref 98–111)
Creatinine, Ser: 1.38 mg/dL — ABNORMAL HIGH (ref 0.44–1.00)
GFR, Estimated: 44 mL/min — ABNORMAL LOW (ref >60)
Glucose, Bld: 95 mg/dL (ref 70–99)
Potassium: 4.4 mmol/L (ref 3.5–5.1)
Sodium: 140 mmol/L (ref 135–145)

## 2022-03-30 LAB — I-STAT BETA HCG BLOOD, ED (MC, WL, AP ONLY): I-stat hCG, quantitative: 5.7 m[IU]/mL — ABNORMAL HIGH (ref <5)

## 2022-03-30 LAB — CBC
HCT: 41.4 % (ref 36.0–46.0)
Hemoglobin: 13.7 g/dL (ref 12.0–15.0)
MCH: 27.6 pg (ref 26.0–34.0)
MCHC: 33.1 g/dL (ref 30.0–36.0)
MCV: 83.5 fL (ref 80.0–100.0)
Platelets: 311 10*3/uL (ref 150–400)
RBC: 4.96 MIL/uL (ref 3.87–5.11)
RDW: 13.7 % (ref 11.5–15.5)
WBC: 6.3 10*3/uL (ref 4.0–10.5)
nRBC: 0 % (ref 0.0–0.2)

## 2022-03-30 LAB — TROPONIN I (HIGH SENSITIVITY): Troponin I (High Sensitivity): 4 ng/L (ref <18)

## 2022-03-30 NOTE — ED Provider Triage Note (Signed)
  Emergency Medicine Provider Triage Evaluation Note  MRN:  [id number]  Arrival date & time: 03/30/22    Medically screening exam initiated at 11:58 PM.   CC:   CP  HPI:  Abigail Mendez is a 57 y.o. year-old female presents to the ED with chief complaint of CP.  Onset tonight at 9pm. Reports associated SOB, nausea, and radiating pain to arm and back.  Denies new numbness or weakness in her arms or legs.  Took ASA PTA.  Hx of prior CVA.  History provided by patient. ROS:  -As included in HPI PE:   Vitals:   03/30/22 2344  BP: (!) 146/90  Pulse: 80  Resp: 17  Temp: 98.5 F (36.9 C)  SpO2: 97%    Non-toxic appearing No respiratory distress  MDM:  Based on signs and symptoms, ACS is highest on my differential. I've ordered labs and imaging in triage to expedite lab/diagnostic workup.  Patient was informed that the remainder of the evaluation will be completed by another provider, this initial triage assessment does not replace that evaluation, and the importance of remaining in the ED until their evaluation is complete.    Montine Circle, PA-C 03/30/22 2358

## 2022-03-30 NOTE — ED Triage Notes (Signed)
The pt arrived by [facility] from home  she is c/o a headache hurting all over her body chest pain back pain ems gave her '324mg'$  of aspirin

## 2022-03-31 NOTE — ED Notes (Signed)
Pt left AMA due to wait

## 2022-04-19 ENCOUNTER — Other Ambulatory Visit: Payer: Self-pay | Admitting: *Deleted

## 2022-04-19 DIAGNOSIS — M79606 Pain in leg, unspecified: Secondary | ICD-10-CM

## 2022-04-26 NOTE — Progress Notes (Signed)
Office Note     CC: Bilateral lower extremity leg pain, foot pain Requesting Provider:  Aura Dials, PA-C  HPI: [place] Mendez is a 57 y.o. (01-14-1966) female presenting at the request of .Aura Dials, PA-C for bilateral lower extremity leg pain, foot pain and recent abnormal ABI.  Today, [place] M[place] was doing well.  Originally [place] she moved to [facility] after being diagnosed with fibromyalgia.  She currently works with the hearing impared population, and is a sign Sport and exercise psychologist.  [place] has appreciated bilateral lower extremity numbness, tingling, pain which waxes and wanes.  She arrived today in a wheelchair, due to poor lower extremity strength.  She has had significant dizziness for going on 8 years which has waxed and waned as well.  Currently she is unable to drive due to the dizziness.  Denies symptoms of claudication.  The rest pain she is experiencing waxes and wanes.  No wounds on the feet.  Past Medical History:  Diagnosis Date   Anemia    Anxiety    Asthma    Chronic pain disorder    Daytime somnolence 01/07/2013   Depression    Diabetes mellitus, type II ([place])    Fibromyalgia    GERD (gastroesophageal reflux disease)    Hypertension    Hypothyroidism    MDD (major depressive disorder) 01/07/2013   Obesity    OSA on CPAP 01/07/2013   PTSD (post-traumatic stress disorder)    Seasonal allergies    Sickle cell trait ([place])    Sleep apnea    Vaginitis     Past Surgical History:  Procedure Laterality Date   NO PAST SURGERIES      Social History   Socioeconomic History   Marital status: Single    Spouse name: Not on file   Number of children: Not on file   Years of education: Not on file   Highest education level: Not on file  Occupational History   Not on file  Tobacco Use   Smoking status: Never   Smokeless tobacco: Never  Substance and Sexual Activity   Alcohol use: No   Drug use: No   Sexual activity: Never    Birth  control/protection: Abstinence  Other Topics Concern   Not on file  Social History Narrative   Not on file   Social Determinants of Health   Financial Resource Strain: Not on file  Food Insecurity: Not on file  Transportation Needs: Not on file  Physical Activity: Not on file  Stress: Not on file  Social Connections: Not on file  Intimate Partner Violence: Not on file   Family History  Problem Relation Age of Onset   Breast cancer Sister 57   Bipolar disorder Sister    Suicidality Neg Hx     Current Outpatient Medications  Medication Sig Dispense Refill   albuterol (VENTOLIN HFA) 108 (90 Base) MCG/ACT inhaler INHALE 2 PUFFS INTO THE LUNGS EVERY 6 HOURS AS NEEDED FOR WHEEZING OR SHORTNESS OF BREATH 6.7 g 2   aspirin 81 MG tablet Take 81 mg by mouth daily.     atorvastatin (LIPITOR) 10 MG tablet Take 10 mg by mouth daily.     b complex vitamins tablet Take 1 tablet by mouth daily.     Biotin 1000 MCG tablet Take 1,000 mcg by mouth 3 (three) times daily.     carvedilol (COREG) 6.25 MG tablet Take 6.25 mg by mouth 2 (two) times daily with a meal.  Cholecalciferol (VITAMIN D3) 1000 UNITS CAPS Take 2 tablets by mouth daily.     furosemide (LASIX) 20 MG tablet Take 20 mg by mouth 2 (two) times daily.     Ginkgo Biloba 40 MG TABS Take 60 mg by mouth.     glucose blood test strip 1 each by Other route as needed for other. Use as instructed     leflunomide (ARAVA) 20 MG tablet Take 20 mg by mouth daily.     levothyroxine (SYNTHROID, LEVOTHROID) 50 MCG tablet Take 50 mcg by mouth daily.     losartan (COZAAR) 100 MG tablet Take 100 mg by mouth daily.     Magnesium (MAGNACAPS PO) Take by mouth.     montelukast (SINGULAIR) 10 MG tablet Take 1 tablet (10 mg total) by mouth at bedtime. 30 tablet 11   Multiple Vitamin (MULTIVITAMIN WITH MINERALS) TABS tablet Take 1 tablet by mouth daily.     MYRBETRIQ 25 MG TB24 tablet Take 25 mg by mouth daily.     oxyCODONE-acetaminophen (PERCOCET)  10-325 MG tablet Take 1 tablet by mouth every 4 (four) hours as needed for pain.     POTASSIUM CHLORIDE ER PO Take 10 mg by mouth.     Pyridoxine HCl (VITAMIN B-6 PO) Take by mouth.     Vilazodone HCl (VIIBRYD) 40 MG TABS Take by mouth daily.     VITAMIN D PO Take by mouth.     No current facility-administered medications for this visit.    Allergies  Allergen Reactions   Benztropine Mesylate     Other reaction(s): Unknown   Fluticasone-Salmeterol Nausea Only and Other (See Comments)   Lactose     Other reaction(s): Unknown   Tizanidine Hcl Other (See Comments)    Other reaction(s): Unknown   Amlodipine Swelling    Other reaction(s): Unknown   Benztropine Nausea And Vomiting   Cyclobenzaprine     Hallucinations   Gabapentin     Other reaction(s): Unknown   Lactose Intolerance (Gi) Diarrhea   Lamotrigine     'fall', balance problems   Latex     Redness,swelling,itching, localized pain   Lisinopril     cough   Pregabalin Other (See Comments)   Topamax [Topiramate] Other (See Comments)    Burning in hands and feet   Tramadol Nausea And Vomiting   Trazodone Hcl Other (See Comments)    Other reaction(s): Unknown   Phenylalanine Rash   Sarilumab Itching     REVIEW OF SYSTEMS:  [X]$  denotes positive finding, [ ]$  denotes negative finding Cardiac  Comments:  Chest pain or chest pressure: X   Shortness of breath upon exertion: X   Short of breath when lying flat:    Irregular heart rhythm:        Vascular    Pain in calf, thigh, or hip brought on by ambulation:    Pain in feet at night that wakes you up from your sleep:  X   Blood clot in your veins:    Leg swelling:         Pulmonary    Oxygen at home:    Productive cough:     Wheezing:         Neurologic    Sudden weakness in arms or legs:     Sudden numbness in arms or legs:  X   Sudden onset of difficulty speaking or slurred speech:    Temporary loss of vision in one eye:     Problems  with dizziness:  X        Gastrointestinal    Blood in stool:     Vomited blood:         Genitourinary    Burning when urinating:     Blood in urine:        Psychiatric    Major depression:         Hematologic    Bleeding problems:    Problems with blood clotting too easily:        Skin    Rashes or ulcers:        Constitutional    Fever or chills:      PHYSICAL EXAMINATION:  There were no vitals filed for this visit.  General:  WDWN in NAD; vital signs documented above Gait: Not observed HENT: WNL, normocephalic Pulmonary: normal non-labored breathing , without wheezing Cardiac: regular HR Abdomen: soft, NT, no masses Skin: without rashes Vascular Exam/Pulses:  Right Left  Radial 2+ (normal) 2+ (normal)  Ulnar    Femoral    Popliteal    DP 2+ (normal) 2+ (normal)  PT     Extremities: without ischemic changes, without Gangrene , without cellulitis; without open wounds;  Musculoskeletal: no muscle wasting or atrophy  Neurologic: A&O X 3;  No focal weakness or paresthesias are detected Psychiatric:  The pt has Normal affect.   Non-Invasive Vascular Imaging:   ABI Findings:  +---------+------------------+-----+---------+--------+  Right   Rt Pressure (mmHg)IndexWaveform Comment   +---------+------------------+-----+---------+--------+  Brachial 139                                       +---------+------------------+-----+---------+--------+  PTA     179               1.24 triphasic          +---------+------------------+-----+---------+--------+  DP      180               1.25 triphasic          +---------+------------------+-----+---------+--------+  Great Toe81                0.56 Abnormal           +---------+------------------+-----+---------+--------+   +---------+------------------+-----+---------+-------+  Left    Lt Pressure (mmHg)IndexWaveform Comment  +---------+------------------+-----+---------+-------+  Brachial 144                                       +---------+------------------+-----+---------+-------+  PTA     170               1.18 triphasic         +---------+------------------+-----+---------+-------+  DP      175               1.22 triphasic         +---------+------------------+-----+---------+-------+  Luz Lex                0.67 Abnormal          +---------+------------------+-----+---------+-------+     ASSESSMENT/PLAN: SHEMARIAH TRINER is a 57 y.o. female presenting with a myriad of symptoms including bilateral lower extremity waxing waning pain, neuropathy, weakness, feeling that her feet are"wet" and when dry.  PIs were reviewed and were found to be normal.  She does have some decreased toe  pressure which is likely from longstanding diabetes mellitus.  On physical exam, she had a palpable pulse in the feet.  I had a long conversation with Sitara regarding the above.  Congratulated her on her A1c, and discussed the long-term effects of diabetes on the vascular and microvascular systems.  Specifically, we discussed progressive neuropathy, microvascular disease, and future need of amputation, which especially occurs in patients with uncontrolled diabetes.  Fortunately, ABIs are normal at this time.  Makhila can follow-up with my office as needed.   Broadus John, MD [facility] [phone]

## 2022-04-27 ENCOUNTER — Ambulatory Visit: Payer: Medicare HMO | Admitting: Vascular Surgery

## 2022-04-27 ENCOUNTER — Encounter: Payer: Self-pay | Admitting: Adult Health

## 2022-04-27 ENCOUNTER — Encounter: Payer: Self-pay | Admitting: Vascular Surgery

## 2022-04-27 ENCOUNTER — Ambulatory Visit (HOSPITAL_COMMUNITY)
Admission: RE | Admit: 2022-04-27 | Discharge: 2022-04-27 | Disposition: A | Discharge status: Home / Self Care | Payer: Medicare HMO | Source: Ambulatory Visit | Attending: Vascular Surgery | Admitting: Vascular Surgery

## 2022-04-27 VITALS — BP 121/70 | HR 74 | Temp 97.8°F | Resp 16 | Ht 65.0 in | Wt 235.0 lb

## 2022-04-27 DIAGNOSIS — M79605 Pain in left leg: Secondary | ICD-10-CM

## 2022-04-27 DIAGNOSIS — M79604 Pain in right leg: Secondary | ICD-10-CM

## 2022-04-27 DIAGNOSIS — M79606 Pain in leg, unspecified: Secondary | ICD-10-CM | POA: Diagnosis present

## 2022-04-27 DIAGNOSIS — G4733 Obstructive sleep apnea (adult) (pediatric): Secondary | ICD-10-CM

## 2022-04-27 LAB — VAS US ABI WITH/WO TBI
Left ABI: 1.22
Right ABI: 1.25

## 2022-04-30 ENCOUNTER — Encounter: Payer: Self-pay | Admitting: Vascular Surgery

## 2022-04-30 NOTE — Telephone Encounter (Signed)
I called [facility] and discussed. They have the pt's last machine setup date as 01/17/2021. It appears as though perhaps that profile didn't have all the information so they created a new profile for the same machine and it looks like a new setup date but it is not. I am sending to reports to [facility] NP to look at.

## 2022-05-01 ENCOUNTER — Encounter: Payer: Medicare HMO | Admitting: Adult Health

## 2022-05-01 ENCOUNTER — Telehealth: Payer: Medicare HMO | Admitting: Adult Health

## 2022-05-01 ENCOUNTER — Other Ambulatory Visit: Payer: Self-pay | Admitting: Adult Health

## 2022-05-01 DIAGNOSIS — G4733 Obstructive sleep apnea (adult) (pediatric): Secondary | ICD-10-CM

## 2022-05-01 NOTE — Progress Notes (Signed)
This encounter was created in error - please disregard.

## 2022-05-04 ENCOUNTER — Ambulatory Visit (HOSPITAL_BASED_OUTPATIENT_CLINIC_OR_DEPARTMENT_OTHER)
Admission: RE | Admit: 2022-05-04 | Discharge: 2022-05-04 | Disposition: A | Discharge status: Home / Self Care | Payer: Medicare HMO | Source: Ambulatory Visit | Attending: Pulmonary Disease | Admitting: Pulmonary Disease

## 2022-05-04 DIAGNOSIS — R911 Solitary pulmonary nodule: Secondary | ICD-10-CM | POA: Diagnosis present

## 2022-05-07 ENCOUNTER — Ambulatory Visit (INDEPENDENT_AMBULATORY_CARE_PROVIDER_SITE_OTHER): Payer: Medicare HMO | Admitting: Pulmonary Disease

## 2022-05-07 ENCOUNTER — Encounter (HOSPITAL_BASED_OUTPATIENT_CLINIC_OR_DEPARTMENT_OTHER): Payer: Self-pay | Admitting: Pulmonary Disease

## 2022-05-07 VITALS — BP 118/80 | HR 82 | Ht 65.0 in | Wt 233.4 lb

## 2022-05-07 DIAGNOSIS — J4530 Mild persistent asthma, uncomplicated: Secondary | ICD-10-CM

## 2022-05-07 DIAGNOSIS — R911 Solitary pulmonary nodule: Secondary | ICD-10-CM

## 2022-05-07 LAB — PULMONARY FUNCTION TEST
DL/VA % pred: 133 %
DL/VA: 5.62 ml/min/mmHg/L
DLCO cor % pred: 93 %
DLCO cor: 19.7 ml/min/mmHg
DLCO unc % pred: 94 %
DLCO unc: 19.88 ml/min/mmHg
FEF 25-75 Post: 3.42 L/sec
FEF 25-75 Pre: 2.76 L/sec
FEF2575-%Change-Post: 23 %
FEF2575-%Pred-Post: 137 %
FEF2575-%Pred-Pre: 110 %
FEV1-%Change-Post: 6 %
FEV1-%Pred-Post: 84 %
FEV1-%Pred-Pre: 78 %
FEV1-Post: 2.27 L
FEV1-Pre: 2.13 L
FEV1FVC-%Change-Post: 6 %
FEV1FVC-%Pred-Pre: 106 %
FEV6-%Change-Post: 0 %
FEV6-%Pred-Post: 75 %
FEV6-%Pred-Pre: 75 %
FEV6-Post: 2.54 L
FEV6-Pre: 2.53 L
FEV6FVC-%Change-Post: 0 %
FEV6FVC-%Pred-Post: 103 %
FEV6FVC-%Pred-Pre: 102 %
FVC-%Change-Post: 0 %
FVC-%Pred-Post: 73 %
FVC-%Pred-Pre: 73 %
FVC-Post: 2.54 L
FVC-Pre: 2.54 L
Post FEV1/FVC ratio: 89 %
Post FEV6/FVC ratio: 100 %
Pre FEV1/FVC ratio: 84 %
Pre FEV6/FVC Ratio: 99 %
RV % pred: 71 %
RV: 1.43 L
TLC % pred: 75 %
TLC: 3.93 L

## 2022-05-07 NOTE — Telephone Encounter (Signed)
[facility] confirmed receipt of order

## 2022-05-07 NOTE — Patient Instructions (Addendum)
  Mild persistent asthma, well-controlled. Not in exacerbation Mild restrictive defect with normal DLCO --PFTs with no obstructive defect. Mild restrictive defect with normal gas exchange --Unable tolerate Advair Diskus due to side effects (ill-feeling, nausea, hunger etc) --Shortness of breath does not seem pulmonary in etiology --No maintenance inhalers indicated --CONTINUE Albuterol AS NEEDED for shortness of breath, wheezing, chest tightness --CONTINUE montelukast 10 mg daily --Patient declined Pulmonary rehab for now. Currently working with physical therapy  Right lower lobe lung nodules --Enlarged RLL lung nodule from 1.1>1.4cm over 2014-2023. Unclear significance however will monitor for potential ongoing growth --Reviewed CT today. Overall stable. Will contact radiology for final read --ORDER CT Chest without contrast in 6 months  OSA --Currently following with [facility] --Continue CPAP daily --REQUEST sleep test results from 10/01/17 from [place] with me in 6 months

## 2022-05-07 NOTE — Telephone Encounter (Signed)
Placed order per [facility] NP for increased cpap pressure to 17. I have made the change in [facility] and sent the order to [facility].

## 2022-05-07 NOTE — Addendum Note (Signed)
Addended by: Gildardo Griffes on: 05/07/2022 09:24 AM   Modules accepted: Orders

## 2022-05-07 NOTE — Progress Notes (Signed)
Full PFT Performed Today. 

## 2022-05-07 NOTE — Patient Instructions (Signed)
Full PFT Performed Today. 

## 2022-05-07 NOTE — Progress Notes (Unsigned)
Subjective:   PATIENT ID: [place] Mendez GENDER: female DOB: 07/17/1965, MRN: [id number]   HPI  Chief Complaint  Patient presents with   Follow-up    PFT results    Reason for Visit: PFT follow-up  [place] Mendez is a 57 year old female never smoker with adult-onset asthma, CKD IIIa, RA, DM2, HTN, chronic anemia who presents for establish pulmonary care.  Initial consult She was last seen by a Pulmonologist in 2014 for asthma, sleep apnea and reported hx of pulmonary nodules. Reviewed CT reports in [place] as noted below.  She reports shortness of breath with chest tightness. Not currently having wheezing but will have it when symptoms are severe. Associated with nasal congestion at night. Shortness of breath worsens at night. Occasional cough. No known triggers for this current episode. Previously triggered by animal dander, cigarette smoking, humidity, cold air and illness. She is currently on albuterol and rarely uses it. Not currently on maintenance inhalers. Previously on Advair.   02/01/22 Since our last visit she reports she is doing fair with symptoms similar with shortness of breath and chest tightness. Using rescue inhaler as needed but this is rare. Reports trial of Advair for one month however associated with ill feelings including malaise, nausea, intermittent hunger. Once she self-discontinued symptoms self-resolved.   05/07/22 Since our last visit she reports she still has a difficult to take a breath at times. She continues to have shortness of breath and chest "tightness." Very rarely has wheezing.   Asthma Control Test ACT Total Score  05/07/2022 10:31 AM 19    Social History: Never smoker  Past Medical History:  Diagnosis Date   Anemia    Anxiety    Asthma    Chronic pain disorder    Daytime somnolence 01/07/2013   Depression    Diabetes mellitus, type II ([place])    Fibromyalgia    GERD (gastroesophageal reflux disease)    Hypertension     Hypothyroidism    MDD (major depressive disorder) 01/07/2013   Obesity    OSA on CPAP 01/07/2013   PTSD (post-traumatic stress disorder)    Seasonal allergies    Sickle cell trait ([facility])    Sleep apnea    Vaginitis      Family History  Problem Relation Age of Onset   Breast cancer Sister 65   Bipolar disorder Sister    Suicidality Neg Hx      Social History   Occupational History   Not on file  Tobacco Use   Smoking status: Never   Smokeless tobacco: Never  Substance and Sexual Activity   Alcohol use: No   Drug use: No   Sexual activity: Never    Birth control/protection: Abstinence    Allergies  Allergen Reactions   Benztropine Mesylate     Other reaction(s): Unknown   Fluticasone-Salmeterol Nausea Only and Other (See Comments)   Lactose     Other reaction(s): Unknown   Tizanidine Hcl Other (See Comments)    Other reaction(s): Unknown   Amlodipine Swelling    Other reaction(s): Unknown   Benztropine Nausea And Vomiting   Cyclobenzaprine     Hallucinations   Gabapentin     Other reaction(s): Unknown   Lactose Intolerance (Gi) Diarrhea   Lamotrigine     'fall', balance problems   Latex     Redness,swelling,itching, localized pain   Lisinopril     cough   Pregabalin Other (See Comments)   Topamax [Topiramate] Other (See  Comments)    Burning in hands and feet   Tramadol Nausea And Vomiting   Trazodone Hcl Other (See Comments)    Other reaction(s): Unknown   Phenylalanine Rash   Sarilumab Itching     Outpatient Medications Prior to Visit  Medication Sig Dispense Refill   acetaminophen (TYLENOL) 325 MG tablet Take by mouth.     albuterol (VENTOLIN HFA) 108 (90 Base) MCG/ACT inhaler INHALE 2 PUFFS INTO THE LUNGS EVERY 6 HOURS AS NEEDED FOR WHEEZING OR SHORTNESS OF BREATH 6.7 g 2   amLODipine (NORVASC) 10 MG tablet Take by mouth.     aspirin 81 MG tablet Take 81 mg by mouth daily.     atorvastatin (LIPITOR) 10 MG tablet Take 10 mg by mouth daily.     b  complex vitamins tablet Take 1 tablet by mouth daily.     Biotin 1000 MCG tablet Take 1,000 mcg by mouth 3 (three) times daily.     carvedilol (COREG) 12.5 MG tablet Take 12.5 mg by mouth 2 (two) times daily with a meal.     Cholecalciferol (VITAMIN D3) 1000 UNITS CAPS Take 2 tablets by mouth daily.     folic acid (FOLVITE) [id number] MCG tablet Take by mouth.     furosemide (LASIX) 20 MG tablet Take 20 mg by mouth 2 (two) times daily.     Ginkgo Biloba 40 MG TABS Take 60 mg by mouth.     glucose blood test strip 1 each by Other route as needed for other. Use as instructed     hydrALAZINE (APRESOLINE) 25 MG tablet Take by mouth 3 (three) times daily.     hydrOXYzine (VISTARIL) 25 MG capsule 3 (three) times daily.     leflunomide (ARAVA) 20 MG tablet Take 20 mg by mouth daily.     levothyroxine (SYNTHROID, LEVOTHROID) 50 MCG tablet Take 50 mcg by mouth daily.     losartan (COZAAR) 100 MG tablet Take 100 mg by mouth daily.     Magnesium (MAGNACAPS PO) Take by mouth.     montelukast (SINGULAIR) 10 MG tablet Take 1 tablet (10 mg total) by mouth at bedtime. 30 tablet 11   Multiple Vitamin (MULTIVITAMIN WITH MINERALS) TABS tablet Take 1 tablet by mouth daily.     MYRBETRIQ 25 MG TB24 tablet Take 25 mg by mouth daily.     oxyCODONE-acetaminophen (PERCOCET) 10-325 MG tablet Take 1 tablet by mouth every 4 (four) hours as needed for pain.     POTASSIUM CHLORIDE ER PO Take 10 mg by mouth.     Pyridoxine HCl (VITAMIN B-6 PO) Take by mouth.     Semaglutide (OZEMPIC, 0.25 OR 0.5 MG/DOSE, [place])      Vilazodone HCl (VIIBRYD) 40 MG TABS Take by mouth daily.     carvedilol (COREG) 6.25 MG tablet Take 6.25 mg by mouth 2 (two) times daily with a meal.     VITAMIN D PO Take by mouth.     No facility-administered medications prior to visit.    Review of Systems  Constitutional:  Negative for chills, diaphoresis, fever, malaise/fatigue and weight loss.  HENT:  Negative for congestion.   Respiratory:  Positive for  cough, shortness of breath and wheezing. Negative for hemoptysis and sputum production.   Cardiovascular:  Negative for chest pain (chest tightness), palpitations and leg swelling.       Chest tightness      Objective:   Vitals:   05/07/22 1021  BP: 118/80  Pulse: 82  SpO2: 99%  Weight: 233 lb 6.4 oz (105.9 kg)  Height: 5' 5"$  (1.651 m)  Body mass index is 38.84 kg/m.  SpO2: 99 % O2 Device: None (Room air)  Physical Exam: General: Well-appearing, no acute distress HENT: [place], AT Eyes: EOMI, no scleral icterus Respiratory: Clear to auscultation bilaterally.  No crackles, wheezing or rales Cardiovascular: RRR, -M/R/G, no JVD Extremities:-Edema,-tenderness Neuro: AAO x4, CNII-XII grossly intact Psych: Normal mood, normal affect  Data Reviewed:  Imaging: CT Chest 08/11/12  [facility] (report only)- No adenopathy. "No change in 1.1 cm nodule or scar at the right lung base in image 86. Overall area of groundglass opacity in the lingular around image 64 is increased. There are 2 new areas of groundglass opacity in the right middle lobe"  CT Chest 01/18/22 RLL lung nodules measuring 1.4 x 1 cm, unchanged compared to 01/14/22. Interval enlargement compared to 2014.  PFT: None of file  Labs: CBC    Component Value Date/Time   WBC 6.3 03/30/2022 2251   RBC 4.96 03/30/2022 2251   HGB 13.7 03/30/2022 2251   HCT 41.4 03/30/2022 2251   PLT 311 03/30/2022 2251   MCV 83.5 03/30/2022 2251   MCH 27.6 03/30/2022 2251   MCHC 33.1 03/30/2022 2251   RDW 13.7 03/30/2022 2251   LYMPHSABS 3.4 01/13/2022 2023   MONOABS 0.9 01/13/2022 2023   EOSABS 0.3 01/13/2022 2023   BASOSABS 0.1 01/13/2022 2023  Chronic anemia  Sleep: [facility] 10/01/21 - Unable to view records     Assessment & Plan:   Discussion: 57 year old female never smoker with adult onset asthma, CKD IIIa, RA, DM2, HTN, chronic anemia who presents for follow-up. Did not tolerate low-dose ICS/LABA. Will continue management with SABA  until PFTs completed. Reports fair control for now. Reviewed CT imaging as noted below.   Shorntess of breath likely related to deconditioning  Mild persistent asthma, not in exacerbation --PFTs with no obstructive defect. Mild restrictive defect with normal gas exchange --Unable tolerate Advair Diskus due to side effects (ill-feeling, nausea, hunger etc) --CONTINUE Albuterol AS NEEDED for shortness of breath, wheezing, chest tightness --CONTINUE montelukast 10 mg daily  Right lower lobe lung nodules --Enlarged RLL lung nodule from 1.1>1.4cm over 2014-2023. Unclear significance however will monitor for potential ongoing growth --Reviewed CT today. Overall stable. Will contact radiology for final ready --ORDER CT Chest without contrast in 6 months  OSA --Currently following with [facility] --Continue CPAP daily --REQUEST sleep test results from 10/01/17 from [facility] Maintenance Immunization History  Administered Date(s) Administered   Influenza Inj Mdck Quad Pf 01/03/2021   Influenza Split 01/08/2012, 12/25/2012, 12/24/2015, 01/02/2021   Influenza, Quadrivalent, Recombinant, Inj, Pf 12/18/2019   Influenza,inj,Quad PF,6+ Mos 12/18/2019   Influenza,inj,Quad PF,6-35 Mos 01/06/2019   Influenza,trivalent, recombinat, inj, PF 12/10/2014, 01/01/2017   Influenza-Unspecified 01/10/2011, 12/24/2015, 01/03/2017, 12/31/2017   PFIZER Comirnaty(Gray Top)Covid-19 Tri-Sucrose Vaccine 10/20/2020   PFIZER(Purple Top)SARS-COV-2 Vaccination 06/18/2019, 07/09/2019, 11/19/2019, 11/21/2019   Pfizer Covid-19 Vaccine Bivalent Booster 34yr & up 12/26/2020, 02/27/2022   Pneumococcal Conjugate-13 07/13/2014   Pneumococcal Polysaccharide-23 04/14/2012   Tdap 07/17/2004, 05/27/2014   Tetanus 05/27/2014   Zoster Recombinat (Shingrix) 02/19/2019, 04/27/2019   CT Lung Screen - not qualified. Never smoker  No orders of the defined types were placed in this encounter.  No  orders of the defined types were placed in this encounter.   No follow-ups on file.  I have spent a total time of 31-minutes on the day of  the appointment including chart review, data review, collecting history, coordinating care and discussing medical diagnosis and plan with the patient/family. Past medical history, allergies, medications were reviewed. Pertinent imaging, labs and tests included in this note have been reviewed and interpreted independently by me.  [place], MD [facility] 05/07/2022 10:39 AM  Office Number [phone]

## 2022-05-08 ENCOUNTER — Encounter (HOSPITAL_BASED_OUTPATIENT_CLINIC_OR_DEPARTMENT_OTHER): Payer: Self-pay | Admitting: Pulmonary Disease

## 2022-05-08 NOTE — Telephone Encounter (Signed)
Change to autoset 8-17 EPR3

## 2022-05-09 NOTE — Addendum Note (Signed)
Addended by: Gildardo Griffes on: 05/09/2022 07:38 AM   Modules accepted: Orders

## 2022-05-09 NOTE — Telephone Encounter (Signed)
Order sent to [facility] for their records.

## 2022-05-10 ENCOUNTER — Encounter (HOSPITAL_BASED_OUTPATIENT_CLINIC_OR_DEPARTMENT_OTHER): Payer: Self-pay | Admitting: Pulmonary Disease

## 2022-07-18 ENCOUNTER — Encounter: Payer: Self-pay | Admitting: *Deleted

## 2022-07-18 NOTE — Progress Notes (Unsigned)
PATIENT: Abigail Mendez DOB: [birth year]  REASON FOR VISIT: follow up HISTORY FROM: patient PRIMARY NEUROLOGIST: Dr. Frances Furbish  Virtual Visit via Video Note  I connected with Abigail Mendez on 07/19/22 at 11:45 AM EDT by a video enabled telemedicine application located remotely at [facility] and verified that I am speaking with the correct person using two identifiers who was located at their own home.   I discussed the limitations of evaluation and management by telemedicine and the availability of in person appointments. The patient expressed understanding and agreed to proceed.   PATIENT: Abigail Mendez DOB: Jul 08, 1965  REASON FOR VISIT: follow up HISTORY FROM: patient  HISTORY OF PRESENT ILLNESS: Today 07/19/22:  Abigail Mendez is a 57 y.o. female with a history of OSA on CPAP. Returns today for follow-up.  She reports that she still feels daytime sleepiness and fatigue.  She does have a diagnosis of chronic fatigue syndrome and fibromyalgia.  She is concerned because on her machine each night she may have 8 or 9 events an hour.  I did explain that the average has been 5 events an hour which is acceptable treatment.  Also explained that when she was on a set pressure her events were lower but she has preferred AutoSet.  Download is below       Abigail Mendez is a 57 year old female with a history of OSA on CPAP. She returns today for follow-up. Reports CPAP gets water in the mask. Has tried adjusting humidity. Download is below.     REVIEW OF SYSTEMS: Out of a complete 14 system review of symptoms, the patient complains only of the following symptoms, and all other reviewed systems are negative.  ALLERGIES: Allergies  Allergen Reactions   Benztropine Mesylate     Other reaction(s): Unknown   Fluticasone-Salmeterol Nausea Only and Other (See Comments)   Lactose     Other reaction(s): Unknown   Tizanidine Hcl Other (See Comments)    Other  reaction(s): Unknown   Amlodipine Swelling    Other reaction(s): Unknown   Benztropine Nausea And Vomiting   Cyclobenzaprine     Hallucinations   Gabapentin     Other reaction(s): Unknown   Lactose Intolerance (Gi) Diarrhea   Lamotrigine     'fall', balance problems   Latex     Redness,swelling,itching, localized pain   Lisinopril     cough   Pregabalin Other (See Comments)   Topamax [Topiramate] Other (See Comments)    Burning in hands and feet   Tramadol Nausea And Vomiting   Trazodone Hcl Other (See Comments)    Other reaction(s): Unknown   Phenylalanine Rash   Sarilumab Itching    HOME MEDICATIONS: Outpatient Medications Prior to Visit  Medication Sig Dispense Refill   acetaminophen (TYLENOL) 325 MG tablet Take by mouth.     albuterol (VENTOLIN HFA) 108 (90 Base) MCG/ACT inhaler INHALE 2 PUFFS INTO THE LUNGS EVERY 6 HOURS AS NEEDED FOR WHEEZING OR SHORTNESS OF BREATH 6.7 g 2   amLODipine (NORVASC) 10 MG tablet Take by mouth.     aspirin 81 MG tablet Take 81 mg by mouth daily.     atorvastatin (LIPITOR) 10 MG tablet Take 10 mg by mouth daily.     b complex vitamins tablet Take 1 tablet by mouth daily.     Biotin 1000 MCG tablet Take 1,000 mcg by mouth 3 (three) times daily.     carvedilol (COREG) 12.5 MG tablet  Take 12.5 mg by mouth 2 (two) times daily with a meal.     carvedilol (COREG) 6.25 MG tablet Take 6.25 mg by mouth 2 (two) times daily with a meal.     Cholecalciferol (VITAMIN D3) 1000 UNITS CAPS Take 2 tablets by mouth daily.     folic acid (FOLVITE) 400 MCG tablet Take by mouth.     furosemide (LASIX) 20 MG tablet Take 20 mg by mouth 2 (two) times daily.     Ginkgo Biloba 40 MG TABS Take 60 mg by mouth.     glucose blood test strip 1 each by Other route as needed for other. Use as instructed     hydrALAZINE (APRESOLINE) 25 MG tablet Take by mouth 3 (three) times daily.     hydrOXYzine (VISTARIL) 25 MG capsule 3 (three) times daily.     leflunomide (ARAVA) 20  MG tablet Take 20 mg by mouth daily.     levothyroxine (SYNTHROID, LEVOTHROID) 50 MCG tablet Take 50 mcg by mouth daily.     losartan (COZAAR) 100 MG tablet Take 100 mg by mouth daily.     Magnesium (MAGNACAPS PO) Take by mouth.     montelukast (SINGULAIR) 10 MG tablet Take 1 tablet (10 mg total) by mouth at bedtime. 30 tablet 11   Multiple Vitamin (MULTIVITAMIN WITH MINERALS) TABS tablet Take 1 tablet by mouth daily.     MYRBETRIQ 25 MG TB24 tablet Take 25 mg by mouth daily.     oxyCODONE-acetaminophen (PERCOCET) 10-325 MG tablet Take 1 tablet by mouth every 4 (four) hours as needed for pain.     POTASSIUM CHLORIDE ER PO Take 10 mg by mouth.     Pyridoxine HCl (VITAMIN B-6 PO) Take by mouth.     Semaglutide (OZEMPIC, 0.25 OR 0.5 MG/DOSE, [place])      Vilazodone HCl (VIIBRYD) 40 MG TABS Take by mouth daily.     No facility-administered medications prior to visit.    PAST MEDICAL HISTORY: Past Medical History:  Diagnosis Date   Anemia    Anxiety    Asthma    Chronic pain disorder    Daytime somnolence 01/07/2013   Depression    Diabetes mellitus, type II (HCC)    Fibromyalgia    GERD (gastroesophageal reflux disease)    Hypertension    Hypothyroidism    MDD (major depressive disorder) 01/07/2013   Obesity    OSA on CPAP 01/07/2013   PTSD (post-traumatic stress disorder)    Seasonal allergies    Sickle cell trait (HCC)    Sleep apnea    Vaginitis     PAST SURGICAL HISTORY: Past Surgical History:  Procedure Laterality Date   NO PAST SURGERIES      FAMILY HISTORY: Family History  Problem Relation Age of Onset   Breast cancer Sister 36   Bipolar disorder Sister    Suicidality Neg Hx     SOCIAL HISTORY: Social History   Socioeconomic History   Marital status: Single    Spouse name: Not on file   Number of children: Not on file   Years of education: Not on file   Highest education level: Not on file  Occupational History   Not on file  Tobacco Use   Smoking  status: Never   Smokeless tobacco: Never  Substance and Sexual Activity   Alcohol use: No   Drug use: No   Sexual activity: Never    Birth control/protection: Abstinence  Other Topics Concern   Not  on file  Social History Narrative   Not on file   Social Determinants of Health   Financial Resource Strain: Not on file  Food Insecurity: Not on file  Transportation Needs: Not on file  Physical Activity: Not on file  Stress: Not on file  Social Connections: Not on file  Intimate Partner Violence: Not on file      PHYSICAL EXAM Generalized: Well developed, in no acute distress   Neurological examination  Mentation: Alert oriented to time, place, history taking. Follows all commands speech and language fluent Cranial nerve II-XII: Facial symmetry noted.   DIAGNOSTIC DATA (LABS, IMAGING, TESTING) - I reviewed patient records, labs, notes, testing and imaging myself where available.  Lab Results  Component Value Date   WBC 6.3 03/30/2022   HGB 13.7 03/30/2022   HCT 41.4 03/30/2022   MCV 83.5 03/30/2022   PLT 311 03/30/2022      Component Value Date/Time   NA 140 03/30/2022 2251   K 4.4 03/30/2022 2251   CL 102 03/30/2022 2251   CO2 29 03/30/2022 2251   GLUCOSE 95 03/30/2022 2251   BUN 8 03/30/2022 2251   CREATININE 1.38 (H) 03/30/2022 2251   CALCIUM 9.7 03/30/2022 2251   PROT 7.6 01/13/2022 2023   ALBUMIN 4.1 01/13/2022 2023   AST 26 01/13/2022 2023   ALT 23 01/13/2022 2023   ALKPHOS 67 01/13/2022 2023   BILITOT 0.7 01/13/2022 2023   GFRNONAA 44 (L) 03/30/2022 2251   GFRAA 41 (L) 08/22/2015 1900   Lab Results  Component Value Date   TSH 2.039 02/01/2010      ASSESSMENT AND PLAN 57 y.o. year old female  has a past medical history of Anemia, Anxiety, Asthma, Chronic pain disorder, Daytime somnolence (01/07/2013), Depression, Diabetes mellitus, type II (HCC), Fibromyalgia, GERD (gastroesophageal reflux disease), Hypertension, Hypothyroidism, MDD (major  depressive disorder) (01/07/2013), Obesity, OSA on CPAP (01/07/2013), PTSD (post-traumatic stress disorder), Seasonal allergies, Sickle cell trait (HCC), Sleep apnea, and Vaginitis. here with:  OSA on CPAP  CPAP compliance excellent Residual AHI is good Encouraged patient to continue using CPAP nightly and > 4 hours each night Will adjust pressure 9-17 cm H20 F/U in 6 months with Dr. Frances Furbish or sooner if needed   Butch Penny, MSN, NP-C 07/19/2022, 11:40 AM [facility] [address], [address] [place], [place] [address] [phone]

## 2022-07-19 ENCOUNTER — Telehealth: Payer: Medicare HMO | Admitting: Adult Health

## 2022-07-19 DIAGNOSIS — G4733 Obstructive sleep apnea (adult) (pediatric): Secondary | ICD-10-CM | POA: Diagnosis not present

## 2022-07-29 ENCOUNTER — Encounter: Payer: Self-pay | Admitting: Adult Health

## 2022-07-29 DIAGNOSIS — Z789 Other specified health status: Secondary | ICD-10-CM

## 2022-07-29 DIAGNOSIS — G4733 Obstructive sleep apnea (adult) (pediatric): Secondary | ICD-10-CM

## 2022-07-30 NOTE — Telephone Encounter (Signed)
   Order for pressure adjustment sent to [facility]

## 2022-07-30 NOTE — Telephone Encounter (Signed)
[facility] confirmed order. 

## 2022-07-31 NOTE — Addendum Note (Signed)
Addended by: Huston Foley on: 07/31/2022 03:01 PM   Modules accepted: Orders

## 2022-07-31 NOTE — Telephone Encounter (Signed)
I agree with the set pressure trial. We can do CPAP of 10 cm with EPR of 3 for the next mo and take another look at her DL.

## 2022-08-02 NOTE — Telephone Encounter (Signed)
New, Doristine Mango, RN; Penni Homans; Marveen Reeks; Santina Evans Received, Thank you!     Previous Messages    ----- Message ----- From: Guy Begin, RN Sent: 08/01/2022  11:31 AM EDT To: Henderson Newcomer; Kathyrn Sheriff; Santina Evans; * Subject: change autopap to CPAP                        New order in EPIC   Darrien Z. Twardzik Female, 57 y.o., [birth year] MRN: [id number]  [facility] RN   Thank you.

## 2022-08-06 ENCOUNTER — Encounter (HOSPITAL_BASED_OUTPATIENT_CLINIC_OR_DEPARTMENT_OTHER): Payer: Self-pay | Admitting: Pulmonary Disease

## 2022-08-07 NOTE — Telephone Encounter (Signed)
I returned patient phone care regarding the following:  1) CT scan 10/17/2012. Lung nodule measuring ~58mm in RLL. This was a report only but  I believe I did receive a physical disc in March and reviewed it but unfortunately did not document this and do not see imaging uploaded. However it is reassuring that her more recent CTs from 01/18/22 and 05/04/22 are similar unchanged size. Already planned for repeat CT chest in August 2024 to ensure stability. She requests we send this imaging to her Rheumatologist. She queries whether her recent suffocating sensation when laying down is related to this. See part 2  2) She is followed by Neurology for OSA. She is planned for CPAP trial this month. Her recent symptoms could be related to this and advised to discuss with her Sleep/Neuro doctor. We have not yet received PSG with MLST from 10/01/17 from [place] despite multiple fax requests. Patient plans to obtain records herself which I appreciate since it has been difficult.

## 2022-09-04 IMAGING — MG MM DIGITAL SCREENING BILAT W/ TOMO AND CAD
6 of 10 series · 6 of 30 positions shown · non-contrast
Comparison: Previous exam(s).

CLINICAL DATA: Screening.

EXAM:
DIGITAL SCREENING BILATERAL MAMMOGRAM WITH TOMOSYNTHESIS AND CAD
TECHNIQUE: Bilateral screening digital craniocaudal and mediolateral oblique
mammograms were obtained. Bilateral screening digital breast
tomosynthesis was performed. The images were evaluated with
computer-aided detection.

[L CC synth-2D (1 of 2)]
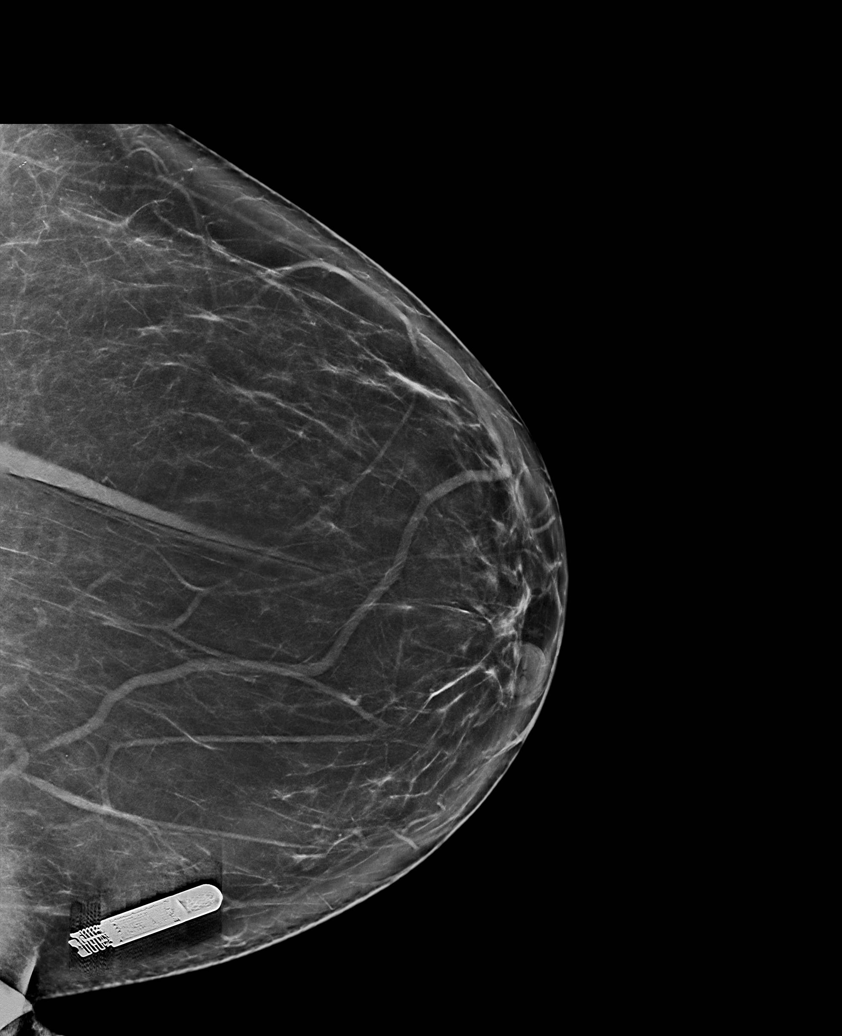

[L MLO synth-2D]
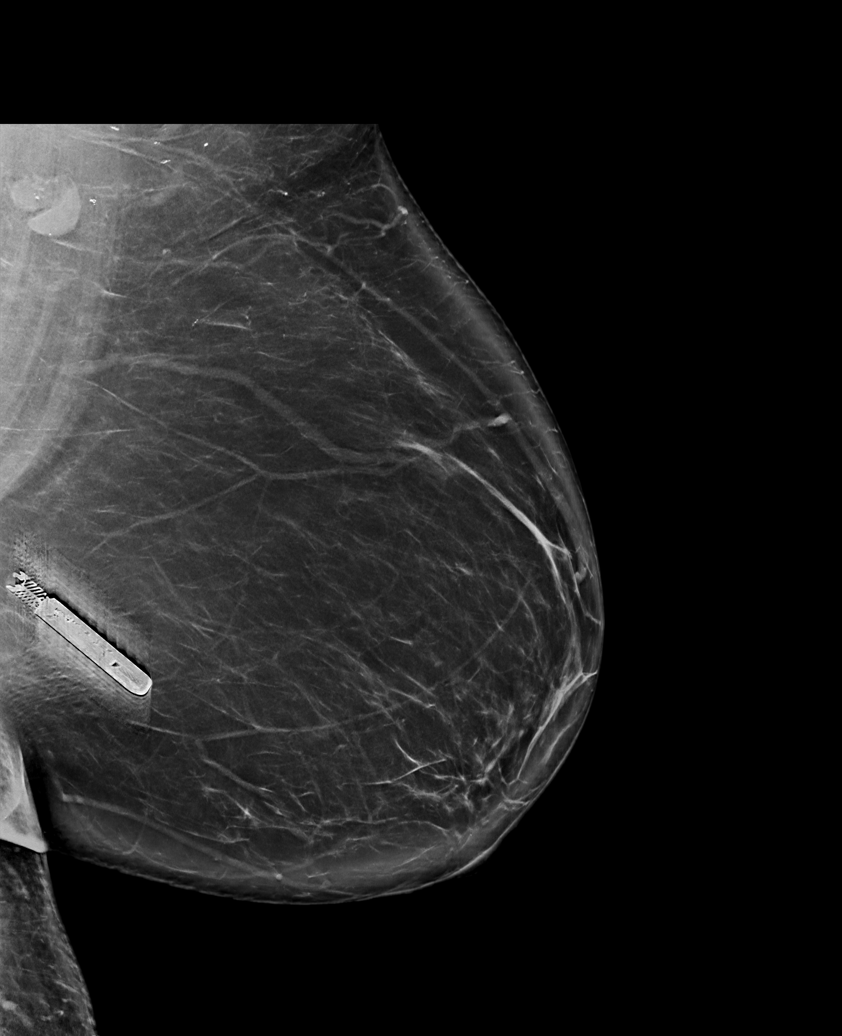

[R MLO synth-2D]
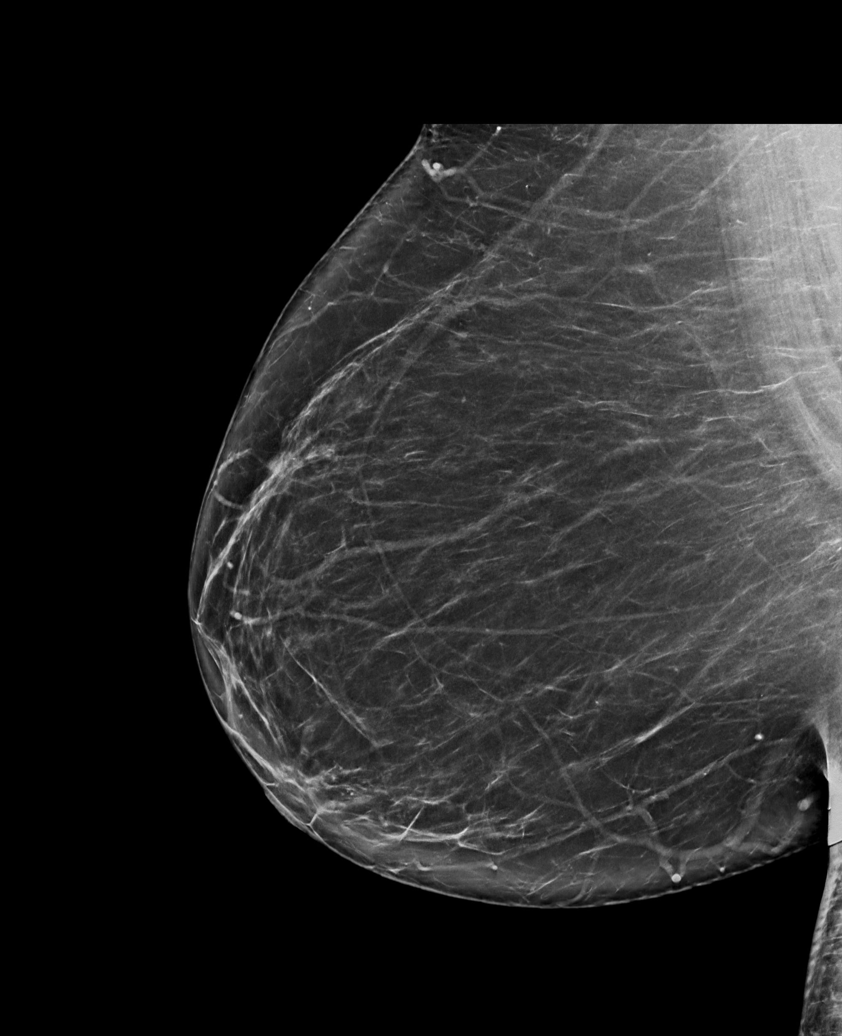

[R CC synth-2D]
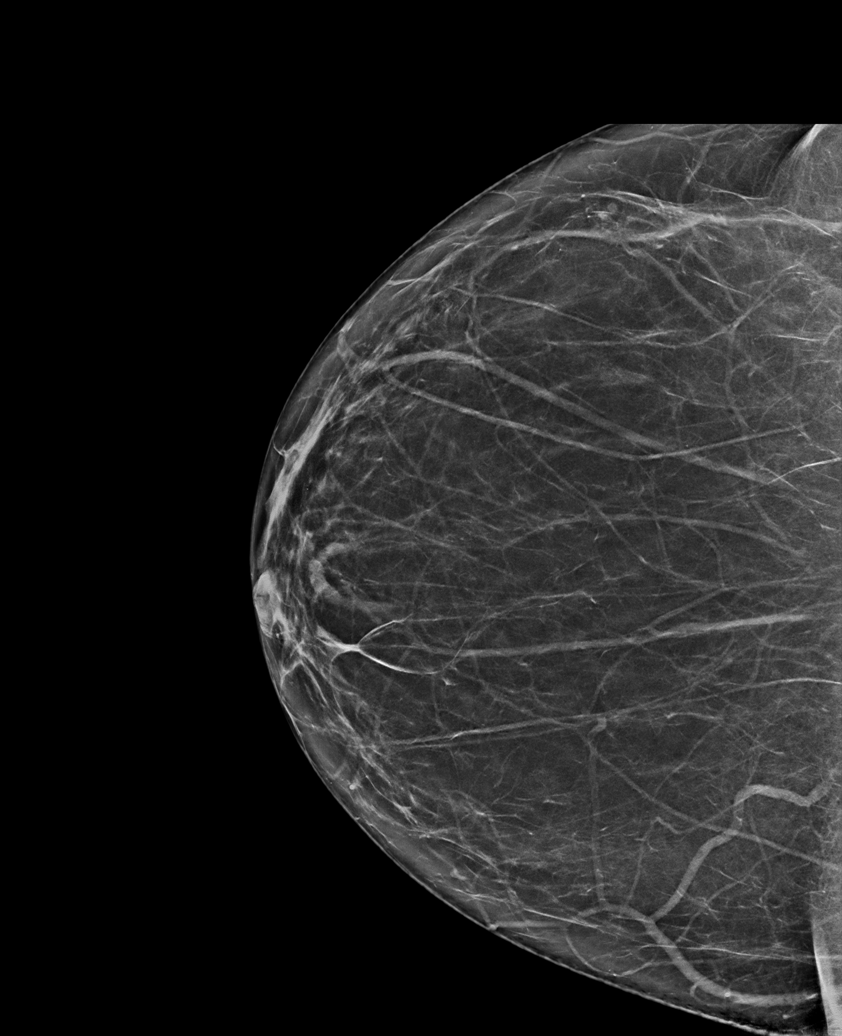

[L CC synth-2D (2 of 2)]
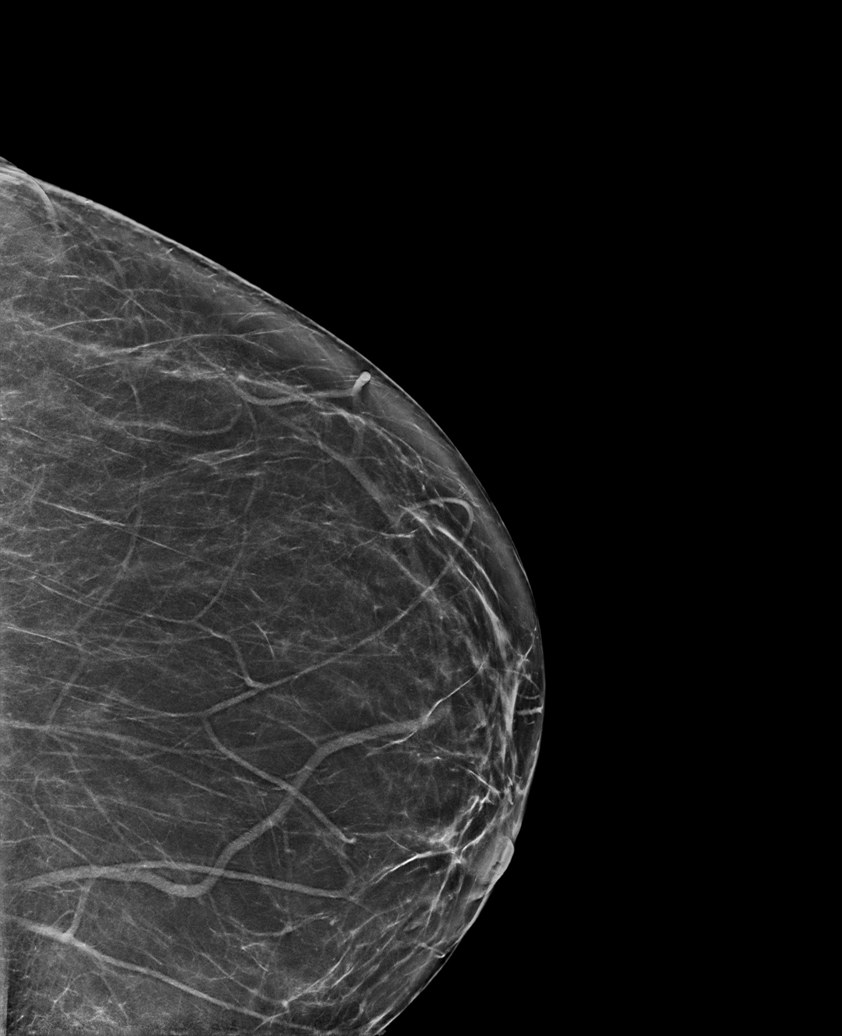

[R MLO tomo · tomo slice 47/92.0]
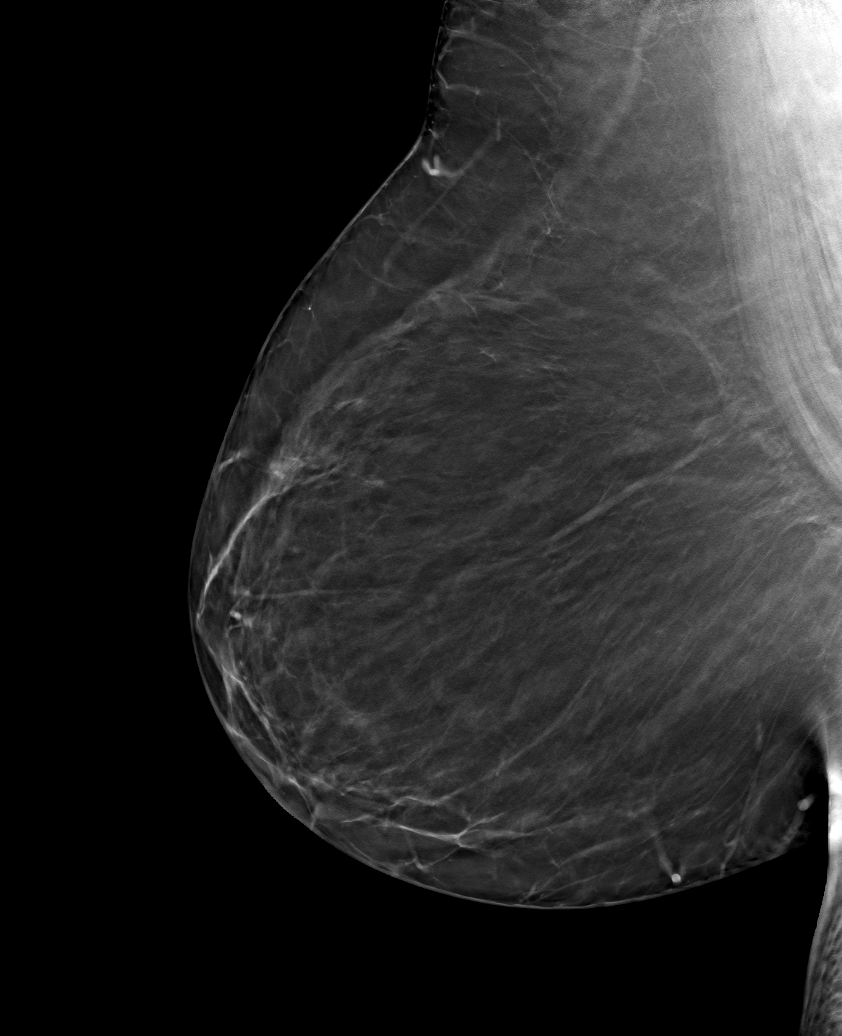

[6 of 30 positions shown; findings below may reference images not displayed]

ACR Breast Density Category b: There are scattered areas of
fibroglandular density.
FINDINGS: There are no findings suspicious for malignancy.
IMPRESSION: No mammographic evidence of malignancy. A result letter of this
screening mammogram will be mailed directly to the patient.

RECOMMENDATION:
Screening mammogram in one year. (Code:51-O-LD2)

BI-RADS CATEGORY  1: Negative.

## 2022-11-01 ENCOUNTER — Other Ambulatory Visit (HOSPITAL_BASED_OUTPATIENT_CLINIC_OR_DEPARTMENT_OTHER): Payer: Medicare HMO

## 2022-11-13 ENCOUNTER — Ambulatory Visit (HOSPITAL_BASED_OUTPATIENT_CLINIC_OR_DEPARTMENT_OTHER): Payer: Medicare HMO | Admitting: Pulmonary Disease

## 2022-11-20 ENCOUNTER — Ambulatory Visit (HOSPITAL_BASED_OUTPATIENT_CLINIC_OR_DEPARTMENT_OTHER): Payer: Medicare HMO

## 2022-11-23 ENCOUNTER — Ambulatory Visit (HOSPITAL_BASED_OUTPATIENT_CLINIC_OR_DEPARTMENT_OTHER): Payer: Medicare HMO | Admitting: Pulmonary Disease

## 2023-01-21 ENCOUNTER — Ambulatory Visit: Payer: Medicare HMO | Admitting: Neurology

## 2023-01-21 ENCOUNTER — Encounter: Payer: Self-pay | Admitting: Neurology

## 2023-01-21 VITALS — BP 108/65 | HR 71 | Ht 65.0 in | Wt 236.0 lb

## 2023-01-21 DIAGNOSIS — G4733 Obstructive sleep apnea (adult) (pediatric): Secondary | ICD-10-CM | POA: Diagnosis not present

## 2023-01-21 NOTE — Progress Notes (Signed)
Subjective:    Patient ID: Abigail Mendez is a 57 y.o. female.  HPI    Interim history:   Abigail Mendez is a 57 year old right-handed woman with an underlying complex medical history of hypertension, obesity, FMS, chronic pain, MDD, PTSD, asthma, sickle cell trait, IBS, arthritis, CTS, tremor and allergies, who presents for follow-up consultation of her obstructive sleep apnea, on CPAP therapy.  The patient unaccompanied today.  Abigail Mendez was last seen in sleep clinic in a video visit by Everlene Other, NP, on 07/19/2022, at which time Abigail Mendez was compliant with her AutoPap of 8 to 17 cm, AHI was borderline at 5.8 at the time.  Her AutoPap pressure was adjusted to 9 to 17 cm at the time.  We changed her pressure settings to a CPAP of 10 cm in May 2024.  Today, 01/21/2023: I reviewed her CPAP compliance data from 12/18/2022 through 01/16/2023, which is a total of 30 days, during which time Abigail Mendez used her machine every night with percent use days greater than 4 hours at 90%, indicating excellent compliance with an average usage of 6 hours and 9 minutes, residual AHI at goal at 1.2/h, leak acceptable with the 95th percentile at 10 L/min on a pressure of 10 cm with EPR of 3.  Abigail Mendez reports that Abigail Mendez has some trouble maintaining sleep.  Abigail Mendez has no trouble falling asleep, takes hydroxyzine 100 mg at bedtime.  Abigail Mendez reports waking up with mouth dryness and takes her mask off at the time.  Abigail Mendez puts it back on and sleeps with interruptions.  Abigail Mendez has a lot of medication, also recent changes.  Abigail Mendez is no longer on Myrbetriq for overactive bladder.  Abigail Mendez tries to hydrate well with water.  Abigail Mendez drinks about a total of 64 ounces of water per day.  Abigail Mendez has used a room humidifier but is not sure what her humidity setting is on her CPAP machine.  Abigail Mendez is willing to increase it.  Previously, when Abigail Mendez increased it, water was accumulating in the mask. The patient's allergies, current medications, family history, past medical history, past social  history, past surgical history and problem list were reviewed and updated as appropriate.   Previously:   03/06/22 (Video visit with MM): <<  Abigail Mendez is a 57 year old female with a history of OSA on CPAP. Abigail Mendez returns today for follow-up. Reports CPAP gets water in the mask. Has tried adjusting humidity. Download is below.   >> 05/01/21 (MM): << Abigail Mendez is a 57 year old female with a history of obstructive sleep apnea on CPAP.  Abigail Mendez returns today for CPAP follow-up.  Abigail Mendez reports that Abigail Mendez did receive a new machine.  Reports that Abigail Mendez does have a dry mouth.  But has not tried adjusting her humidity.  Abigail Mendez returns today for follow-up.  >> 04/28/20: (Abigail Mendez) reports needing supplies for his CPAP machine.  Abigail Mendez particularly needs the connector to the attaches to the mask and the hose.  Abigail Mendez is compliant with treatment but did not bring her machine or a card for download.  Abigail Mendez indicates good results and full compliance.   Abigail Mendez feels that the machine is working well, Abigail Mendez is not requesting a new machine.  Her DME company is [employer].  Abigail Mendez has been using the P 10 nasal pillows, size small with good success.     I had previously followed her for obstructive sleep apnea.  [facility] M[facility]   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  Abigail Mendez does not drink caffeine or alcohol and is a non-smoker.  Abigail Mendez lives alone. Bedtime is generally around 10 PM and rise time around 8 AM.     Abigail Mendez has not been seen in this clinic since September 2017.   Addendum, 05/04/20: I received her recent compliance data from the past 30 days between 04/02/2020 and 05/01/2020, Abigail Mendez used her machine 30 out of 30 days with percent use days greater than 4 hours at 100%, indicating superb compliance with an average usage of 8 hours and 13 minutes, residual AHI borderline at 6.2/h, leak on the higher side with a 95th percentile at 33 L/min on a pressure of 16 cm with EPR of 2.   11/30/2015: Abigail Mendez  reports that Abigail Mendez feels that the CPAP pressure needs to be adjusted and Abigail Mendez's not getting as much sleep as Abigail Mendez should. Of note, Abigail Mendez presented to the emergency room on 10/17/2015 with headache, Abigail Mendez was treated symptomatically and was advised to follow-up with Dr. Yetta Barre. Of note, Abigail Mendez also presented to the emergency room on 08/22/2015 for shaking spell. Abigail Mendez was found to have low potassium. Abigail Mendez was advised to follow-up with her primary care physician for potassium recheck in a few days. Of note, Abigail Mendez has gained weight since I last saw her, in the realm of 17 pounds. Abigail Mendez has lightheadedness and dizziness, has had 3 syncopal spells in 3 years, due for loop recorder placement tomorrow.      I last saw on 11/30/2014, at which time Abigail Mendez was doing okay, was compliant with treatment, Abigail Mendez had presented to the emergency room on 11/24/2014 with palpitations and subjective concern for A. fib. Her EKG was in keeping with sinus rhythm. I reviewed the emergency room records. Abigail Mendez was diagnosed with a urinary tract infection and was advised to monitor his symptoms and follow-up with cardiology for potential Holter monitor. Abigail Mendez sees a cardiologist in [facility]. Abigail Mendez is on generic Paxil 60 mg daily and no longer on vistaril. Abigail Mendez sees multiple specialists. Abigail Mendez sees pain management.   I reviewed her CPAP compliance data from 10/30/2015 through 11/28/2015 which is a total of 30 days, during which time Abigail Mendez used her machine 29 days with percent used days greater than 4 hours at 80%, indicating very good compliance with an average usage of 6 hours and 32 minutes, residual AHI borderline at 5.4 per hour, leak low for the 95th percentile at 5.4 L/m on a pressure of 15 cm with EPR of 2.     I saw her on 11/30/2013, at which time Abigail Mendez reported trying Vistaril 25 mg to 50 mg per psychiatrist. Abigail Mendez had been tried on Ambien which did not help and Abigail Mendez tried melatonin which also did not help. Abigail Mendez reported a fall in May 2000 2015 and this resulted  in a black eye. Abigail Mendez reported that Abigail Mendez went to [facility] and had a head CT which per her report was negative. Abigail Mendez was no longer on Effexor was on a higher dose of Abilify and had been started on lithium. Abigail Mendez reported full compliance with CPAP therapy indicating that it was helping her sleep and her daytime somnolence as well as sleep quality. Abigail Mendez still had nocturia about 4 times each night and reported weight gain.   I reviewed her CPAP compliance data from 10/29/2014 through 11/27/2014 which is a total of 30 days during which time Abigail Mendez used her machine 30 days with percent used days greater than 4  hours at 80%, indicating very good compliance with an average usage of 5 hours and 49 minutes, residual AHI high at 37.2 per hour, leak I with the 95th percentile at 30.4 L/m on a pressure of 12 cm with EPR of 2. Her residual AHI seems to be high secondary to obstructive apneic events.    I saw her on 05/27/2013, at which time Abigail Mendez was compliant with CPAP therapy but reported residual daytime symptoms including non-restorative sleep, sleep disruption and problems achieving and maintaining sleep. This had been a chronic problem. I talked to her about maintaining good sleep hygiene. I advised her against trying additional psychotropic or prescription sleep aids but suggested that Abigail Mendez could try melatonin.    I reviewed her compliance data from 09/01/2013 through 11/29/2013 which is the last 90 days during which time Abigail Mendez used her machine every night. Percent used days greater than 4 hours was 89%, indicating very good compliance. Average usage was 6 hours and 25 minutes. Residual AHI was low at 0.9 per hour, leak was acceptable at 15.5 L per minute at the 95th percentile. Abigail Mendez shares a letter with me that Abigail Mendez got from her insurance provider. Abigail Mendez says that Abigail Mendez may not be able to keep her CPAP machine as her insurance is no longer covering it. Abigail Mendez says that Abigail Mendez never had a face-to-face after her original  sleep study or before her original sleep study which was in January of last year. I first met her in October of last year after Abigail Mendez needed to switch sleep physicians after an insurance change, as understand. I have reviewed her sleep studies from January and February of last year and Abigail Mendez has always been compliant with treatment.    I first met her on 01/07/2013 at the request of her primary care physician. Abigail Mendez was diagnosed with OSA in 1/14. Abigail Mendez reported a change in her insurance coverage and need to change her DME company as well as her sleep specialist. I reviewed her sleep study from 04/17/2012 which showed a sleep efficiency of 70.9%. Sleep latency was 32 minutes and REM latency was increased at 306.5 minutes. Abigail Mendez had a decrease in rem sleep. Abigail Mendez had a total AHI of 20 per hour. Her baseline oxygen saturation was 98% while awake. Oxygen nadir was 77%. Time below 88% saturation was 4.6% of the study. Abigail Mendez had a CPAP titration study on 04/22/2012 and I reviewed the test results: Abigail Mendez was noted to have a sleep efficiency of 88.4% with a latency to sleep of 25 minutes and REM latency of 77.6 minutes. Abigail Mendez had an increased percentage of REM sleep. Baseline oxygen saturation while awake was 98%, lowest asleep oxygen saturation was 80%. Time below 88% saturation was 4.6% of the study. Her overall AHI was reduced at 3.2 per hour. Abigail Mendez had a mild increase in periodic leg movements at 7.6 per hour, with an associated arousal index of 0 per hour. Abigail Mendez was titrated on CPAP from 5-12 cm.   Abigail Mendez was placed on CPAP in 2/14 and I reviewed the patient's CPAP compliance data from 10/09/12 to 01/06/13 (90 days), during which time the patient used CPAP every day except for 2 days. The average usage for all days was 8 hours and 6 minutes. The percent used days greater than 4 hours was 93%, indicating excellent compliance. The residual AHI was 0.4 per hour, indicating an appropriate treatment pressure of 12 cwp with EPR of 3.   At the  time of her first appointment with  me Abigail Mendez indicated full compliance with CPAP but still had residual issues with nonrestorative sleep and daytime tiredness. Her ESS was 16/24. Abigail Mendez has tried improving her sleep hygiene. Abigail Mendez had gained wt on Lyrica and stopped it. Abigail Mendez also stopped her Wellbutrin, Zoloft, and Abilify on her own. Abigail Mendez has been taking hydrocodone and robaxin for FMS. Abigail Mendez has not yet seen a pain specialist, but is in the process of getting a referral. Abigail Mendez sees Dr. Corliss Skains in rheumatology. Abigail Mendez used to go to [facility], but stopped going there on 12/25/12. I suggested that Abigail Mendez continue using CPAP regularly. I also talked to her about potentially retesting her.   I reviewed her compliance data from 04/21/2013 through 05/26/2013 which is a total of 36 days during which time Abigail Mendez is CPAP every night except for one night. Percent used days greater than 4 hours was 86%, indicating very good compliance with an average usage of 6 hours and 28 minutes. Residual AHI is 1.7 per hour with very low leak. Pressure is 12 cm with EPR of 2.   Abigail Mendez had tried Ambien, which did not help her stay asleep, Abigail Mendez tried Melatonin 4.5 mg, which did not help. Abigail Mendez is on Clonidine for BP, anxiety and nightmares. Abigail Mendez is on Baclofen in lieu of Robaxin and is on Effexor XR 150 mg, which started in November 2014. Abigail Mendez was re-started on Abilify 10 mg qHS. Abigail Mendez has started seeing a pain Physician, Dr. Myna Hidalgo, in [place]. Abigail Mendez had been on Seroquel in the past, but gained a lot of weight.      Her Past Medical History Is Significant For: Past Medical History:  Diagnosis Date   Anemia    Anxiety    Asthma    CAD (coronary artery disease)    Chronic pain disorder    Chronic stable angina (HCC)    Colitis    Daytime somnolence 01/07/2013   Depression    Diabetes mellitus, type II (HCC)    Fibromyalgia    GERD (gastroesophageal reflux disease)    Hypertension    Hypothyroidism    Left kidney mass    MDD (major  depressive disorder) 01/07/2013   Obesity    OSA on CPAP 01/07/2013   Pinched nerve    PTSD (post-traumatic stress disorder)    Pulmonary scarring    Seasonal allergies    Sickle cell trait (HCC)    Sleep apnea    Vaginitis     Her Past Surgical History Is Significant For: Past Surgical History:  Procedure Laterality Date   NO PAST SURGERIES      Her Family History Is Significant For: Family History  Problem Relation Age of Onset   Breast cancer Sister 7   Bipolar disorder Sister    Suicidality Neg Hx    Sleep apnea Neg Hx     Her Social History Is Significant For: Social History   Socioeconomic History   Marital status: Single    Spouse name: Not on file   Number of children: Not on file   Years of education: Not on file   Highest education level: Not on file  Occupational History   Not on file  Tobacco Use   Smoking status: Never   Smokeless tobacco: Never  Substance and Sexual Activity   Alcohol use: No   Drug use: No   Sexual activity: Never    Birth control/protection: Abstinence  Other Topics Concern   Not on file  Social History Narrative  Not on file   Social Determinants of Health   Financial Resource Strain: Low Risk  (06/10/2022)   Received from [facility], [facility]   Overall Financial Resource Strain (CARDIA)    Difficulty of Paying Living Expenses: Not very hard  Recent Concern: Financial Resource Strain - Medium Risk (03/31/2022)   Received from [facility]   Overall Financial Resource Strain (CARDIA)    Difficulty of Paying Living Expenses: Somewhat hard  Food Insecurity: No Food Insecurity (06/10/2022)   Received from [facility], [facility]   Hunger Vital Sign    Worried About Running Out of Food in the Last Year: Never true    Ran Out of Food in the Last Year: Never true  Transportation Needs: Unmet Transportation Needs (06/10/2022)   Received from [facility], [facility]   PRAPARE - Transportation    Lack of  Transportation (Medical): Yes    Lack of Transportation (Non-Medical): No  Physical Activity: Unknown (06/10/2022)   Received from [facility]   Exercise Vital Sign    Days of Exercise per Week: 0 days    Minutes of Exercise per Session: Not on file  Recent Concern: Physical Activity - Inactive (06/10/2022)   Received from [facility]   Exercise Vital Sign    Days of Exercise per Week: 0 days    Minutes of Exercise per Session: 90 min  Stress: No Stress Concern Present (07/23/2022)   Received from [facility] - Occupational Stress Questionnaire    Feeling of Stress : Only a little  Recent Concern: Stress - Stress Concern Present (06/10/2022)   Received from [facility] - Occupational Stress Questionnaire    Feeling of Stress : To some extent  Social Connections: Socially Integrated (06/10/2022)   Received from [facility], [facility]   Social Network    How would you rate your social network (family, work, friends)?: Good participation with social networks  Recent Concern: Social Connections - Somewhat Isolated (03/31/2022)   Received from [facility]   Social Network    How would you rate your social network (family, work, friends)?: Restricted participation with some degree of social isolation    Her Allergies Are:  Allergies  Allergen Reactions   Benztropine Mesylate     Other reaction(s): Unknown   Fluticasone-Salmeterol Nausea Only and Other (See Comments)   Lactose     Other reaction(s): Unknown   Tizanidine Hcl Other (See Comments)    Other reaction(s): Unknown   Amlodipine Swelling    Other reaction(s): Unknown   Benztropine Nausea And Vomiting   Cyclobenzaprine     Hallucinations   Gabapentin     Other reaction(s): Unknown   Lactose Intolerance (Gi) Diarrhea   Lamotrigine     'fall', balance problems   Latex      Redness,swelling,itching, localized pain   Lisinopril     cough   Pregabalin Other (See Comments)   Topamax [Topiramate] Other (See Comments)    Burning in hands and feet   Tramadol Nausea And Vomiting   Trazodone Hcl Other (See Comments)    Other reaction(s): Unknown   Phenylalanine Rash   Sarilumab Itching  :   Her Current Medications Are:  Outpatient Encounter Medications as of 01/21/2023  Medication Sig   Acetaminophen (TYLENOL ARTHRITIS PAIN PO) Take by mouth.   acetaminophen (TYLENOL) 325 MG tablet Take by mouth.  albuterol (VENTOLIN HFA) 108 (90 Base) MCG/ACT inhaler INHALE 2 PUFFS INTO THE LUNGS EVERY 6 HOURS AS NEEDED FOR WHEEZING OR SHORTNESS OF BREATH   amoxicillin (AMOXIL) 500 MG capsule    ascorbic Acid (VITAMIN C) 500 MG CPCR Take by mouth.   aspirin 81 MG tablet Take 81 mg by mouth daily.   b complex vitamins tablet Take 1 tablet by mouth daily.   Biotin 1000 MCG tablet Take 1,000 mcg by mouth 3 (three) times daily.   busPIRone (BUSPAR) 10 MG tablet    CALCIUM PO Take by mouth.   carvedilol (COREG) 12.5 MG tablet Take 25 mg by mouth 4 (four) times daily. BP above 125 systolic   Cholecalciferol (VITAMIN D3) 1000 UNITS CAPS Take 2 tablets by mouth daily.   Cranberry (CRANBERRY CONCENTRATE) 500 MG CAPS    cyanocobalamin 100 MCG tablet Take by mouth.   ELDERBERRY-VITAMIN C-ZINC PO    famotidine (PEPCID) 10 MG tablet    felodipine (PLENDIL) 5 MG 24 hr tablet Take 5 mg by mouth daily.   fluticasone (FLONASE) 50 MCG/ACT nasal spray USE 1 SPRAY NASALLY EVERY DAY   folic acid (FOLVITE) 400 MCG tablet Take by mouth.   furosemide (LASIX) 20 MG tablet Take 20 mg by mouth 2 (two) times daily.   Garlic 1000 MG CAPS    Ginkgo Biloba 40 MG TABS Take 60 mg by mouth.   GINSENG KOREAN PO    glucose blood test strip 1 each by Other route as needed for other. Use as instructed   hydrALAZINE (APRESOLINE) 25 MG tablet Take by mouth 3 (three) times daily.   hypromellose (GENTEAL) 0.3  % GEL ophthalmic ointment    levothyroxine (SYNTHROID, LEVOTHROID) 50 MCG tablet Take 50 mcg by mouth daily.   losartan (COZAAR) 100 MG tablet Take 100 mg by mouth daily.   Magnesium (MAGNACAPS PO) Take by mouth.   Magnesium Oxide -Mg Supplement 250 MG TABS Take by mouth.   Multiple Vitamin (MULTIVITAMIN WITH MINERALS) TABS tablet Take 1 tablet by mouth daily.   oxyCODONE-acetaminophen (PERCOCET) 10-325 MG tablet Take 1 tablet by mouth every 6 (six) hours.   POTASSIUM CHLORIDE ER PO Take 10 mg by mouth.   Pyridoxine HCl (VITAMIN B-6 PO) Take by mouth.   Semaglutide (OZEMPIC, 0.25 OR 0.5 MG/DOSE, Goodman) 2 mg once a week.   Vilazodone HCl (VIIBRYD) 40 MG TABS Take by mouth daily.   amLODipine (NORVASC) 10 MG tablet Take by mouth.   atorvastatin (LIPITOR) 10 MG tablet Take 10 mg by mouth daily.   carvedilol (COREG) 6.25 MG tablet Take 6.25 mg by mouth 2 (two) times daily with a meal.   hydrOXYzine (VISTARIL) 25 MG capsule 50 mg at bedtime. 2 tablets at bedtime   leflunomide (ARAVA) 20 MG tablet Take 20 mg by mouth daily.   montelukast (SINGULAIR) 10 MG tablet Take 1 tablet (10 mg total) by mouth at bedtime.   MYRBETRIQ 25 MG TB24 tablet Take 25 mg by mouth daily.   No facility-administered encounter medications on file as of 01/21/2023.  :  Review of Systems:  Out of a complete 14 point review of systems, all are reviewed and negative with the exception of these symptoms as listed below:   Review of Systems  Neurological:        Pt here for CPAP f/u Pt states not sleeping well at night  Air pressure was changed in May 2024 to 9-17 cm Pt states no working well for her  Objective:  Neurological Exam  Physical Exam Physical Examination:   Vitals:   01/21/23 1124  BP: 108/65  Pulse: 71    General Examination: The patient is a very pleasant 57 y.o. female in no acute distress. Abigail Mendez appears well-developed and well-nourished and well groomed.  Abigail Mendez is in a clinic wheelchair.  HEENT:  Normocephalic, atraumatic, pupils are equal, round and reactive to light, corrective eyeglasses in place.  Abigail Mendez is wearing a protective cloth mask but removes it briefly for airway examination.  Face is symmetric, no voice tremor, no dysarthria, no hypophonia.  Airway examination reveals mild mouth dryness, tongue protrudes centrally and palate elevates symmetrically.  No carotid bruits.     Chest: Clear to auscultation without wheezing, rhonchi or crackles noted.   Heart: S1+S2+0, regular and normal without murmurs, rubs or gallops noted.    Abdomen: Soft, non-tender and non-distended.   Extremities: There is no obvious swelling around the ankles.  .      Neurologically:  Mental status: The patient is awake, alert and oriented. Cranial nerves are as described above under HEENT exam.  Motor exam: Normal bulk, moving all 4 extremities, I did not have her stand or walk for me today, in a wheelchair.  Fine motor skills are well preserved, grossly.  Sensory exam is intact to light touch.       Assessment and Plan:    In summary, KIRK SAMPLEY is a 57 year old right-handed woman with an underlying complex medical history of hypertension, obesity, FMS, chronic pain, MDD, PTSD, asthma, sickle cell trait, IBS, arthritis, CTS, tremor and allergies, who presents for follow-up consultation of her obstructive sleep apnea, on CPAP therapy.  Abigail Mendez is compliant with CPAP of 10 cm with good apnea control and leak from the mask acceptable.  Abigail Mendez is complaining of dry mouth, Abigail Mendez has some trouble maintaining sleep, no trouble falling asleep.  Abigail Mendez is encouraged to increase her humidity settings on her machine by one notch, hydrate well with water, and also continue to use a room humidifier for now.  Abigail Mendez is commended for treatment adherence.  Abigail Mendez is advised to follow-up in sleep clinic to see Butch Penny, NP in about a year from now, we can offer her a MyChart video visit if Abigail Mendez prefers.  I answered all her questions  today and Abigail Mendez was in agreement. I spent 20 minutes in total face-to-face time and in reviewing records during pre-charting, more than 50% of which was spent in counseling and coordination of care, reviewing test results, reviewing medications and treatment regimen and/or in discussing or reviewing the diagnosis of OSA, the prognosis and treatment options. Pertinent laboratory and imaging test results that were available during this visit with the patient were reviewed by me and considered in my medical decision making (see chart for details).

## 2023-01-21 NOTE — Patient Instructions (Signed)
It was nice to see you again today.  Your apnea scores are good on the current CPAP of 10 cm.  Your mask seal is also quite good.  As discussed, please increase the humidity setting on your machine by 1 notch, you can go higher if need be, also continue to use your room humidifier during the colder season.  Stay well-hydrated with water.  Follow-up in 1 year to see Megan.

## 2023-01-27 ENCOUNTER — Encounter (INDEPENDENT_AMBULATORY_CARE_PROVIDER_SITE_OTHER): Payer: Self-pay

## 2023-01-28 ENCOUNTER — Other Ambulatory Visit: Payer: Self-pay | Admitting: Medical Genetics

## 2023-01-28 DIAGNOSIS — Z006 Encounter for examination for normal comparison and control in clinical research program: Secondary | ICD-10-CM

## 2023-03-04 ENCOUNTER — Other Ambulatory Visit (HOSPITAL_COMMUNITY): Payer: Medicare PPO | Attending: Medical Genetics

## 2023-03-19 ENCOUNTER — Encounter: Payer: Self-pay | Admitting: Podiatry

## 2023-03-19 ENCOUNTER — Telehealth: Payer: Medicare HMO | Admitting: Adult Health

## 2023-03-19 ENCOUNTER — Ambulatory Visit: Payer: Medicare PPO | Admitting: Podiatry

## 2023-03-19 DIAGNOSIS — M79676 Pain in unspecified toe(s): Secondary | ICD-10-CM

## 2023-03-19 DIAGNOSIS — D2372 Other benign neoplasm of skin of left lower limb, including hip: Secondary | ICD-10-CM

## 2023-03-19 DIAGNOSIS — B351 Tinea unguium: Secondary | ICD-10-CM

## 2023-03-19 NOTE — Progress Notes (Signed)
 She presents today concerned that there is some thickening distal aspect of the hallux nails bilaterally.  States is starting to become problematic.  Objective: Vital signs stable alert oriented x 3 there is no erythema Dem salines drainage odor she has nail dystrophy distal aspect of the toenails with thickening and tenderness.  Assessment: Pain limb secondary onychomycosis hallux bilateral.  Plan: Debridement of toenails bilateral.

## 2023-05-16 ENCOUNTER — Encounter: Payer: Self-pay | Admitting: Neurology

## 2023-06-08 ENCOUNTER — Ambulatory Visit (HOSPITAL_COMMUNITY): Admission: EM | Admit: 2023-06-08 | Discharge: 2023-06-08 | Disposition: A | Discharge status: Home / Self Care

## 2023-06-08 ENCOUNTER — Encounter (HOSPITAL_COMMUNITY): Payer: Self-pay | Admitting: Emergency Medicine

## 2023-06-08 DIAGNOSIS — H5789 Other specified disorders of eye and adnexa: Secondary | ICD-10-CM

## 2023-06-08 DIAGNOSIS — H5713 Ocular pain, bilateral: Secondary | ICD-10-CM

## 2023-06-08 DIAGNOSIS — H53143 Visual discomfort, bilateral: Secondary | ICD-10-CM

## 2023-06-08 DIAGNOSIS — Z9889 Other specified postprocedural states: Secondary | ICD-10-CM

## 2023-06-08 NOTE — Discharge Instructions (Addendum)
 On-call eye specialist at [facility] - both you and I spoke to - recommended restart Prednisolone 3 times per day both eyes; follow-up with eye doctor on Monday  ER if acute / sudden worse / pain or loss of vision

## 2023-06-08 NOTE — ED Provider Notes (Signed)
 [facility]   MRN: [id number] DOB: 1965-06-17  Subjective:   Abigail Mendez is a 58 y.o. female presenting for acute onset bilateral eye pain and dryness. Woke her up at 4 am this morning.   Cataract surgery L eye 04/25/23; R eye cataract surgery 05/09/23; f/up appt on 06/05/23 - everything was cleared this day she says  Drove herself here today  9/10 pain R eye right now 6/10 initially, now 7/10 pain L eye, rapidly progressing Opening eyes makes pain much worse Very sensitive to the light Looking down with R eye is more painful  R eye is blurry, L eye is not blurry at this time  Sinus pressure bilateral Dull headache, but says not bothering her much   Using Ivizia drops the last few days; currently not on steroid eye drops  Hx of retinal detachment Mar 26, 2022 last year, emergency surgery  Hx of hole in cornea in the 1980s    No current facility-administered medications for this encounter.  Current Outpatient Medications:    albuterol (VENTOLIN HFA) 108 (90 Base) MCG/ACT inhaler, INHALE 2 PUFFS INTO THE LUNGS EVERY 6 HOURS AS NEEDED FOR WHEEZING OR SHORTNESS OF BREATH, Disp: 6.7 g, Rfl: 2   ascorbic Acid (VITAMIN C) 500 MG CPCR, Take by mouth., Disp: , Rfl:    aspirin 81 MG tablet, Take 81 mg by mouth daily., Disp: , Rfl:    b complex vitamins tablet, Take 1 tablet by mouth daily., Disp: , Rfl:    Biotin 1000 MCG tablet, Take 1,000 mcg by mouth 3 (three) times daily., Disp: , Rfl:    busPIRone (BUSPAR) 10 MG tablet, , Disp: , Rfl:    CALCIUM PO, Take by mouth., Disp: , Rfl:    Cholecalciferol (VITAMIN D3) 1000 UNITS CAPS, Take 2 tablets by mouth daily., Disp: , Rfl:    Cranberry (CRANBERRY CONCENTRATE) 500 MG CAPS, , Disp: , Rfl:    cyanocobalamin 100 MCG tablet, Take by mouth., Disp: , Rfl:    ELDERBERRY-VITAMIN C-ZINC PO, , Disp: , Rfl:    famotidine (PEPCID) 10 MG tablet, , Disp: , Rfl:    felodipine (PLENDIL) 5 MG 24 hr tablet, Take 5 mg by mouth  daily., Disp: , Rfl:    fluticasone (FLONASE) 50 MCG/ACT nasal spray, USE 1 SPRAY NASALLY EVERY DAY, Disp: , Rfl:    folic acid (FOLVITE) 400 MCG tablet, Take by mouth., Disp: , Rfl:    furosemide (LASIX) 20 MG tablet, Take 20 mg by mouth 2 (two) times daily., Disp: , Rfl:    Garlic 1000 MG CAPS, , Disp: , Rfl:    Ginkgo Biloba 40 MG TABS, Take 60 mg by mouth., Disp: , Rfl:    GINSENG KOREAN PO, , Disp: , Rfl:    glucose blood test strip, 1 each by Other route as needed for other. Use as instructed, Disp: , Rfl:    hydrALAZINE (APRESOLINE) 25 MG tablet, Take by mouth 3 (three) times daily., Disp: , Rfl:    levothyroxine (SYNTHROID, LEVOTHROID) 50 MCG tablet, Take 50 mcg by mouth daily., Disp: , Rfl:    losartan (COZAAR) 100 MG tablet, Take 100 mg by mouth daily., Disp: , Rfl:    Magnesium (MAGNACAPS PO), Take by mouth., Disp: , Rfl:    Magnesium Oxide -Mg Supplement 250 MG TABS, Take by mouth., Disp: , Rfl:    Multiple Vitamin (MULTIVITAMIN WITH MINERALS) TABS tablet, Take 1 tablet by mouth daily., Disp: , Rfl:  POTASSIUM CHLORIDE ER PO, Take 10 mg by mouth., Disp: , Rfl:    Povidone (IVIZIA DRY EYES OP), Apply to eye., Disp: , Rfl:    Pyridoxine HCl (VITAMIN B-6 PO), Take by mouth., Disp: , Rfl:    Semaglutide (OZEMPIC, 0.25 OR 0.5 MG/DOSE, [place]), 2 mg once a week., Disp: , Rfl:    Allergies  Allergen Reactions   Benztropine Mesylate     Other reaction(s): Unknown   Fluticasone-Salmeterol Nausea Only and Other (See Comments)   Lactose     Other reaction(s): Unknown   Tizanidine Hcl Other (See Comments)    Other reaction(s): Unknown   Amlodipine Swelling    Other reaction(s): Unknown   Benztropine Nausea And Vomiting   Cyclobenzaprine     Hallucinations   Gabapentin     Other reaction(s): Unknown   Lactose Intolerance (Gi) Diarrhea   Lamotrigine     'fall', balance problems   Latex     Redness,swelling,itching, localized pain   Lisinopril     cough   Pregabalin Other (See  Comments)   Topamax [Topiramate] Other (See Comments)    Burning in hands and feet   Tramadol Nausea And Vomiting   Trazodone Hcl Other (See Comments)    Other reaction(s): Unknown   Phenylalanine Rash   Sarilumab Itching    Past Medical History:  Diagnosis Date   Anemia    Anxiety    Asthma    CAD (coronary artery disease)    Chronic pain disorder    Chronic stable angina (HCC)    Colitis    Daytime somnolence 01/07/2013   Depression    Diabetes mellitus, type II (HCC)    Fibromyalgia    GERD (gastroesophageal reflux disease)    Hypertension    Hypothyroidism    Left kidney mass    MDD (major depressive disorder) 01/07/2013   Obesity    OSA on CPAP 01/07/2013   Pinched nerve    PTSD (post-traumatic stress disorder)    Pulmonary scarring    Seasonal allergies    Sickle cell trait (HCC)    Sleep apnea    Vaginitis      Past Surgical History:  Procedure Laterality Date   NO PAST SURGERIES      Family History  Problem Relation Age of Onset   Breast cancer Sister 44   Bipolar disorder Sister    Suicidality Neg Hx    Sleep apnea Neg Hx     Social History   Tobacco Use   Smoking status: Never   Smokeless tobacco: Never  Vaping Use   Vaping status: Never Used  Substance Use Topics   Alcohol use: No   Drug use: No    ROS REFER TO HPI FOR PERTINENT POSITIVES AND NEGATIVES   Objective:   Vitals: BP (!) 160/95 (BP Location: Left Arm)   Pulse 76   Temp 99.3 F (37.4 C) (Oral)   Resp 18   Ht 5\' 5"  (1.651 m)   Wt 219 lb (99.3 kg)   LMP 03/25/2014   SpO2 97%   BMI 36.44 kg/m   Physical Exam Vitals and nursing note reviewed.  Constitutional:      Appearance: Normal appearance.  HENT:     Mouth/Throat:     Mouth: Mucous membranes are moist.  Eyes:     General: Lids are normal. No allergic shiner, visual field deficit or scleral icterus.       Right eye: Discharge present. No foreign body or hordeolum.  Left eye: Discharge present.No  foreign body or hordeolum.     Extraocular Movements: Extraocular movements intact.     Conjunctiva/sclera:     Right eye: Right conjunctiva is injected.     Left eye: Left conjunctiva is not injected.     Comments: C/o painful EOM's bilaterally  Clear discharge bilaterally   Cardiovascular:     Rate and Rhythm: Normal rate and regular rhythm.  Pulmonary:     Effort: Pulmonary effort is normal.     Breath sounds: Normal breath sounds.  Skin:    General: Skin is warm.     Findings: No rash.  Neurological:     General: No focal deficit present.     Mental Status: She is alert and oriented to person, place, and time.     Cranial Nerves: No cranial nerve deficit.     Sensory: No sensory deficit.     Motor: No weakness.     Gait: Gait normal.  Psychiatric:        Mood and Affect: Mood normal.        Behavior: Behavior normal.        Visual Acuity  Right Eye Distance: 20 100 Left Eye Distance: 20 40 Bilateral Distance: 20 50    No results found for this or any previous visit (from the past 24 hours).  Assessment and Plan :   PDMP not reviewed this encounter.  1. Eye pain, bilateral   2. History of eye surgery   3. Eye redness   4. Photophobia of both eyes    57 yo female with recent history of bilateral cataract surgery, HTN, CKD, T2DM presenting with eye symptoms as described in the HPI.  Both patient and I were able to talk with the on-call eye specialist at [facility].  It was recommended to her that she restart on prednisolone eyedrops one drop 3 times a day in both eyes. She will follow up with them on Monday. She has sunglasses and feels safe to drive herself home at this time. She will call our urgent care when she gets home to let us know she made it safely. Very strict ER precautions discussed with her, pt agreeable and understanding of plan.     AllwardtCrist Infante, PA-C 06/08/23 1208

## 2023-06-08 NOTE — ED Triage Notes (Signed)
 Patient states that she started having some right eye dryness (scratchy feeling) last night.  This morning the left eye is feeling really dry.  Patient recently had cataract surgery left eye on 04/25/2023 then the right eye on 05/09/2023.  Had a f/u visit on 06/05/2023 and everything was cleared.  No injury or anything last night.  Vision is sensitive to light this morning no blurriness.

## 2023-06-21 HISTORY — PX: HERNIA REPAIR: SHX51

## 2023-09-11 ENCOUNTER — Other Ambulatory Visit

## 2023-09-11 DIAGNOSIS — Z006 Encounter for examination for normal comparison and control in clinical research program: Secondary | ICD-10-CM

## 2023-09-20 LAB — GENECONNECT MOLECULAR SCREEN: Genetic Analysis Overall Interpretation: NEGATIVE

## 2023-09-30 NOTE — Progress Notes (Signed)
 Patient Name: Abigail Mendez MR#: [id number]   Subjective:   Interval History (09/30/2023): Abigail Mendez returns for reevaluation of her left arm pain.  The injection we gave from the shoulder helped with her shoulder pain only and only for a couple of weeks.  She continues to have pain radiating down her arm as well as tingling in her hand.  She states that she did have a nerve conduction study earlier this year which revealed a pinched nerve but unfortunately we do not have these results available to us .  HPI (09/10/2023):  History of Present Illness The patient is a 58 year old female who presents for evaluation of left shoulder pain.  She is left-handed and has been experiencing severe, constant pain in her left shoulder, rated as 10 out of 10, since 2019. The pain initially localized to the shoulder, has progressively radiated to the elbow and wrist. She reports instability in her shoulder, describing it as slipping, jumping, and popping even without movement. She also mentions the presence of knots in the area. She has not undergone any physical therapy for her shoulder. She is considering shoulder replacement surgery due to the severity of her symptoms. She is able to perform certain tasks with her right hand but notes that her balance is compromised. She is hesitant about shoulder replacement surgery due to concerns about recovery and living alone. She had an ultrasound that revealed a bilateral rotator cuff tear. She sought treatment from a doctor who conducted x-rays but did not pursue further intervention. Despite three consultations with this physician, no additional diagnostic tests or treatments were initiated. She received steroid injections for her shoulder in 2006, which provided temporary relief. Her last consultation with Dr. Mylinda was in 2022 or March 2023.  She has rheumatoid arthritis and was seeing Dr. Derrek Ringer at [facility], who transferred her  to rheumatology at [facility] [place]. She has an appointment next week. She has tried several medications for her rheumatoid arthritis, including leflunomide, sulfasalazine, methotrexate, Humira, Rinvoq, Orencia, and azathioprine. Methotrexate did not help, and sulfasalazine caused excessive sweating. Leflunomide with Humira and Rinvoq worked but had to be stopped due to CAD. Orencia was effective but had to be discontinued due to a kidney mass. Azathioprine caused extreme muscle fatigue and made her muscles very easy to sprain, pull, and strain.  She has severe carpal tunnel syndrome in one hand and neuropathy in the other hand.  She has fibromyalgia, osteoarthritis, chronic fatigue syndrome, and chronic pain syndrome.  Supplemental Information She had surgery with Dr. Kuos for trigger finger in her middle finger on the right hand, thumb on the left hand, index finger, and pinkie finger on the left hand.  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  She is relying on a walker for ambulation.  She has pain significantly in the shoulder but also that radiates to the elbow and wrist.  She has rheumatoid arthritis which has been on numerous different disease modifying agents in the past.  She has kidney disease.  She tells me that she has severe carpal tunnel in this extremity as well as chronic pain syndrome and fibromyalgia.  BMI Readings from Last 1 Encounters:  07/09/23 35.76 kg/m   Objective:  LHD Physical Exam:  Left shoulder: Skin intact, no  lesions No significant atrophy or asymmetry PROM R L  Flexion 160 100  ER 60 0  IR T10 T10  Strength R L  Abd 5 4  ER 5 5  IR 5 5   SILT Ax/rad/med/ulnar + motor grip/EPL/IO Hand WWP, palpable radial  pulse  Diagnostic Studies:   X-rays of the left shoulder reviewed and interpreted, demonstrating loss of glenohumeral joint space with advanced degenerative changes  Assessment:  58 y.o. female with left shoulder osteoarthritis  Medical Decision Making:  She does have notable degenerative changes with osteoarthritis as well as functional deficits, pain and range of motion limitations as a result.  In this light, she would be candidate for reverse shoulder arthroplasty.  She does have some social factors which are limiting for her potential rehab including that she lives by herself and is reliant on a walker for ambulation.  She also has significant other sources of pain in this extremity.  I discussed with her that I am happy to manage her with injections for her shoulder pain.  We could consider reverse total shoulder arthroplasty with the understanding that we would need a very clear plan for social support afterwards and also the expectations that this would only help with her shoulder pain and not the elbow wrist or hand pain that she is having in the extremity.  She was actually quite understanding of this and grateful for the information.  She is think about it and talk to her friends and return for further discussion when needed.  Above discussion left for completeness from previous visit.  Her symptoms are now more radicular in nature.  She had a pinched nerve on a recent nerve conduction study.  I recommend follow-up with pain management for consideration of epidural steroid injection and I recommend her being able to bring the results of her recent nerve conduction study to this appointment when she has it.  Plan of care:   Referral to pain management for consideration of cervical epidural steroid injection, bring results of recent nerve conduction study to your visit please Follow up: When needed for left shoulder osteoarthritis

## 2023-10-04 ENCOUNTER — Emergency Department (HOSPITAL_BASED_OUTPATIENT_CLINIC_OR_DEPARTMENT_OTHER): Admitting: Radiology

## 2023-10-04 ENCOUNTER — Other Ambulatory Visit: Payer: Self-pay

## 2023-10-04 ENCOUNTER — Observation Stay (HOSPITAL_BASED_OUTPATIENT_CLINIC_OR_DEPARTMENT_OTHER)
Admission: EM | Admit: 2023-10-04 | Discharge: 2023-10-05 | Disposition: A | Discharge status: Home / Self Care | Attending: Hospitalist | Admitting: Hospitalist

## 2023-10-04 DIAGNOSIS — R079 Chest pain, unspecified: Secondary | ICD-10-CM | POA: Diagnosis present

## 2023-10-04 DIAGNOSIS — I129 Hypertensive chronic kidney disease with stage 1 through stage 4 chronic kidney disease, or unspecified chronic kidney disease: Secondary | ICD-10-CM | POA: Insufficient documentation

## 2023-10-04 DIAGNOSIS — I10 Essential (primary) hypertension: Secondary | ICD-10-CM | POA: Diagnosis present

## 2023-10-04 DIAGNOSIS — R110 Nausea: Secondary | ICD-10-CM | POA: Diagnosis not present

## 2023-10-04 DIAGNOSIS — M797 Fibromyalgia: Secondary | ICD-10-CM | POA: Diagnosis not present

## 2023-10-04 DIAGNOSIS — R7989 Other specified abnormal findings of blood chemistry: Principal | ICD-10-CM

## 2023-10-04 DIAGNOSIS — F4310 Post-traumatic stress disorder, unspecified: Secondary | ICD-10-CM | POA: Diagnosis present

## 2023-10-04 DIAGNOSIS — R42 Dizziness and giddiness: Secondary | ICD-10-CM

## 2023-10-04 DIAGNOSIS — R531 Weakness: Secondary | ICD-10-CM | POA: Diagnosis not present

## 2023-10-04 DIAGNOSIS — F329 Major depressive disorder, single episode, unspecified: Secondary | ICD-10-CM | POA: Diagnosis present

## 2023-10-04 DIAGNOSIS — E1122 Type 2 diabetes mellitus with diabetic chronic kidney disease: Secondary | ICD-10-CM | POA: Insufficient documentation

## 2023-10-04 DIAGNOSIS — Z6841 Body Mass Index (BMI) 40.0 and over, adult: Secondary | ICD-10-CM | POA: Diagnosis not present

## 2023-10-04 DIAGNOSIS — N183 Chronic kidney disease, stage 3 unspecified: Secondary | ICD-10-CM | POA: Diagnosis present

## 2023-10-04 DIAGNOSIS — E876 Hypokalemia: Secondary | ICD-10-CM | POA: Insufficient documentation

## 2023-10-04 DIAGNOSIS — K08409 Partial loss of teeth, unspecified cause, unspecified class: Secondary | ICD-10-CM

## 2023-10-04 DIAGNOSIS — K219 Gastro-esophageal reflux disease without esophagitis: Secondary | ICD-10-CM | POA: Diagnosis present

## 2023-10-04 DIAGNOSIS — J45909 Unspecified asthma, uncomplicated: Secondary | ICD-10-CM | POA: Insufficient documentation

## 2023-10-04 DIAGNOSIS — Z7982 Long term (current) use of aspirin: Secondary | ICD-10-CM | POA: Insufficient documentation

## 2023-10-04 DIAGNOSIS — G4733 Obstructive sleep apnea (adult) (pediatric): Secondary | ICD-10-CM | POA: Diagnosis not present

## 2023-10-04 DIAGNOSIS — Z743 Need for continuous supervision: Secondary | ICD-10-CM | POA: Diagnosis not present

## 2023-10-04 DIAGNOSIS — I259 Chronic ischemic heart disease, unspecified: Secondary | ICD-10-CM | POA: Diagnosis not present

## 2023-10-04 DIAGNOSIS — R112 Nausea with vomiting, unspecified: Secondary | ICD-10-CM | POA: Diagnosis present

## 2023-10-04 DIAGNOSIS — E66813 Obesity, class 3: Secondary | ICD-10-CM | POA: Insufficient documentation

## 2023-10-04 DIAGNOSIS — N1831 Chronic kidney disease, stage 3a: Secondary | ICD-10-CM

## 2023-10-04 LAB — CBC
HCT: 33.3 % — ABNORMAL LOW (ref 36.0–46.0)
Hemoglobin: 11.2 g/dL — ABNORMAL LOW (ref 12.0–15.0)
MCH: 27.7 pg (ref 26.0–34.0)
MCHC: 33.6 g/dL (ref 30.0–36.0)
MCV: 82.2 fL (ref 80.0–100.0)
Platelets: 260 K/uL (ref 150–400)
RBC: 4.05 MIL/uL (ref 3.87–5.11)
RDW: 13.7 % (ref 11.5–15.5)
WBC: 5.7 K/uL (ref 4.0–10.5)
nRBC: 0 % (ref 0.0–0.2)

## 2023-10-04 LAB — CBC WITH DIFFERENTIAL/PLATELET
Abs Immature Granulocytes: 0.01 K/uL (ref 0.00–0.07)
Basophils Absolute: 0 K/uL (ref 0.0–0.1)
Basophils Relative: 0 %
Eosinophils Absolute: 0.2 K/uL (ref 0.0–0.5)
Eosinophils Relative: 4 %
HCT: 36.4 % (ref 36.0–46.0)
Hemoglobin: 12.1 g/dL (ref 12.0–15.0)
Immature Granulocytes: 0 %
Lymphocytes Relative: 42 %
Lymphs Abs: 2.4 K/uL (ref 0.7–4.0)
MCH: 27.3 pg (ref 26.0–34.0)
MCHC: 33.2 g/dL (ref 30.0–36.0)
MCV: 82.2 fL (ref 80.0–100.0)
Monocytes Absolute: 0.9 K/uL (ref 0.1–1.0)
Monocytes Relative: 15 %
Neutro Abs: 2.3 K/uL (ref 1.7–7.7)
Neutrophils Relative %: 39 %
Platelets: 285 K/uL (ref 150–400)
RBC: 4.43 MIL/uL (ref 3.87–5.11)
RDW: 14 % (ref 11.5–15.5)
WBC: 5.8 K/uL (ref 4.0–10.5)
nRBC: 0 % (ref 0.0–0.2)

## 2023-10-04 LAB — COMPREHENSIVE METABOLIC PANEL WITH GFR
ALT: 16 U/L (ref 0–44)
AST: 24 U/L (ref 15–41)
Albumin: 4.4 g/dL (ref 3.5–5.0)
Alkaline Phosphatase: 85 U/L (ref 38–126)
Anion gap: 13 (ref 5–15)
BUN: 10 mg/dL (ref 6–20)
CO2: 26 mmol/L (ref 22–32)
Calcium: 10.6 mg/dL — ABNORMAL HIGH (ref 8.9–10.3)
Chloride: 102 mmol/L (ref 98–111)
Creatinine, Ser: 1.27 mg/dL — ABNORMAL HIGH (ref 0.44–1.00)
GFR, Estimated: 48 mL/min — ABNORMAL LOW (ref >60)
Glucose, Bld: 91 mg/dL (ref 70–99)
Potassium: 3.2 mmol/L — ABNORMAL LOW (ref 3.5–5.1)
Sodium: 141 mmol/L (ref 135–145)
Total Bilirubin: 0.6 mg/dL (ref 0.0–1.2)
Total Protein: 8 g/dL (ref 6.5–8.1)

## 2023-10-04 LAB — CREATININE, SERUM
Creatinine, Ser: 1.15 mg/dL — ABNORMAL HIGH (ref 0.44–1.00)
GFR, Estimated: 55 mL/min — ABNORMAL LOW (ref >60)

## 2023-10-04 LAB — LACTIC ACID, PLASMA: Lactic Acid, Venous: 1.2 mmol/L (ref 0.5–1.9)

## 2023-10-04 LAB — TROPONIN T, HIGH SENSITIVITY
Troponin T High Sensitivity: 34 ng/L — ABNORMAL HIGH (ref <19)
Troponin T High Sensitivity: 28 ng/L — ABNORMAL HIGH (ref <19)

## 2023-10-04 LAB — HEMOGLOBIN A1C
Hgb A1c MFr Bld: 5.1 % (ref 4.8–5.6)
Mean Plasma Glucose: 99.67 mg/dL

## 2023-10-04 LAB — TROPONIN I (HIGH SENSITIVITY): Troponin I (High Sensitivity): 22 ng/L — ABNORMAL HIGH (ref <18)

## 2023-10-04 LAB — GLUCOSE, CAPILLARY: Glucose-Capillary: 110 mg/dL — ABNORMAL HIGH (ref 70–99)

## 2023-10-04 MED ORDER — FAMOTIDINE 20 MG PO TABS
20.0000 mg | ORAL_TABLET | Freq: Two times a day (BID) | ORAL | Status: DC
  Administered 2023-10-04 – 2023-10-05 (×2): 20 mg via ORAL
  Filled 2023-10-04 (×2): qty 1

## 2023-10-04 MED ORDER — FUROSEMIDE 20 MG PO TABS
20.0000 mg | ORAL_TABLET | Freq: Two times a day (BID) | ORAL | Status: DC
  Administered 2023-10-05 (×2): 20 mg via ORAL
  Filled 2023-10-04 (×2): qty 1

## 2023-10-04 MED ORDER — LACTATED RINGERS IV BOLUS
1000.0000 mL | Freq: Once | INTRAVENOUS | Status: AC
  Administered 2023-10-04: 1000 mL via INTRAVENOUS

## 2023-10-04 MED ORDER — FELODIPINE ER 5 MG PO TB24
5.0000 mg | ORAL_TABLET | Freq: Every day | ORAL | Status: DC
  Administered 2023-10-05: 5 mg via ORAL
  Filled 2023-10-04 (×2): qty 1

## 2023-10-04 MED ORDER — ALBUTEROL SULFATE (2.5 MG/3ML) 0.083% IN NEBU: 2.5000 mg | INHALATION_SOLUTION | Freq: Four times a day (QID) | RESPIRATORY_TRACT | Status: DC | PRN

## 2023-10-04 MED ORDER — AMOXICILLIN-POT CLAVULANATE 875-125 MG PO TABS
1.0000 | ORAL_TABLET | Freq: Once | ORAL | Status: AC
  Administered 2023-10-04: 1 via ORAL
  Filled 2023-10-04: qty 1

## 2023-10-04 MED ORDER — CARVEDILOL 25 MG PO TABS
25.0000 mg | ORAL_TABLET | Freq: Two times a day (BID) | ORAL | Status: DC
  Administered 2023-10-04 – 2023-10-05 (×2): 25 mg via ORAL
  Filled 2023-10-04 (×2): qty 1

## 2023-10-04 MED ORDER — ASPIRIN 81 MG PO CHEW
324.0000 mg | CHEWABLE_TABLET | Freq: Once | ORAL | Status: AC
  Administered 2023-10-04: 324 mg via ORAL
  Filled 2023-10-04: qty 4

## 2023-10-04 MED ORDER — BIOTIN 1000 MCG PO TABS: 1000.0000 ug | ORAL_TABLET | Freq: Three times a day (TID) | ORAL | Status: DC

## 2023-10-04 MED ORDER — PREDNISOLONE ACETATE 1 % OP SUSP
1.0000 [drp] | Freq: Four times a day (QID) | OPHTHALMIC | Status: DC
  Administered 2023-10-04 – 2023-10-05 (×2): 1 [drp] via OPHTHALMIC
  Filled 2023-10-04: qty 5

## 2023-10-04 MED ORDER — ONDANSETRON HCL 4 MG/2ML IJ SOLN: 4.0000 mg | Freq: Four times a day (QID) | INTRAMUSCULAR | Status: DC | PRN

## 2023-10-04 MED ORDER — ACETAMINOPHEN 325 MG PO TABS
650.0000 mg | ORAL_TABLET | ORAL | Status: DC | PRN
  Administered 2023-10-05: 650 mg via ORAL
  Filled 2023-10-04 (×2): qty 2

## 2023-10-04 MED ORDER — BUPRENORPHINE 20 MCG/HR TD PTWK
1.0000 | MEDICATED_PATCH | TRANSDERMAL | Status: DC
  Filled 2023-10-04: qty 1

## 2023-10-04 MED ORDER — INSULIN ASPART 100 UNIT/ML IJ SOLN: 0.0000 [IU] | Freq: Three times a day (TID) | INTRAMUSCULAR | Status: DC

## 2023-10-04 MED ORDER — BUPRENORPHINE 10 MCG/HR TD PTWK
2.0000 | MEDICATED_PATCH | TRANSDERMAL | Status: DC
  Administered 2023-10-04: 2 via TRANSDERMAL
  Filled 2023-10-04: qty 2

## 2023-10-04 MED ORDER — HYDROCODONE-ACETAMINOPHEN 5-325 MG PO TABS
1.0000 | ORAL_TABLET | Freq: Four times a day (QID) | ORAL | Status: DC | PRN
  Administered 2023-10-05: 1 via ORAL
  Filled 2023-10-04: qty 1

## 2023-10-04 MED ORDER — FLUTICASONE PROPIONATE 50 MCG/ACT NA SUSP
1.0000 | Freq: Every day | NASAL | Status: DC
  Administered 2023-10-04 – 2023-10-05 (×2): 1 via NASAL
  Filled 2023-10-04: qty 16

## 2023-10-04 MED ORDER — LEVOTHYROXINE SODIUM 50 MCG PO TABS
50.0000 ug | ORAL_TABLET | ORAL | Status: DC
  Administered 2023-10-05: 50 ug via ORAL
  Filled 2023-10-04: qty 1

## 2023-10-04 MED ORDER — BUSPIRONE HCL 10 MG PO TABS
10.0000 mg | ORAL_TABLET | Freq: Two times a day (BID) | ORAL | Status: DC
  Administered 2023-10-04 – 2023-10-05 (×2): 10 mg via ORAL
  Filled 2023-10-04 (×2): qty 1

## 2023-10-04 MED ORDER — METHOCARBAMOL 500 MG PO TABS: 500.0000 mg | ORAL_TABLET | Freq: Two times a day (BID) | ORAL | Status: DC | PRN

## 2023-10-04 MED ORDER — TRIAMCINOLONE ACETONIDE 0.1 % EX CREA
1.0000 | TOPICAL_CREAM | Freq: Two times a day (BID) | CUTANEOUS | Status: DC
  Administered 2023-10-04: 1 via TOPICAL
  Filled 2023-10-04: qty 15

## 2023-10-04 MED ORDER — HYDRALAZINE HCL 25 MG PO TABS: 25.0000 mg | ORAL_TABLET | Freq: Three times a day (TID) | ORAL | Status: DC | PRN

## 2023-10-04 MED ORDER — ENOXAPARIN SODIUM 40 MG/0.4ML IJ SOSY
40.0000 mg | PREFILLED_SYRINGE | INTRAMUSCULAR | Status: DC
  Administered 2023-10-04 – 2023-10-05 (×2): 40 mg via SUBCUTANEOUS
  Filled 2023-10-04 (×2): qty 0.4

## 2023-10-04 MED ORDER — ASPIRIN 81 MG PO TBEC
81.0000 mg | DELAYED_RELEASE_TABLET | Freq: Every day | ORAL | Status: DC
  Administered 2023-10-05: 81 mg via ORAL
  Filled 2023-10-04: qty 1

## 2023-10-04 MED ORDER — LOSARTAN POTASSIUM 50 MG PO TABS
100.0000 mg | ORAL_TABLET | Freq: Every day | ORAL | Status: DC
  Administered 2023-10-05: 100 mg via ORAL
  Filled 2023-10-04: qty 2

## 2023-10-04 MED ORDER — VITAMIN B-6 25 MG PO TABS
25.0000 mg | ORAL_TABLET | Freq: Every day | ORAL | Status: DC
  Administered 2023-10-04 – 2023-10-05 (×2): 25 mg via ORAL
  Filled 2023-10-04 (×3): qty 1

## 2023-10-04 MED ORDER — INSULIN ASPART 100 UNIT/ML IJ SOLN: 0.0000 [IU] | Freq: Every day | INTRAMUSCULAR | Status: DC

## 2023-10-04 MED ORDER — ADULT MULTIVITAMIN W/MINERALS CH
1.0000 | ORAL_TABLET | Freq: Every day | ORAL | Status: DC
  Administered 2023-10-04 – 2023-10-05 (×2): 1 via ORAL
  Filled 2023-10-04 (×2): qty 1

## 2023-10-04 MED ORDER — ALBUTEROL SULFATE HFA 108 (90 BASE) MCG/ACT IN AERS: 2.0000 | INHALATION_SPRAY | Freq: Four times a day (QID) | RESPIRATORY_TRACT | Status: DC | PRN

## 2023-10-04 NOTE — ED Triage Notes (Signed)
 Pt caox4 c/o weakness and hypotension getting progressively worse since having dental procedure done on Wednesday. Pt states she is immunocompromised, not currently on abx.

## 2023-10-04 NOTE — ED Notes (Signed)
 Karna ren with cl called for consult

## 2023-10-04 NOTE — ED Notes (Signed)
 Pt states when she used the restroom, the collection bucket was not in the right place and no urine sample was collected.

## 2023-10-04 NOTE — H&P (Signed)
 History and Physical    Patient: Abigail Mendez FMW:[id number] DOB: 10/22/1965 DOA: 10/04/2023 DOS: the patient was seen and examined on 10/04/2023 PCP: Jacques Camie Pepper, PA-C  Patient coming from: Home  Chief Complaint:  Chief Complaint  Patient presents with   Weakness   HPI: ICESIS RENN is a 58 y.o. female with medical history significant of type 2 diabetes, coronary artery disease, GERD, hypothyroidism, essential hypertension, anxiety disorder, asthma, obstructive sleep apnea, PTSD, GERD, fibromyalgia, who presented to med [facility] with complaint of chest pain.  Patient had a dental procedure on Wednesday and since then she has been getting weak and became hypotensive.  She is also noted central chest pain.  She was not treated with any antibiotics for prophylaxis apparently.  Patient was seen and evaluated in the ER.  Initial workup including troponins were flat.  Patient was noted to have a potassium 3.2.  Creatinine is 1.27 chest x-ray showed no acute finding.  EKG showed normal sinus rhythm with a rate of 78.  No significant ST changes.  Patient has a calcium of 10.6.  Patient is being admitted to the medical service for rule out MI.  Initial troponin was 34 and 28.  Review of Systems: As mentioned in the history of present illness. All other systems reviewed and are negative. Past Medical History:  Diagnosis Date   Anemia    Anxiety    Asthma    CAD (coronary artery disease)    Chronic pain disorder    Chronic stable angina (HCC)    Colitis    Daytime somnolence 01/07/2013   Depression    Diabetes mellitus, type II (HCC)    Fibromyalgia    GERD (gastroesophageal reflux disease)    Hypertension    Hypothyroidism    Left kidney mass    MDD (major depressive disorder) 01/07/2013   Obesity    OSA on CPAP 01/07/2013   Pinched nerve    PTSD (post-traumatic stress disorder)    Pulmonary scarring    Seasonal allergies    Sickle cell trait (HCC)    Sleep apnea     Vaginitis    Past Surgical History:  Procedure Laterality Date   NO PAST SURGERIES     Social History:  reports that she has never smoked. She has never used smokeless tobacco. She reports that she does not drink alcohol and does not use drugs.  Allergies  Allergen Reactions   Benztropine Mesylate     Other reaction(s): Unknown   Fluticasone -Salmeterol Nausea Only and Other (See Comments)   Lactose     Other reaction(s): Unknown   Tizanidine Hcl Other (See Comments)    Other reaction(s): Unknown   Amlodipine Swelling    Other reaction(s): Unknown   Benztropine Nausea And Vomiting   Cyclobenzaprine     Hallucinations   Gabapentin     Other reaction(s): Unknown   Lactose Intolerance (Gi) Diarrhea   Lamotrigine     'fall', balance problems   Latex     Redness,swelling,itching, localized pain   Lisinopril     cough   Pregabalin Other (See Comments)   Topamax [Topiramate] Other (See Comments)    Burning in hands and feet   Tramadol Nausea And Vomiting   Trazodone Hcl Other (See Comments)    Other reaction(s): Unknown   Phenylalanine Rash   Sarilumab Itching    Family History  Problem Relation Age of Onset   Breast cancer Sister 60   Bipolar disorder  Sister    Suicidality Neg Hx    Sleep apnea Neg Hx     Prior to Admission medications   Medication Sig Start Date End Date Taking? Authorizing Provider  methocarbamol (ROBAXIN) 500 MG tablet Take 500-1,000 mg by mouth 2 (two) times daily as needed. 09/19/23  Yes [provider]  triamcinolone cream (KENALOG) 0.1 % Apply 1 Application topically 2 (two) times daily. 09/19/23  Yes [provider]  albuterol  (VENTOLIN  HFA) 108 (90 Base) MCG/ACT inhaler INHALE 2 PUFFS INTO THE LUNGS EVERY 6 HOURS AS NEEDED FOR WHEEZING OR SHORTNESS OF BREATH 02/28/22   Kassie Acquanetta Bradley, MD  ascorbic Acid (VITAMIN C) 500 MG CPCR Take by mouth. 01/17/22   [provider]  aspirin 81 MG tablet Take 81 mg by mouth daily.     [provider]  b complex vitamins tablet Take 1 tablet by mouth daily.    [provider]  Biotin 1000 MCG tablet Take 1,000 mcg by mouth 3 (three) times daily.    [provider]  buprenorphine (BUTRANS) 20 MCG/HR PTWK Place 1 patch onto the skin once a week.    [provider]  busPIRone (BUSPAR) 10 MG tablet  08/21/22   [provider]  CALCIUM PO Take by mouth.    [provider]  carvedilol (COREG) 25 MG tablet Take 25 mg by mouth 2 (two) times daily.    [provider]  Cholecalciferol (VITAMIN D3) 1000 UNITS CAPS Take 2 tablets by mouth daily. 06/18/11   [provider]  Cranberry (CRANBERRY CONCENTRATE) 500 MG CAPS  01/12/23   [provider]  cyanocobalamin 100 MCG tablet Take by mouth. 01/17/22   [provider]  ELDERBERRY-VITAMIN C-ZINC PO  05/23/21   [provider]  famotidine (PEPCID) 10 MG tablet  10/03/21   [provider]  felodipine (PLENDIL) 5 MG 24 hr tablet Take 5 mg by mouth daily. 12/18/22   [provider]  fluticasone  (FLONASE) 50 MCG/ACT nasal spray USE 1 SPRAY NASALLY EVERY DAY 09/05/22   [provider]  folic acid (FOLVITE) 400 MCG tablet Take by mouth.    [provider]  furosemide  (LASIX ) 20 MG tablet Take 20 mg by mouth 2 (two) times daily. 12/01/21   [provider]  Garlic 1000 MG CAPS  08/18/19   [provider]  Ginkgo Biloba 40 MG TABS Take 60 mg by mouth.    [provider]  AILEEN MYLAR PO  12/18/22   [provider]  glucose blood test strip 1 each by Other route as needed for other. Use as instructed    [provider]  hydrALAZINE (APRESOLINE) 25 MG tablet Take by mouth 3 (three) times daily. 04/02/22   [provider]  levothyroxine (SYNTHROID, LEVOTHROID) 50 MCG tablet Take 50 mcg by mouth daily. 06/23/12   [provider]  losartan (COZAAR) 100 MG tablet Take 100  mg by mouth daily.    [provider]  Magnesium (MAGNACAPS PO) Take by mouth.    [provider]  Magnesium Oxide -Mg Supplement 250 MG TABS Take by mouth. 03/05/22   [provider]  Multiple Vitamin (MULTIVITAMIN WITH MINERALS) TABS tablet Take 1 tablet by mouth daily.    [provider]  potassium chloride (KLOR-CON M) 10 MEQ tablet Take 10 mEq by mouth daily.    [provider]  POTASSIUM CHLORIDE ER PO Take 10 mg by mouth.    [provider]  Povidone (IVIZIA DRY EYES OP) Apply to eye.    [provider]  prednisoLONE acetate (PRED FORTE) 1 % ophthalmic suspension Place 1 drop into both eyes 4 (four) times daily.    [provider]  Pyridoxine HCl (VITAMIN B-6 PO) Take by mouth.    [provider]  Semaglutide (OZEMPIC, 0.25 OR 0.5 MG/DOSE, [place]) 2 mg once a week. 04/25/22   [provider]    Physical Exam: Vitals:   10/04/23 0920 10/04/23 1300 10/04/23 1500 10/04/23 1738  BP: (!) 130/58  133/73   Pulse: 66  67   Resp: 20  15   Temp: 98.6 F (37 C) 98.6 F (37 C)  98.4 F (36.9 C)  TempSrc: Oral Oral  Oral  SpO2: 100%  95%    Constitutional: Morbidly obese, no distress NAD, calm, comfortable Eyes: PERRL, lids and conjunctivae normal ENMT: Mucous membranes are moist. Posterior pharynx clear of any exudate or lesions.Normal dentition.  Neck: normal, supple, no masses, no thyromegaly Respiratory: clear to auscultation bilaterally, no wheezing, no crackles. Normal respiratory effort. No accessory muscle use.  Cardiovascular: Regular rate and rhythm, no murmurs / rubs / gallops. No extremity edema. 2+ pedal pulses. No carotid bruits.  Abdomen: no tenderness, no masses palpated. No hepatosplenomegaly. Bowel sounds positive.  Musculoskeletal: Good range of motion, no joint swelling or tenderness, Skin: no rashes, lesions, ulcers. No induration Neurologic: CN 2-12 grossly intact. Sensation intact,  DTR normal. Strength 5/5 in all 4.  Psychiatric: Normal judgment and insight. Alert and oriented x 3. Normal mood  Data Reviewed:  Temperature 98.6, blood pressure 130/58, pulse 67, white count 5.8 hemoglobin 12.1 and platelets 285.  Potassium 3.2 creatinine 1.27 and calcium 10.6.  Chest x-ray showed no acute findings.  EKG showed normal sinus rhythm with no significant ST changes  Assessment and Plan:  #1 chest pain: Enzymes is minimally elevated.  Patient will be admitted for rule out MI.  Will get echocardiogram and possibly nuclear medicine cardiac stress test in the morning.  Continue carvedilol aspirin and nitroglycerin.  Continue other supportive care.  Continue to cycle enzymes.  #2 essential hypertension: Continue with blood pressure medication.  #3 hypokalemia: Replete potassium  #4 morbid obesity: Dietary counseling  #5 history of asthma: No acute exacerbation.  Continue home regimen  #6 fibromyalgia: Patient on multiple multivitamins.  Would likely resume that.  #7 morbid obesity: Dietary counseling continue to monitor    Advance Care Planning:   Code Status: Not on file full code  Consults: None  Family Communication: No family at bedside  Severity of Illness: The appropriate patient status for this patient is OBSERVATION. Observation status is judged to be reasonable and necessary in order to provide the required intensity of service to ensure the patient's safety. The patient's presenting symptoms, physical exam findings, and initial radiographic and laboratory data in the context of their medical condition is felt to place them at decreased risk for further clinical deterioration. Furthermore, it is anticipated that the patient will be medically stable for discharge from the hospital within 2 midnights of admission.   AuthorBETHA SIM KNOLL, MD 10/04/2023 6:31 PM  For on call review [email or web address].

## 2023-10-04 NOTE — ED Provider Notes (Signed)
 [place] EMERGENCY DEPARTMENT AT [facility] Provider Note   CSN: [id number] Arrival date & time: 10/04/23  9087     Patient presents with: Weakness   Abigail Mendez is a 58 y.o. female.   HPI     58 year old female with history of type 2 diabetes, asthma, hypertension, hypothyroidism, OSA, renal mass, major depressive disorder, fibromyalgia, RA, PTSD, repair of ventral hernia in April, visit July 14 with orthopedics for left shoulder pain (since 2019), recent tooth extraction 7/16 who presents with concern for dizziness.  Reports that she had a tooth extraction on Wednesday.  Reports that after she returned home from the tooth extraction she initially could not detect her blood pressure.  She reports her blood pressure then improved and it was 113 systolic.  She felt dizzy, lightheaded like she was going to pass out.  She continued to feel this way on Thursday.  Today she checked her blood pressure and found it to be 89 systolic and Had the feeling of lightheadedness.  She reports last weekend she did have diarrhea, nausea and vomiting that resolved.  Reports that her appetite has been low over the last couple of days.  She is felt feverish but has been unable to check a temperature at home.  She is concerned as she is worried she is immunocompromise with her history of RA and was not placed on antibiotics with her tooth extraction.  She has had continued nausea.  Denies any cough, congestion, urinary symptoms, abdominal pain, chest pain.  Reports she has some chronic chest pain that is unchanged.  Reports she has chronic shoulder pain and needs her shoulder replaced and this is not changed or different.  Denies black or bloody stool.  Denies focal numbness, weakness, vision changes (notes some chronic double vision previously evaluated that is not changed), no change in speech, no headache.  Has difficulty ambulating due to lightheadedness.  Further sharp pain going to the left  today. Feeling weight in chest but not dyspnea. Like 5lb weight just sitting there. No palpitations this week.  Past Medical History:  Diagnosis Date   Anemia    Anxiety    Asthma    CAD (coronary artery disease)    Chronic pain disorder    Chronic stable angina (HCC)    Colitis    Daytime somnolence 01/07/2013   Depression    Diabetes mellitus, type II (HCC)    Fibromyalgia    GERD (gastroesophageal reflux disease)    Hypertension    Hypothyroidism    Left kidney mass    MDD (major depressive disorder) 01/07/2013   Obesity    OSA on CPAP 01/07/2013   Pinched nerve    PTSD (post-traumatic stress disorder)    Pulmonary scarring    Seasonal allergies    Sickle cell trait (HCC)    Sleep apnea    Vaginitis      Prior to Admission medications   Medication Sig Start Date End Date Taking? Authorizing Provider  acetaminophen  (TYLENOL ) 500 MG tablet Take 1,000 mg by mouth in the morning, at noon, and at bedtime.   Yes [provider]  albuterol  (VENTOLIN  HFA) 108 (90 Base) MCG/ACT inhaler INHALE 2 PUFFS INTO THE LUNGS EVERY 6 HOURS AS NEEDED FOR WHEEZING OR SHORTNESS OF BREATH 02/28/22  Yes Kassie Acquanetta Bradley, MD  ascorbic Acid (VITAMIN C) 500 MG CPCR Take by mouth. 01/17/22  Yes [provider]  aspirin 81 MG tablet Take 81 mg by mouth daily.  Yes [provider]  b complex vitamins tablet Take 1 tablet by mouth daily.   Yes [provider]  buprenorphine (BUTRANS) 20 MCG/HR PTWK Place 1 patch onto the skin once a week.   Yes [provider]  busPIRone (BUSPAR) 10 MG tablet Take 10 mg by mouth 2 (two) times daily. 08/21/22  Yes [provider]  CALCIUM PO Take by mouth.   Yes [provider]  carvedilol (COREG) 25 MG tablet Take 25 mg by mouth 2 (two) times daily.   Yes [provider]  Cranberry (CRANBERRY CONCENTRATE) 500 MG CAPS  01/12/23  Yes [provider]  cyanocobalamin 100 MCG tablet Take by  mouth. 01/17/22  Yes [provider]  famotidine (PEPCID) 10 MG tablet Take 10 mg by mouth daily. 10/03/21  Yes [provider]  felodipine (PLENDIL) 5 MG 24 hr tablet Take 5 mg by mouth daily. 12/18/22  Yes [provider]  fluticasone  (FLONASE) 50 MCG/ACT nasal spray USE 1 SPRAY NASALLY EVERY DAY 09/05/22  Yes [provider]  furosemide  (LASIX ) 20 MG tablet Take 20 mg by mouth 2 (two) times daily. 12/01/21  Yes [provider]  Garlic 1000 MG CAPS  08/18/19  Yes [provider]  Ginkgo Biloba 40 MG TABS Take 60 mg by mouth.   Yes [provider]  hydrALAZINE (APRESOLINE) 25 MG tablet Take 25 mg by mouth 3 (three) times daily. If BP >125 04/02/22  Yes [provider]  HYDROcodone -acetaminophen  (NORCO/VICODIN) 5-325 MG tablet Take 1 tablet by mouth every 6 (six) hours as needed for moderate pain (pain score 4-6).   Yes [provider]  levothyroxine (SYNTHROID, LEVOTHROID) 50 MCG tablet Take 50 mcg by mouth daily. 06/23/12  Yes [provider]  losartan (COZAAR) 100 MG tablet Take 100 mg by mouth at bedtime.   Yes [provider]  Magnesium (MAGNACAPS PO) Take by mouth.   Yes [provider]  methocarbamol (ROBAXIN) 500 MG tablet Take 500-1,000 mg by mouth 2 (two) times daily as needed for muscle spasms. 09/19/23  Yes [provider]  Multiple Vitamin (MULTIVITAMIN WITH MINERALS) TABS tablet Take 1 tablet by mouth daily.   Yes [provider]  potassium chloride (KLOR-CON M) 10 MEQ tablet Take 10 mEq by mouth daily.   Yes [provider]  Povidone (IVIZIA DRY EYES OP) Apply to eye.   Yes [provider]  prednisoLONE acetate (PRED FORTE) 1 % ophthalmic suspension Place 1 drop into both eyes 4 (four) times daily.   Yes [provider]  Semaglutide (OZEMPIC, 0.25 OR 0.5 MG/DOSE, [place]) Inject 2 mg into the skin once a week. 04/25/22  Yes [provider]   triamcinolone cream (KENALOG) 0.1 % Apply 1 Application topically 2 (two) times daily. 09/19/23  Yes [provider]  ELDERBERRY-VITAMIN C-ZINC PO  05/23/21   [provider]  folic acid (FOLVITE) 400 MCG tablet Take by mouth. Patient not taking: Reported on 10/04/2023    [provider]  glucose blood test strip 1 each by Other route as needed for other. Use as instructed    [provider]    Allergies: Benztropine mesylate, Fluticasone -salmeterol, Lactose, Tizanidine hcl, Amlodipine, Benztropine, Cyclobenzaprine, Gabapentin, Lactose intolerance (gi), Lamotrigine, Latex, Lisinopril, Pregabalin, Topamax [topiramate], Tramadol, Trazodone hcl, Phenylalanine, and Sarilumab    Review of Systems  Updated Vital Signs BP (!) 122/55 (BP Location: Left Arm)   Pulse 64   Temp 98.7 F (37.1 C) (Oral)  Resp 18   LMP 03/25/2014   SpO2 98%   Physical Exam Vitals and nursing note reviewed.  Constitutional:      General: She is not in acute distress.    Appearance: Normal appearance. She is well-developed. She is not ill-appearing or diaphoretic.  HENT:     Head: Normocephalic and atraumatic.  Eyes:     General: No visual field deficit.    Extraocular Movements: Extraocular movements intact.     Conjunctiva/sclera: Conjunctivae normal.     Pupils: Pupils are equal, round, and reactive to light.  Cardiovascular:     Rate and Rhythm: Normal rate and regular rhythm.     Pulses: Normal pulses.     Heart sounds: Normal heart sounds. No murmur heard.    No friction rub. No gallop.  Pulmonary:     Effort: Pulmonary effort is normal. No respiratory distress.     Breath sounds: Normal breath sounds. No wheezing or rales.  Abdominal:     General: There is no distension.     Palpations: Abdomen is soft.     Tenderness: There is no abdominal tenderness. There is no guarding.  Musculoskeletal:        General: No swelling or tenderness.     Cervical back: Normal  range of motion.  Skin:    General: Skin is warm and dry.     Findings: No erythema or rash.  Neurological:     General: No focal deficit present.     Mental Status: She is alert and oriented to person, place, and time.     GCS: GCS eye subscore is 4. GCS verbal subscore is 5. GCS motor subscore is 6.     Cranial Nerves: No cranial nerve deficit, dysarthria or facial asymmetry.     Sensory: No sensory deficit.     Motor: No weakness or tremor.     Coordination: Coordination normal. Finger-Nose-Finger Test normal.     (all labs ordered are listed, but only abnormal results are displayed) Labs Reviewed  COMPREHENSIVE METABOLIC PANEL WITH GFR - Abnormal; Notable for the following components:      Result Value   Potassium 3.2 (*)    Creatinine, Ser 1.27 (*)    Calcium 10.6 (*)    GFR, Estimated 48 (*)    All other components within normal limits  GLUCOSE, CAPILLARY - Abnormal; Notable for the following components:   Glucose-Capillary 110 (*)    All other components within normal limits  TROPONIN T, HIGH SENSITIVITY - Abnormal; Notable for the following components:   Troponin T High Sensitivity 34 (*)    All other components within normal limits  TROPONIN T, HIGH SENSITIVITY - Abnormal; Notable for the following components:   Troponin T High Sensitivity 28 (*)    All other components within normal limits  CULTURE, BLOOD (ROUTINE X 2)  CULTURE, BLOOD (ROUTINE X 2)  CBC WITH DIFFERENTIAL/PLATELET  LACTIC ACID, PLASMA  URINALYSIS, ROUTINE W REFLEX MICROSCOPIC  HIV ANTIBODY (ROUTINE TESTING W REFLEX)  CBC  CREATININE, SERUM  LIPID PANEL  HEMOGLOBIN A1C  TROPONIN I (HIGH SENSITIVITY)  TROPONIN I (HIGH SENSITIVITY)    EKG: EKG Interpretation Date/Time:  Friday October 04 2023 09:28:49 EDT Ventricular Rate:  66 PR Interval:  178 QRS Duration:  101 QT Interval:  410 QTC Calculation: 430 R Axis:   31  Text Interpretation: Sinus rhythm Borderline T abnormalities, anterior leads  Nonspecific changes anterior leads compared to prior Confirmed by Dreama Longs ([id number]) on  10/04/2023 9:50:07 AM  Radiology: [facility] Chest 2 View Result Date: 10/04/2023 CLINICAL DATA:  Lightheadedness. EXAM: CHEST - 2 VIEW COMPARISON:  March 30, 2022. FINDINGS: The heart size and mediastinal contours are within normal limits. Both lungs are clear. The visualized skeletal structures are unremarkable. IMPRESSION: No active cardiopulmonary disease. Electronically Signed   By: Lynwood Landy Raddle M.D.   On: 10/04/2023 10:10     Procedures   Medications Ordered in the ED  aspirin EC tablet 81 mg (has no administration in time range)  levothyroxine (SYNTHROID) tablet 50 mcg (has no administration in time range)  multivitamin with minerals tablet 1 tablet (has no administration in time range)  losartan (COZAAR) tablet 100 mg (has no administration in time range)  furosemide  (LASIX ) tablet 20 mg (has no administration in time range)  hydrALAZINE (APRESOLINE) tablet 25 mg (has no administration in time range)  felodipine (PLENDIL) 24 hr tablet 5 mg (has no administration in time range)  busPIRone (BUSPAR) tablet 10 mg (has no administration in time range)  famotidine (PEPCID) tablet 20 mg (has no administration in time range)  fluticasone  (FLONASE) 50 MCG/ACT nasal spray 1 spray (has no administration in time range)  carvedilol (COREG) tablet 25 mg (has no administration in time range)  methocarbamol (ROBAXIN) tablet 500-1,000 mg (has no administration in time range)  prednisoLONE acetate (PRED FORTE) 1 % ophthalmic suspension 1 drop (has no administration in time range)  triamcinolone cream (KENALOG) 0.1 % cream 1 Application (has no administration in time range)  pyridOXINE (VITAMIN B6) tablet 25 mg (has no administration in time range)  acetaminophen  (TYLENOL ) tablet 650 mg (has no administration in time range)  ondansetron  (ZOFRAN ) injection 4 mg (has no administration in time range)  enoxaparin  (LOVENOX) injection 40 mg (has no administration in time range)  albuterol  (PROVENTIL ) (2.5 MG/3ML) 0.083% nebulizer solution 2.5 mg (has no administration in time range)  insulin aspart (novoLOG) injection 0-20 Units (has no administration in time range)  insulin aspart (novoLOG) injection 0-5 Units ( Subcutaneous Not Given 10/04/23 2200)  buprenorphine (BUTRANS) 10 MCG/HR 2 patch (has no administration in time range)  lactated ringers bolus 1,000 mL (0 mLs Intravenous Stopped 10/04/23 1147)  amoxicillin -clavulanate (AUGMENTIN ) 875-125 MG per tablet 1 tablet (1 tablet Oral Given 10/04/23 1123)  aspirin chewable tablet 324 mg (324 mg Oral Given 10/04/23 1123)                                    58 year old female with history of type 2 diabetes, asthma, hypertension, hypothyroidism, OSA, renal mass, major depressive disorder, fibromyalgia, RA, PTSD, repair of ventral hernia in April, visit July 14 with orthopedics for left shoulder pain (since 2019), recent tooth extraction 7/16 who presents with concern for dizziness.  DDx includes arrhythmia, dehydration, sepsis, GI bleed, tamponade, ACS, intracranial etiology of dizziness.  Neurologic exam without acute abnormalities, symptoms described more pre-syncope than vertigo and have low suspicion for CVA, ICH. No dyspnea, doubt PE. Do not feel  hx and exam consistent with aortic dissection.  Describes blood pressures low at home in 80s after regularly taking her blood pressures with this blood pressure cuff over years.  Gratefully, her blood pressures appear improved here. Had illness last weekend, lower appetite and possible dehydration contributing to symptoms and will give IV fluids.   EKG completed and evaluated by me with normal sinus rhythm, tw inversions anterior leads new compared  to prior.  Labs completed and personally evaluated by me show no anemia, no significant electrolyte abnormality, no leukocytosis, similar creatinine. Lactic acid WNL, no  leukocytosis, doubt sepsis . Will treat dental pain with augmentin .    CXR without pneumonia, pneumothorax, edema.  Troponin elevated to 34, prior values 6 and 7.  Reports has chest pain chronically but has also had the last few days with the lightheadedness, given new TW changes, troponin elevated more than prior values and given this will admit for continued evaluation of chest pain, elevated troponin, lightheadedness.       Final diagnoses:  Elevated troponin  Chest pain, unspecified type  Dizziness  History of tooth extraction, unspecified edentulism class    ED Discharge Orders     None          Dreama Longs, MD 10/04/23 2222

## 2023-10-04 NOTE — ED Notes (Signed)
Abigail Mendez with cl called for transport 

## 2023-10-05 ENCOUNTER — Observation Stay (HOSPITAL_BASED_OUTPATIENT_CLINIC_OR_DEPARTMENT_OTHER)

## 2023-10-05 DIAGNOSIS — F4310 Post-traumatic stress disorder, unspecified: Secondary | ICD-10-CM | POA: Diagnosis not present

## 2023-10-05 DIAGNOSIS — R079 Chest pain, unspecified: Secondary | ICD-10-CM

## 2023-10-05 DIAGNOSIS — R072 Precordial pain: Secondary | ICD-10-CM

## 2023-10-05 DIAGNOSIS — F329 Major depressive disorder, single episode, unspecified: Secondary | ICD-10-CM | POA: Diagnosis not present

## 2023-10-05 DIAGNOSIS — G4733 Obstructive sleep apnea (adult) (pediatric): Secondary | ICD-10-CM | POA: Diagnosis not present

## 2023-10-05 DIAGNOSIS — I2089 Other forms of angina pectoris: Secondary | ICD-10-CM | POA: Diagnosis not present

## 2023-10-05 LAB — HIV ANTIBODY (ROUTINE TESTING W REFLEX): HIV Screen 4th Generation wRfx: NONREACTIVE

## 2023-10-05 LAB — LIPID PANEL
Cholesterol: 146 mg/dL (ref 0–200)
HDL: 40 mg/dL — ABNORMAL LOW (ref >40)
LDL Cholesterol: 94 mg/dL (ref 0–99)
Total CHOL/HDL Ratio: 3.7 ratio
Triglycerides: 58 mg/dL (ref <150)
VLDL: 12 mg/dL (ref 0–40)

## 2023-10-05 LAB — URINALYSIS, ROUTINE W REFLEX MICROSCOPIC
Bilirubin Urine: NEGATIVE
Glucose, UA: NEGATIVE mg/dL
Hgb urine dipstick: NEGATIVE
Ketones, ur: NEGATIVE mg/dL
Nitrite: NEGATIVE
Protein, ur: NEGATIVE mg/dL
Specific Gravity, Urine: 1.008 (ref 1.005–1.030)
pH: 6 (ref 5.0–8.0)

## 2023-10-05 LAB — GLUCOSE, CAPILLARY
Glucose-Capillary: 87 mg/dL (ref 70–99)
Glucose-Capillary: 90 mg/dL (ref 70–99)
Glucose-Capillary: 85 mg/dL (ref 70–99)

## 2023-10-05 LAB — ECHOCARDIOGRAM COMPLETE
AR max vel: 1.69 cm2
AV Peak grad: 9 mmHg
Ao pk vel: 1.5 m/s
Area-P 1/2: 3.65 cm2
S' Lateral: 2.7 cm
Weight: 3421.54 [oz_av]

## 2023-10-05 LAB — TROPONIN I (HIGH SENSITIVITY): Troponin I (High Sensitivity): 24 ng/L — ABNORMAL HIGH (ref <18)

## 2023-10-05 MED ORDER — AMOXICILLIN-POT CLAVULANATE 875-125 MG PO TABS
1.0000 | ORAL_TABLET | Freq: Two times a day (BID) | ORAL | Status: DC
  Administered 2023-10-05: 1 via ORAL
  Filled 2023-10-05 (×3): qty 1

## 2023-10-05 MED ORDER — PYRIDOXINE HCL 25 MG PO TABS: 25.0000 mg | ORAL_TABLET | Freq: Every day | ORAL | 0 refills | Status: AC

## 2023-10-05 MED ORDER — CHLORHEXIDINE GLUCONATE 0.12 % MT SOLN
15.0000 mL | Freq: Two times a day (BID) | OROMUCOSAL | Status: DC
  Administered 2023-10-05: 15 mL via OROMUCOSAL
  Filled 2023-10-05: qty 15

## 2023-10-05 MED ORDER — POLYVINYL ALCOHOL 1.4 % OP SOLN
1.0000 [drp] | OPHTHALMIC | Status: DC | PRN
  Administered 2023-10-05: 1 [drp] via OPHTHALMIC
  Filled 2023-10-05: qty 15

## 2023-10-05 MED ORDER — MAGIC MOUTHWASH: 10.0000 mL | Freq: Four times a day (QID) | ORAL | Status: DC | PRN

## 2023-10-05 MED ORDER — AMOXICILLIN-POT CLAVULANATE 875-125 MG PO TABS: 1.0000 | ORAL_TABLET | Freq: Two times a day (BID) | ORAL | 0 refills | Status: DC

## 2023-10-05 NOTE — Plan of Care (Signed)
  Problem: Nutrition: Goal: Adequate nutrition will be maintained Outcome: Completed/Met   Problem: Elimination: Goal: Will not experience complications related to urinary retention Outcome: Completed/Met   Problem: Pain Managment: Goal: General experience of comfort will improve and/or be controlled Outcome: Completed/Met   Problem: Skin Integrity: Goal: Risk for impaired skin integrity will decrease Outcome: Completed/Met   Problem: Nutritional: Goal: Maintenance of adequate nutrition will improve Outcome: Completed/Met

## 2023-10-05 NOTE — Hospital Course (Signed)
 Abigail Mendez is a 58 y.o. female with medical history significant of type 2 diabetes, coronary artery disease, GERD, hypothyroidism, essential hypertension, anxiety disorder, asthma, obstructive sleep apnea, PTSD, GERD, fibromyalgia, who presented to med [facility] with complaint of chest pain.  Patient had a dental procedure on Wednesday and since then she has been getting weak and became hypotensive.  She is also noted central chest pain.  She was not treated with any antibiotics for prophylaxis apparently.  Patient was seen and evaluated in the ER.  Initial workup including troponins were flat.  Patient was noted to have a potassium 3.2.  Creatinine is 1.27 chest x-ray showed no acute finding.  EKG showed normal sinus rhythm with a rate of 78.  No significant ST changes.  Patient has a calcium of 10.6.  Patient is being admitted to the medical service for rule out MI.  Initial troponin was 34 and 28.  Patient's chest pain subsided.  Troponins flat out.  Echocardiogram was done which did not show any wall motion abnormalities with normal ejection fraction.  Patient was being planned for a stress test on Monday.  Patient was given the option to wait until Monday and get the stress test done.  However patient chose to go home and do the outpatient stress test.  Patient is alert awake and oriented and she is able to make the decision.  She chose to get discharged home and follow-up with primary care provider as outpatient and cardiologist for possible outpatient stress test.  Patient will be discharged home on her home medications and Augmentin  for 3 to 4 days.

## 2023-10-05 NOTE — Progress Notes (Signed)
 Echocardiogram 2D Echocardiogram has been performed.  Thea Norlander 10/05/2023, 11:25 AM

## 2023-10-05 NOTE — Discharge Summary (Signed)
 Physician Discharge Summary   Patient: Abigail Mendez MRN: [id number] DOB: [birth year]  Admit date:     10/04/2023  Discharge date: 10/05/23  Discharge Physician: Nena Rebel   PCP: Jacques Camie Pepper, PA-C   Recommendations at discharge:   Follow-up with your primary care doctor and cardiologist for possible outpatient stress test Continue Augmentin , for post tooth extraction with ongoing pain  Discharge Diagnoses: Principal Problem:   Chest pain Active Problems:   OSA on CPAP   MDD (major depressive disorder)   PTSD (post-traumatic stress disorder)   GERD (gastroesophageal reflux disease)   Essential hypertension   Class 3 severe obesity due to excess calories with serious comorbidity and body mass index (BMI) of 40.0 to 44.9 in adult   DM type 2 causing CKD stage 3 (HCC)  Resolved Problems:   * No resolved hospital problems. *  Hospital Course: Abigail Mendez is a 58 y.o. female with medical history significant of type 2 diabetes, coronary artery disease, GERD, hypothyroidism, essential hypertension, anxiety disorder, asthma, obstructive sleep apnea, PTSD, GERD, fibromyalgia, who presented to med [facility] with complaint of chest pain.  Patient had a dental procedure on Wednesday and since then she has been getting weak and became hypotensive.  She is also noted central chest pain.  She was not treated with any antibiotics for prophylaxis apparently.  Patient was seen and evaluated in the ER.  Initial workup including troponins were flat.  Patient was noted to have a potassium 3.2.  Creatinine is 1.27 chest x-ray showed no acute finding.  EKG showed normal sinus rhythm with a rate of 78.  No significant ST changes.  Patient has a calcium of 10.6.  Patient is being admitted to the medical service for rule out MI.  Initial troponin was 34 and 28.  Patient's chest pain subsided.  Troponins flat out.  Echocardiogram was done which did not show any wall motion abnormalities  with normal ejection fraction.  Patient was being planned for a stress test on Monday.  Patient was given the option to wait until Monday and get the stress test done.  However patient chose to go home and do the outpatient stress test.  Patient is alert awake and oriented and she is able to make the decision.  She chose to get discharged home and follow-up with primary care provider as outpatient and cardiologist for possible outpatient stress test.  Patient will be discharged home on her home medications and Augmentin  for 3 to 4 days.  Assessment and Plan:   1.  Chest pain: -Enzymes is minimally elevated. -No chest pain - Echocardiogram showed no wall motion abnormalities, EF normal - Patient was given option to stay until Monday to get the stress test or discharged home and with outpatient stress test.  Patient chose to go home and do the outpatient stress test. She will be discharged home on her home medications and follow-up with cardiologist and outpatient primary care physician.   2.  Essential hypertension: Continue with blood pressure medication.   3.  Hypokalemia: Replaced potassium   4.  Morbid obesity: Dietary counseling   5.  History of asthma: No acute exacerbation.  Continue home regimen   6.  Fibromyalgia: Patient on multiple multivitamins.  Would likely resume that.   7.  Morbid obesity: Dietary counseling continue to monitor  8.  OSA: Continue home CPAP          Pain control - [place]  Controlled Substance Reporting System  database was reviewed. and patient was instructed, not to drive, operate heavy machinery, perform activities at heights, swimming or participation in water activities or provide baby-sitting services while on Pain, Sleep and Anxiety Medications; until their outpatient Physician has advised to do so again. Also recommended to not to take more than prescribed Pain, Sleep and Anxiety Medications.  Consultants: None Procedures performed:  Echocardiogram Disposition: Home Diet recommendation:  Regular diet DISCHARGE MEDICATION: Allergies as of 10/05/2023       Reactions   Benztropine Mesylate    Other reaction(s): Unknown   Fluticasone -salmeterol Nausea Only, Other (See Comments)   Lactose    Other reaction(s): Unknown   Tizanidine Hcl Other (See Comments)   Other reaction(s): Unknown   Amlodipine Swelling   Other reaction(s): Unknown   Benztropine Nausea And Vomiting   Cyclobenzaprine    Hallucinations   Gabapentin    Other reaction(s): Unknown   Lactose Intolerance (gi) Diarrhea   Lamotrigine    'fall', balance problems   Latex    Redness,swelling,itching, localized pain   Lisinopril    cough   Pregabalin Other (See Comments)   Topamax [topiramate] Other (See Comments)   Burning in hands and feet   Tramadol Nausea And Vomiting   Trazodone Hcl Other (See Comments)   Other reaction(s): Unknown   Phenylalanine Rash   Sarilumab Itching        Medication List     TAKE these medications    acetaminophen  500 MG tablet Commonly known as: TYLENOL  Take 1,000 mg by mouth in the morning, at noon, and at bedtime.   albuterol  108 (90 Base) MCG/ACT inhaler Commonly known as: VENTOLIN  HFA INHALE 2 PUFFS INTO THE LUNGS EVERY 6 HOURS AS NEEDED FOR WHEEZING OR SHORTNESS OF BREATH   amoxicillin -clavulanate 875-125 MG tablet Commonly known as: AUGMENTIN  Take 1 tablet by mouth every 12 (twelve) hours.   ascorbic Acid 500 MG Cpcr Commonly known as: VITAMIN C Take by mouth.   aspirin  81 MG tablet Take 81 mg by mouth daily.   b complex vitamins tablet Take 1 tablet by mouth daily.   buprenorphine  20 MCG/HR Ptwk Commonly known as: BUTRANS  Place 1 patch onto the skin once a week.   busPIRone  10 MG tablet Commonly known as: BUSPAR  Take 10 mg by mouth 2 (two) times daily.   CALCIUM PO Take by mouth.   carvedilol  25 MG tablet Commonly known as: COREG  Take 25 mg by mouth 2 (two) times daily.    Cranberry Concentrate 500 MG Caps Generic drug: Cranberry   cyanocobalamin 100 MCG tablet Take by mouth.   ELDERBERRY-VITAMIN C-ZINC PO   famotidine  10 MG tablet Commonly known as: PEPCID  Take 10 mg by mouth daily.   felodipine  5 MG 24 hr tablet Commonly known as: PLENDIL  Take 5 mg by mouth daily.   fluticasone  50 MCG/ACT nasal spray Commonly known as: FLONASE  USE 1 SPRAY NASALLY EVERY DAY   folic acid 400 MCG tablet Commonly known as: FOLVITE Take by mouth.   furosemide  20 MG tablet Commonly known as: LASIX  Take 20 mg by mouth 2 (two) times daily.   Garlic 1000 MG Caps   Ginkgo Biloba 40 MG Tabs Take 60 mg by mouth.   glucose blood test strip 1 each by Other route as needed for other. Use as instructed   hydrALAZINE  25 MG tablet Commonly known as: APRESOLINE  Take 25 mg by mouth 3 (three) times daily. If BP >125   HYDROcodone -acetaminophen  5-325 MG tablet Commonly known as:  NORCO/VICODIN Take 1 tablet by mouth every 6 (six) hours as needed for moderate pain (pain score 4-6).   IVIZIA DRY EYES OP Apply to eye.   levothyroxine  50 MCG tablet Commonly known as: SYNTHROID  Take 50 mcg by mouth daily.   losartan  100 MG tablet Commonly known as: COZAAR  Take 100 mg by mouth at bedtime.   MAGNACAPS PO Take by mouth.   methocarbamol  500 MG tablet Commonly known as: ROBAXIN  Take 500-1,000 mg by mouth 2 (two) times daily as needed for muscle spasms.   multivitamin with minerals Tabs tablet Take 1 tablet by mouth daily.   OZEMPIC (0.25 OR 0.5 MG/DOSE) [place] Inject 2 mg into the skin once a week.   potassium chloride 10 MEQ tablet Commonly known as: KLOR-CON M Take 10 mEq by mouth daily.   prednisoLONE  acetate 1 % ophthalmic suspension Commonly known as: PRED FORTE  Place 1 drop into both eyes 4 (four) times daily.   pyridOXINE  25 MG tablet Commonly known as: VITAMIN B6 Take 1 tablet (25 mg total) by mouth daily. Start taking on: October 06, 2023 What  changed:  medication strength how much to take when to take this   triamcinolone  cream 0.1 % Commonly known as: KENALOG  Apply 1 Application topically 2 (two) times daily.        Discharge Exam: Filed Weights   10/05/23 0641  Weight: 97 kg   Constitutional: Alert, awake, calm, comfortable HEENT: Neck supple Respiratory: clear to auscultation bilaterally, no wheezing, no crackles. Normal respiratory effort. No accessory muscle use.  Cardiovascular: Regular rate and rhythm, no murmurs / rubs / gallops. No extremity edema. 2+ pedal pulses. No carotid bruits.  Abdomen: no tenderness, no masses palpated. No hepatosplenomegaly. Bowel sounds positive.  Musculoskeletal: no clubbing / cyanosis. No joint deformity upper and lower extremities. Good ROM, no contractures. Normal muscle tone.  Skin: no rashes, lesions, ulcers. No induration Neurologic: CN 2-12 grossly intact. Sensation intact, DTR normal. Strength 5/5 x all 4 extremities.  Psychiatric: Normal judgment and insight. Alert and oriented x 3. Normal mood.    Condition at discharge: stable  The results of significant diagnostics from this hospitalization (including imaging, microbiology, ancillary and laboratory) are listed below for reference.   Imaging Studies: ECHOCARDIOGRAM COMPLETE Result Date: 10/05/2023    ECHOCARDIOGRAM REPORT   Patient Name:   Abigail Mendez Date of Exam: 10/05/2023 Medical Rec #:  [id number]       Height:       65.0 in Accession #:    [id number]      Weight:       213.8 lb Date of Birth:  July 04, 1965        BSA:          2.035 m Patient Age:    58 years        BP:           116/65 mmHg Patient Gender: F               HR:           73 bpm. Exam Location:  Inpatient Procedure: 2D Echo, Cardiac Doppler and Color Doppler (Both Spectral and Color            Flow Doppler were utilized during procedure). Indications:    Chest Pain R07.9  History:        Patient has no prior history of Echocardiogram examinations.  Signs/Symptoms:Chest Pain and Syncope; Risk                 Factors:Hypertension, Sleep Apnea and Diabetes.  Sonographer:    Thea Norlander RCS Referring Phys: [id number] MOHAMMAD L GARBA IMPRESSIONS  1. Left ventricular ejection fraction, by estimation, is 55 to 60%. The left ventricle has normal function. The left ventricle has no regional wall motion abnormalities. Left ventricular diastolic parameters were normal.  2. Right ventricular systolic function is normal. The right ventricular size is normal. Tricuspid regurgitation signal is inadequate for assessing PA pressure.  3. The mitral valve is normal in structure. Mild to moderate mitral valve regurgitation. No evidence of mitral stenosis.  4. The aortic valve is tricuspid. Aortic valve regurgitation is moderate. No aortic stenosis is present.  5. The inferior vena cava is normal in size with greater than 50% respiratory variability, suggesting right atrial pressure of 3 mmHg. FINDINGS  Left Ventricle: Left ventricular ejection fraction, by estimation, is 55 to 60%. The left ventricle has normal function. The left ventricle has no regional wall motion abnormalities. The left ventricular internal cavity size was normal in size. There is  no left ventricular hypertrophy. Left ventricular diastolic parameters were normal. Right Ventricle: The right ventricular size is normal. No increase in right ventricular wall thickness. Right ventricular systolic function is normal. Tricuspid regurgitation signal is inadequate for assessing PA pressure. Left Atrium: Left atrial size was normal in size. Right Atrium: Right atrial size was normal in size. Pericardium: There is no evidence of pericardial effusion. Mitral Valve: The mitral valve is normal in structure. Mild to moderate mitral valve regurgitation, with centrally-directed jet. No evidence of mitral valve stenosis. Tricuspid Valve: The tricuspid valve is normal in structure. Tricuspid valve regurgitation is  not demonstrated. No evidence of tricuspid stenosis. Aortic Valve: The aortic valve is tricuspid. Aortic valve regurgitation is moderate. No aortic stenosis is present. Aortic valve peak gradient measures 9.0 mmHg. Pulmonic Valve: The pulmonic valve was normal in structure. Pulmonic valve regurgitation is trivial. No evidence of pulmonic stenosis. Aorta: The aortic root and ascending aorta are structurally normal, with no evidence of dilitation. Venous: The inferior vena cava is normal in size with greater than 50% respiratory variability, suggesting right atrial pressure of 3 mmHg. IAS/Shunts: No atrial level shunt detected by color flow Doppler.  LEFT VENTRICLE PLAX 2D LVIDd:         3.90 cm   Diastology LVIDs:         2.70 cm   LV e' medial:    8.27 cm/s LV PW:         1.00 cm   LV E/e' medial:  13.7 LV IVS:        0.90 cm   LV e' lateral:   9.03 cm/s LVOT diam:     1.90 cm   LV E/e' lateral: 12.5 LV SV:         57 LV SV Index:   28 LVOT Area:     2.84 cm  RIGHT VENTRICLE             IVC RV S prime:     14.50 cm/s  IVC diam: 1.40 cm TAPSE (M-mode): 2.4 cm LEFT ATRIUM             Index        RIGHT ATRIUM           Index LA diam:        3.50 cm 1.72 cm/m  RA Area:     10.60 cm LA Vol (A2C):   38.9 ml 19.11 ml/m  RA Volume:   22.60 ml  11.10 ml/m LA Vol (A4C):   42.8 ml 21.03 ml/m LA Biplane Vol: 42.5 ml 20.88 ml/m  AORTIC VALVE AV Area (Vmax): 1.69 cm AV Vmax:        150.00 cm/s AV Peak Grad:   9.0 mmHg LVOT Vmax:      89.60 cm/s LVOT Vmean:     58.200 cm/s LVOT VTI:       0.200 m  AORTA Ao Root diam: 2.90 cm Ao Asc diam:  3.10 cm MITRAL VALVE MV Area (PHT): 3.65 cm     SHUNTS MV Decel Time: 208 msec     Systemic VTI:  0.20 m MV E velocity: 113.00 cm/s  Systemic Diam: 1.90 cm MV A velocity: 112.00 cm/s MV E/A ratio:  1.01 Mihai Croitoru MD Electronically signed by Jerel Balding MD Signature Date/Time: 10/05/2023/1:05:42 PM    Final    DG Chest 2 View Result Date: 10/04/2023 CLINICAL DATA:   Lightheadedness. EXAM: CHEST - 2 VIEW COMPARISON:  March 30, 2022. FINDINGS: The heart size and mediastinal contours are within normal limits. Both lungs are clear. The visualized skeletal structures are unremarkable. IMPRESSION: No active cardiopulmonary disease. Electronically Signed   By: Lynwood Landy Raddle M.D.   On: 10/04/2023 10:10    Microbiology: Results for orders placed or performed during the hospital encounter of 10/04/23  Blood culture (routine x 2)     Status: None (Preliminary result)   Collection Time: 10/04/23  9:36 AM   Specimen: BLOOD RIGHT ARM  Result Value Ref Range Status   Specimen Description   Final    BLOOD RIGHT ARM Performed at [facility], [address] [place], [place] [address]    Special Requests   Final    BOTTLES DRAWN AEROBIC AND ANAEROBIC Blood Culture adequate volume Performed at [facility], [address] [place], [place] [address]    Culture   Final    NO GROWTH < 24 HOURS Performed at [facility], [address], [place], [place] [address]    Report Status PENDING  Incomplete    Labs: CBC: Recent Labs  Lab 10/04/23 0936 10/04/23 2219  WBC 5.8 5.7  NEUTROABS 2.3  --   HGB 12.1 11.2*  HCT 36.4 33.3*  MCV 82.2 82.2  PLT 285 260   Basic Metabolic Panel: Recent Labs  Lab 10/04/23 0936 10/04/23 2219  NA 141  --   K 3.2*  --   CL 102  --   CO2 26  --   GLUCOSE 91  --   BUN 10  --   CREATININE 1.27* 1.15*  CALCIUM 10.6*  --    Liver Function Tests: Recent Labs  Lab 10/04/23 0936  AST 24  ALT 16  ALKPHOS 85  BILITOT 0.6  PROT 8.0  ALBUMIN 4.4   CBG: Recent Labs  Lab 10/04/23 2045 10/05/23 0623 10/05/23 1154 10/05/23 1641  GLUCAP 110* 87 90 85    Discharge time spent: greater than 30 minutes.  Signed: Nena Rebel, MD [facility] 10/05/2023

## 2023-10-05 NOTE — Plan of Care (Signed)
  Problem: Clinical Measurements: Goal: Will remain free from infection Outcome: Progressing Goal: Respiratory complications will improve Outcome: Progressing Goal: Cardiovascular complication will be avoided Outcome: Progressing   Problem: Activity: Goal: Risk for activity intolerance will decrease Outcome: Progressing   Problem: Coping: Goal: Level of anxiety will decrease Outcome: Progressing   Problem: Safety: Goal: Ability to remain free from injury will improve Outcome: Progressing   Problem: Cardiac: Goal: Ability to achieve and maintain adequate cardiovascular perfusion will improve Outcome: Progressing

## 2023-10-09 LAB — CULTURE, BLOOD (ROUTINE X 2)
Culture: NO GROWTH
Special Requests: ADEQUATE

## 2023-10-27 ENCOUNTER — Ambulatory Visit (HOSPITAL_COMMUNITY)
Admission: EM | Admit: 2023-10-27 | Discharge: 2023-10-27 | Disposition: A | Discharge status: Home / Self Care | Attending: Emergency Medicine | Admitting: Emergency Medicine

## 2023-10-27 ENCOUNTER — Encounter (HOSPITAL_COMMUNITY): Payer: Self-pay

## 2023-10-27 DIAGNOSIS — L299 Pruritus, unspecified: Secondary | ICD-10-CM | POA: Diagnosis not present

## 2023-10-27 LAB — COMPREHENSIVE METABOLIC PANEL WITH GFR
ALT: 17 U/L (ref 0–44)
AST: 22 U/L (ref 15–41)
Albumin: 3.6 g/dL (ref 3.5–5.0)
Alkaline Phosphatase: 74 U/L (ref 38–126)
Anion gap: 10 (ref 5–15)
BUN: 10 mg/dL (ref 6–20)
CO2: 27 mmol/L (ref 22–32)
Calcium: 9.5 mg/dL (ref 8.9–10.3)
Chloride: 102 mmol/L (ref 98–111)
Creatinine, Ser: 1.18 mg/dL — ABNORMAL HIGH (ref 0.44–1.00)
GFR, Estimated: 53 mL/min — ABNORMAL LOW (ref >60)
Glucose, Bld: 81 mg/dL (ref 70–99)
Potassium: 3.7 mmol/L (ref 3.5–5.1)
Sodium: 139 mmol/L (ref 135–145)
Total Bilirubin: 0.9 mg/dL (ref 0.0–1.2)
Total Protein: 7.1 g/dL (ref 6.5–8.1)

## 2023-10-27 NOTE — ED Provider Notes (Signed)
 [facility]    CSN: [id number] Arrival date & time: 10/27/23  1249      History   Chief Complaint Chief Complaint  Patient presents with   Insect Bite    HPI Abigail Mendez is a 58 y.o. female. She reports an insect bite, unknown insect, to her L back/flank area 3 days ago. It has been very itchy and now she is itchy all over. Occasionally takes benadryl  for it but it doesn't seem to help.   Has multiple other chronic issues for which she has specialists she can see. Has had an increase in nausea and vomiting for over a month, has not talked with GI about it. Was seen in ED in July for chest pain and later had stress test. Has f/u with cards in September, is meeting new PCP in September. REview of ED visit shows pt had mild hypokalemia which was replaced - K has not been rechecked.   HPI  Past Medical History:  Diagnosis Date   Anemia    Anxiety    Asthma    CAD (coronary artery disease)    Chronic pain disorder    Chronic stable angina (HCC)    Colitis    Daytime somnolence 01/07/2013   Depression    Diabetes mellitus, type II (HCC)    Fibromyalgia    GERD (gastroesophageal reflux disease)    Hypertension    Hypothyroidism    Left kidney mass    MDD (major depressive disorder) 01/07/2013   Obesity    OSA on CPAP 01/07/2013   Pinched nerve    PTSD (post-traumatic stress disorder)    Pulmonary scarring    Seasonal allergies    Sickle cell trait (HCC)    Sleep apnea    Vaginitis     Patient Active Problem List   Diagnosis Date Noted   Chest pain 10/04/2023   Mild persistent asthma without complication 12/06/2021   Abnormal CT scan, lung 12/06/2021   Carpal tunnel syndrome 12/04/2021   H/O multiple pulmonary nodules 12/04/2021   Kidney disease 12/04/2021   Ventral hernia without obstruction or gangrene 12/01/2021   Overactive bladder 04/27/2021   Class 3 severe obesity due to excess calories with serious comorbidity and body mass index (BMI) of  40.0 to 44.9 in adult 05/03/2020   Anemia of chronic renal failure, stage 3a (HCC) 03/17/2020   Lymphocytosis 03/17/2020   Dyspnea on exertion 01/07/2020   Palpitations 01/07/2020   DM type 2 causing CKD stage 3 (HCC) 08/06/2019   Fatty (change of) liver, not elsewhere classified 04/07/2019   Glenohumeral arthritis, left 12/12/2018   Cervical cancer screening 09/02/2018   Neuropathy 04/14/2017   High risk medication use 09/06/2016   Dizziness 08/21/2016   Status post placement of implantable loop recorder 04/23/2016   Elevated LFTs 01/04/2016   Elevated serum creatinine 12/27/2015   Syncope and collapse 12/01/2015   Social anxiety disorder 03/23/2014   GAD (generalized anxiety disorder) 09/15/2013   Restless leg syndrome 09/03/2013   Chronic myofascial pain 05/22/2013   Fibromyalgia 02/06/2013   GERD (gastroesophageal reflux disease) 02/06/2013   Chronic fatigue 02/06/2013   Essential hypertension 02/06/2013   Insomnia 02/06/2013   Osteoarthritis 02/06/2013   OSA on CPAP 01/07/2013   MDD (major depressive disorder) 01/07/2013   PTSD (post-traumatic stress disorder) 01/07/2013   Daytime somnolence 01/07/2013   Mild episode of recurrent major depressive disorder (HCC) 01/07/2013   Arthritis of both knees 12/25/2012   Pulmonary nodule 08/14/2012  Sickle cell trait (HCC) 03/19/2012   Hypothyroid 11/23/2010   Low back pain, unspecified 02/06/2010   Carpal tunnel syndrome on both sides 03/19/1982    Past Surgical History:  Procedure Laterality Date   EYE SURGERY Bilateral    04/21/23 and 05/09/23   HERNIA REPAIR  06/21/2023   LOOP RECORDER IMPLANT     NO PAST SURGERIES     RETINAL TEAR REPAIR CRYOTHERAPY Left 03/26/2022   WISDOM TOOTH EXTRACTION  1988    OB History   No obstetric history on file.      Home Medications    Prior to Admission medications   Medication Sig Start Date End Date Taking? Authorizing Provider  acetaminophen  (TYLENOL ) 500 MG tablet Take 1,000  mg by mouth in the morning, at noon, and at bedtime.    [provider]  albuterol  (VENTOLIN  HFA) 108 (90 Base) MCG/ACT inhaler INHALE 2 PUFFS INTO THE LUNGS EVERY 6 HOURS AS NEEDED FOR WHEEZING OR SHORTNESS OF BREATH 02/28/22   Kassie Acquanetta Bradley, MD  amoxicillin -clavulanate (AUGMENTIN ) 875-125 MG tablet Take 1 tablet by mouth every 12 (twelve) hours. Patient not taking: Reported on 10/27/2023 10/05/23   Paudel, Keshab, MD  ascorbic Acid (VITAMIN C) 500 MG CPCR Take by mouth. 01/17/22   [provider]  aspirin  81 MG tablet Take 81 mg by mouth daily.    [provider]  b complex vitamins tablet Take 1 tablet by mouth daily.    [provider]  buprenorphine  (BUTRANS ) 20 MCG/HR PTWK Place 1 patch onto the skin once a week.    [provider]  busPIRone  (BUSPAR ) 10 MG tablet Take 10 mg by mouth 2 (two) times daily. 08/21/22   [provider]  CALCIUM PO Take by mouth.    [provider]  carvedilol  (COREG ) 25 MG tablet Take 25 mg by mouth 2 (two) times daily.    [provider]  Cranberry (CRANBERRY CONCENTRATE) 500 MG CAPS  01/12/23   [provider]  cyanocobalamin 100 MCG tablet Take by mouth. 01/17/22   [provider]  ELDERBERRY-VITAMIN C-ZINC PO  05/23/21   [provider]  famotidine  (PEPCID ) 10 MG tablet Take 10 mg by mouth daily. 10/03/21   [provider]  felodipine  (PLENDIL ) 5 MG 24 hr tablet Take 5 mg by mouth daily. 12/18/22   [provider]  fluticasone  (FLONASE ) 50 MCG/ACT nasal spray USE 1 SPRAY NASALLY EVERY DAY 09/05/22   [provider]  folic acid (FOLVITE) 400 MCG tablet Take by mouth. Patient not taking: Reported on 10/04/2023    [provider]  furosemide  (LASIX ) 20 MG tablet Take 20 mg by mouth 2 (two) times daily. 12/01/21   [provider]  Garlic 1000 MG CAPS  08/18/19   [provider]  Ginkgo Biloba 40 MG TABS Take 60 mg by  mouth.    [provider]  glucose blood test strip 1 each by Other route as needed for other. Use as instructed    [provider]  hydrALAZINE  (APRESOLINE ) 25 MG tablet Take 25 mg by mouth 3 (three) times daily. If BP >125 04/02/22   [provider]  HYDROcodone -acetaminophen  (NORCO/VICODIN) 5-325 MG tablet Take 1 tablet by mouth every 6 (six) hours as needed for moderate pain (pain score 4-6).    [provider]  levothyroxine  (SYNTHROID , LEVOTHROID) 50 MCG tablet Take 50 mcg by mouth daily. 06/23/12   [provider]  losartan  (COZAAR ) 100 MG tablet  Take 100 mg by mouth at bedtime.    [provider]  Magnesium (MAGNACAPS PO) Take by mouth.    [provider]  methocarbamol  (ROBAXIN ) 500 MG tablet Take 500-1,000 mg by mouth 2 (two) times daily as needed for muscle spasms. 09/19/23   [provider]  Multiple Vitamin (MULTIVITAMIN WITH MINERALS) TABS tablet Take 1 tablet by mouth daily.    [provider]  potassium chloride (KLOR-CON M) 10 MEQ tablet Take 10 mEq by mouth daily.    [provider]  Povidone (IVIZIA DRY EYES OP) Apply to eye.    [provider]  prednisoLONE  acetate (PRED FORTE ) 1 % ophthalmic suspension Place 1 drop into both eyes 4 (four) times daily.    [provider]  pyridOXINE  (VITAMIN B6) 25 MG tablet Take 1 tablet (25 mg total) by mouth daily. 10/06/23   Paudel, Keshab, MD  Semaglutide (OZEMPIC, 0.25 OR 0.5 MG/DOSE, [place]) Inject 2 mg into the skin once a week. 04/25/22   [provider]  triamcinolone  cream (KENALOG ) 0.1 % Apply 1 Application topically 2 (two) times daily. 09/19/23   [provider]    Family History Family History  Problem Relation Age of Onset   Osteoarthritis Mother    Asthma Mother    Hypertension Mother    Cancer Mother    High Cholesterol Mother    Breast cancer Sister 37   Bipolar disorder Sister    Suicidality Neg Hx     Sleep apnea Neg Hx     Social History Social History   Tobacco Use   Smoking status: Never   Smokeless tobacco: Never  Vaping Use   Vaping status: Never Used  Substance Use Topics   Alcohol  use: No   Drug use: No     Allergies   Benztropine mesylate, Fluticasone -salmeterol, Lactose, Tizanidine hcl, Amlodipine, Atorvastatin, Benztropine, Cyclobenzaprine, Gabapentin, Lactose intolerance (gi), Lamotrigine, Latex, Lisinopril, Pregabalin, Rosuvastatin, Topamax [topiramate], Tramadol, Trazodone hcl, Wound dressing adhesive, Phenylalanine, and Sarilumab   Review of Systems Review of Systems   Physical Exam Triage Vital Signs ED Triage Vitals [10/27/23 1357]  Encounter Vitals Group     BP 121/68     Girls Systolic BP Percentile      Girls Diastolic BP Percentile      Boys Systolic BP Percentile      Boys Diastolic BP Percentile      Pulse Rate 67     Resp 16     Temp 98.8 F (37.1 C)     Temp Source Oral     SpO2 96 %     Weight      Height      Head Circumference      Peak Flow      Pain Score 7     Pain Loc      Pain Education      Exclude from Growth Chart    No data found.  Updated Vital Signs BP 121/68 (BP Location: Right Arm)   Pulse 67   Temp 98.8 F (37.1 C) (Oral)   Resp 16   LMP 03/25/2014   SpO2 96%   Visual Acuity Right Eye Distance:   Left Eye Distance:   Bilateral Distance:    Right Eye Near:   Left Eye Near:    Bilateral Near:     Physical Exam Constitutional:      Appearance: Normal appearance.  Eyes:     General: No scleral icterus. Skin:  Comments: No other rash areas noted. I don't see active insect bite area  Neurological:     Mental Status: She is alert.      UC Treatments / Results  Labs (all labs ordered are listed, but only abnormal results are displayed) Labs Reviewed  COMPREHENSIVE METABOLIC PANEL WITH GFR    EKG   Radiology No results found.  Procedures Procedures (including critical care  time)  Medications Ordered in UC Medications - No data to display  Initial Impression / Assessment and Plan / UC Course  I have reviewed the triage vital signs and the nursing notes.  Pertinent labs & imaging results that were available during my care of the patient were reviewed by me and considered in my medical decision making (see chart for details).    As pt is between PCP's, will recheck CMP to check K and also to recheck kidney function and liver function since she is itchy all over without widespread rash. Will have her try nonsedating antihistamine to manage itching. Discussed change in usual GI symptoms over last month or so, recommended she f/u with her GI.    Final Clinical Impressions(s) / UC Diagnoses   Final diagnoses:  Pruritus     Discharge Instructions      Try Claritin or Allegra (ok to use generic brands) daily per package directions until your itching is gone. Then continue to use it for another 1-2 weeks before stopping.   Try benadryl  gel or hydrocortisone cream on your itchy area of your back near where the bite was to control your itching in that spot.   You will get a call if blood tests are abnormal, you will not get a call if tests are normal but you can check results in MyChart if you have a MyChart account. .    ED Prescriptions   None    PDMP not reviewed this encounter.   Richad Jon HERO, NP 10/27/23 1536

## 2023-10-27 NOTE — ED Triage Notes (Signed)
 Patient reports that she was bitten by an insect bite to the left side 3 days ago. Patient states she has been using charcoal and coconut oil cream. Patient c/o itching to that area and states she has been having itching to the face and scalp.

## 2023-10-27 NOTE — Discharge Instructions (Signed)
 Try Claritin or Allegra (ok to use generic brands) daily per package directions until your itching is gone. Then continue to use it for another 1-2 weeks before stopping.   Try benadryl  gel or hydrocortisone cream on your itchy area of your back near where the bite was to control your itching in that spot.   You will get a call if blood tests are abnormal, you will not get a call if tests are normal but you can check results in MyChart if you have a MyChart account. SABRA

## 2023-10-28 ENCOUNTER — Ambulatory Visit (HOSPITAL_COMMUNITY): Payer: Self-pay

## 2023-11-27 ENCOUNTER — Encounter: Payer: Self-pay | Admitting: Nurse Practitioner

## 2023-11-28 ENCOUNTER — Other Ambulatory Visit: Payer: Self-pay | Admitting: Nurse Practitioner

## 2023-11-28 DIAGNOSIS — R918 Other nonspecific abnormal finding of lung field: Secondary | ICD-10-CM

## 2023-12-04 ENCOUNTER — Other Ambulatory Visit: Payer: Self-pay | Admitting: Nurse Practitioner

## 2023-12-04 ENCOUNTER — Ambulatory Visit
Admission: RE | Admit: 2023-12-04 | Discharge: 2023-12-04 | Disposition: A | Discharge status: Home / Self Care | Payer: Medicare (Managed Care) | Source: Ambulatory Visit | Attending: Nurse Practitioner | Admitting: Nurse Practitioner

## 2023-12-04 DIAGNOSIS — M19012 Primary osteoarthritis, left shoulder: Secondary | ICD-10-CM

## 2023-12-05 ENCOUNTER — Ambulatory Visit
Admission: RE | Admit: 2023-12-05 | Discharge: 2023-12-05 | Disposition: A | Discharge status: Home / Self Care | Source: Ambulatory Visit | Attending: Nurse Practitioner | Admitting: Nurse Practitioner

## 2023-12-05 DIAGNOSIS — R918 Other nonspecific abnormal finding of lung field: Secondary | ICD-10-CM

## 2023-12-05 MED ORDER — IOPAMIDOL (ISOVUE-370) INJECTION 76%
75.0000 mL | Freq: Once | INTRAVENOUS | Status: AC | PRN
  Administered 2023-12-05: 75 mL via INTRAVENOUS

## 2024-01-22 NOTE — Progress Notes (Signed)
 Cardiology Office Note:    Date:  01/28/2024   ID:  Abigail Mendez, DOB 02/21/1966, MRN [id number]  PCP:  Cecille Pellet, NP   [facility] Providers Cardiologist:  None     Referring MD: Jacques Camie Franchot SHAUNNA*   Chief Complaint  Patient presents with   Coronary Artery Disease   Hypertension    History of Present Illness:    Abigail Mendez is a 58 y.o. female is seen at the request of Dr Cloria for evaluation of Chest pain. She has a history of DM type 2, asthma, HTN, CKD, OSA on CPAP, and sickle cell trait. She was admitted in July with some chest pain following a dental procedure. Ecg was normal. Troponin 34 and 28. Echo was unremarkable.  She has had prior CT of chest showing some coronary calcification.   In reviewing her records she has had extensive cardiac evaluation at [facility]. This includes multiple Echos- last in April 2024. Multiple nuclear stress tests - last in July 2025- all normal. Coronary CTA done Feb 2024 showed mild nonobstructive CAD. Cardiac cath done May 2024 showed 25% OM lesion otherwise normal.   She states she has chronic angina. Chest pain is pressure without relation to exertion or stress. Also has some intermittent SOB. Has not used Ntg. Prior CT negative for PE. She has difficult to control BP. Renal duplex negative. She is intolerant to statins. Reports a lot of swelling over the past 4 months. Home BP readings typically around 140 but may go up to 150-160. Her activity is limited due to fibromyalgia, RA and chronic fatigue. Currently doing PT/OT.    Past Medical History:  Diagnosis Date   Anemia    Anxiety    Asthma    CAD (coronary artery disease)    Chronic pain disorder    Chronic stable angina    Colitis    Daytime somnolence 01/07/2013   Depression    Diabetes mellitus, type II (HCC)    Fibromyalgia    GERD (gastroesophageal reflux disease)    Hypertension    Hypothyroidism    Left kidney mass    MDD (major depressive  disorder) 01/07/2013   Obesity    OSA on CPAP 01/07/2013   Pinched nerve    PTSD (post-traumatic stress disorder)    Pulmonary scarring    Seasonal allergies    Sickle cell trait    Sleep apnea    Vaginitis     Past Surgical History:  Procedure Laterality Date   EYE SURGERY Bilateral    04/21/23 and 05/09/23   HERNIA REPAIR  06/21/2023   LOOP RECORDER IMPLANT     NO PAST SURGERIES     RETINAL TEAR REPAIR CRYOTHERAPY Left 03/26/2022   WISDOM TOOTH EXTRACTION  1988    Current Medications: Current Meds  Medication Sig   acetaminophen  (TYLENOL ) 500 MG tablet Take 1,000 mg by mouth in the morning, at noon, and at bedtime.   albuterol  (VENTOLIN  HFA) 108 (90 Base) MCG/ACT inhaler INHALE 2 PUFFS INTO THE LUNGS EVERY 6 HOURS AS NEEDED FOR WHEEZING OR SHORTNESS OF BREATH   ascorbic Acid (VITAMIN C) 500 MG CPCR Take by mouth.   aspirin  81 MG tablet Take 81 mg by mouth daily.   b complex vitamins tablet Take 1 tablet by mouth daily.   buprenorphine  (BUTRANS ) 20 MCG/HR PTWK Place 1 patch onto the skin once a week.   busPIRone  (BUSPAR ) 10 MG tablet Take 10 mg by mouth 2 (two)  times daily.   CALCIUM PO Take by mouth.   carvedilol  (COREG ) 25 MG tablet Take 25 mg by mouth 2 (two) times daily.   Cranberry (CRANBERRY CONCENTRATE) 500 MG CAPS    cyanocobalamin 100 MCG tablet Take by mouth.   famotidine  (PEPCID ) 10 MG tablet Take 10 mg by mouth daily.   felodipine  (PLENDIL ) 5 MG 24 hr tablet Take 5 mg by mouth daily.   fluticasone  (FLONASE ) 50 MCG/ACT nasal spray USE 1 SPRAY NASALLY EVERY DAY   furosemide  (LASIX ) 20 MG tablet Take 20 mg by mouth 2 (two) times daily.   Garlic 1000 MG CAPS    Ginkgo Biloba 40 MG TABS Take 60 mg by mouth.   glucose blood test strip 1 each by Other route as needed for other. Use as instructed   hydrALAZINE  (APRESOLINE ) 25 MG tablet Take 25 mg by mouth 3 (three) times daily. If BP >125   HYDROcodone -acetaminophen  (NORCO/VICODIN) 5-325 MG tablet Take 1 tablet by mouth  every 6 (six) hours as needed for moderate pain (pain score 4-6).   levothyroxine  (SYNTHROID , LEVOTHROID) 50 MCG tablet Take 50 mcg by mouth daily.   Magnesium (MAGNACAPS PO) Take by mouth.   methocarbamol  (ROBAXIN ) 500 MG tablet Take 500-1,000 mg by mouth 2 (two) times daily as needed for muscle spasms.   Multiple Vitamin (MULTIVITAMIN WITH MINERALS) TABS tablet Take 1 tablet by mouth daily.   potassium chloride (KLOR-CON M) 10 MEQ tablet Take 10 mEq by mouth daily.   Povidone (IVIZIA DRY EYES OP) Apply to eye.   prednisoLONE  acetate (PRED FORTE ) 1 % ophthalmic suspension Place 1 drop into both eyes 4 (four) times daily.   pyridOXINE  (VITAMIN B6) 25 MG tablet Take 1 tablet (25 mg total) by mouth daily.   Semaglutide (OZEMPIC, 0.25 OR 0.5 MG/DOSE, [place]) Inject 2 mg into the skin once a week.   triamcinolone  cream (KENALOG ) 0.1 % Apply 1 Application topically 2 (two) times daily.   [DISCONTINUED] losartan  (COZAAR ) 100 MG tablet Take 100 mg by mouth at bedtime.   [DISCONTINUED] valsartan (DIOVAN) 320 MG tablet Take 1 tablet (320 mg total) by mouth daily.     Allergies:   Benztropine mesylate, Fluticasone -salmeterol, Lactose, Tizanidine hcl, Amlodipine, Atorvastatin, Benztropine, Cyclobenzaprine, Gabapentin, Lactose intolerance (gi), Lamotrigine, Latex, Lisinopril, Pregabalin, Rosuvastatin, Topamax [topiramate], Tramadol, Trazodone hcl, Wound dressing adhesive, Phenylalanine, and Sarilumab   Social History   Socioeconomic History   Marital status: Single    Spouse name: Not on file   Number of children: Not on file   Years of education: Not on file   Highest education level: Not on file  Occupational History   Not on file  Tobacco Use   Smoking status: Never   Smokeless tobacco: Never  Vaping Use   Vaping status: Never Used  Substance and Sexual Activity   Alcohol  use: No   Drug use: No   Sexual activity: Never    Birth control/protection: Abstinence  Other Topics Concern   Not on  file  Social History Narrative   Not on file   Social Drivers of Health   Financial Resource Strain: Low Risk  (04/29/2023)   Received from [facility]   Overall Financial Resource Strain (CARDIA)    Difficulty of Paying Living Expenses: Not hard at all  Recent Concern: Financial Resource Strain - Medium Risk (02/03/2023)   Received from [facility]   Overall Financial Resource Strain (CARDIA)    Difficulty of Paying Living Expenses: Somewhat hard  Food Insecurity:  No Food Insecurity (04/29/2023)   Received from [facility]   Hunger Vital Sign    Within the past 12 months, you worried that your food would run out before you got the money to buy more.: Never true    Within the past 12 months, the food you bought just didn't last and you didn't have money to get more.: Never true  Recent Concern: Food Insecurity - Food Insecurity Present (02/03/2023)   Received from [facility]   Hunger Vital Sign    Worried About Running Out of Food in the Last Year: Sometimes true    Ran Out of Food in the Last Year: Never true  Transportation Needs: No Transportation Needs (04/29/2023)   Received from [facility] - Transportation    Lack of Transportation (Medical): No    Lack of Transportation (Non-Medical): No  Recent Concern: Transportation Needs - Unmet Transportation Needs (02/03/2023)   Received from [facility] - Transportation    Lack of Transportation (Medical): Yes    Lack of Transportation (Non-Medical): Yes  Physical Activity: Unknown (02/03/2023)   Received from [facility]   Exercise Vital Sign    On average, how many days per week do you engage in moderate to strenuous exercise (like a brisk walk)?: 0 days    Minutes of Exercise per Session: Not on file  Stress: Stress Concern Present (02/03/2023)   Received from [facility] - Occupational Stress Questionnaire    Feeling of Stress : Very much  Social  Connections: Somewhat Isolated (02/03/2023)   Received from [facility]   Social Network    How would you rate your social network (family, work, friends)?: Restricted participation with some degree of social isolation     Family History: The patient's family history includes Asthma in her mother; Bipolar disorder in her sister; Breast cancer (age of onset: 47) in her sister; Cancer in her mother; High Cholesterol in her mother; Hypertension in her mother; Osteoarthritis in her mother. There is no history of Suicidality or Sleep apnea.  ROS:   Please see the history of present illness.     All other systems reviewed and are negative.  EKGs/Labs/Other Studies Reviewed:    The following studies were reviewed today: Echo 10/05/23: IMPRESSIONS     1. Left ventricular ejection fraction, by estimation, is 55 to 60%. The  left ventricle has normal function. The left ventricle has no regional  wall motion abnormalities. Left ventricular diastolic parameters were  normal.   2. Right ventricular systolic function is normal. The right ventricular  size is normal. Tricuspid regurgitation signal is inadequate for assessing  PA pressure.   3. The mitral valve is normal in structure. Mild to moderate mitral valve  regurgitation. No evidence of mitral stenosis.   4. The aortic valve is tricuspid. Aortic valve regurgitation is moderate.  No aortic stenosis is present.   5. The inferior vena cava is normal in size with greater than 50%  respiratory variability, suggesting right atrial pressure of 3 mmHg.        Recent Labs: 10/04/2023: Hemoglobin 11.2; Platelets 260 10/27/2023: ALT 17; BUN 10; Creatinine, Ser 1.18; Potassium 3.7; Sodium 139  Recent Lipid Panel    Component Value Date/Time   CHOL 146 10/05/2023 0236   TRIG 58 10/05/2023 0236   HDL 40 (L) 10/05/2023 0236   CHOLHDL 3.7 10/05/2023 0236   VLDL 12 10/05/2023 0236  LDLCALC 94 10/05/2023 0236     Risk Assessment/Calculations:                 Physical Exam:    VS:  BP 130/84 (BP Location: Left Arm, Patient Position: Sitting, Cuff Size: Large)   Pulse 77   Resp 16   Ht 5' 5 (1.651 m)   Wt 234 lb 3.2 oz (106.2 kg)   LMP 03/25/2014   SpO2 98%   BMI 38.97 kg/m     Wt Readings from Last 3 Encounters:  01/28/24 234 lb 3.2 oz (106.2 kg)  10/05/23 213 lb 13.5 oz (97 kg)  06/08/23 219 lb (99.3 kg)     GEN:  Well nourished, obese in no acute distress HEENT: Normal NECK: No JVD; No carotid bruits LYMPHATICS: No lymphadenopathy CARDIAC: RRR, no murmurs, rubs, gallops RESPIRATORY:  Clear to auscultation without rales, wheezing or rhonchi  ABDOMEN: Soft, non-tender, non-distended MUSCULOSKELETAL:  No edema; No deformity  SKIN: Warm and dry NEUROLOGIC:  Alert and oriented x 3 PSYCHIATRIC:  Normal affect   ASSESSMENT:    1. Chronic chest pain   2. Essential hypertension   3. Coronary artery disease involving native coronary artery of native heart without angina pectoris   4. Pure hypercholesterolemia    PLAN:    In order of problems listed above:  Chronic chest pain. Given extensive cardiac evaluation suspect this is more related to her fibromyalgia. She could have microvascular angina but symptoms are atypical. No additional cardiac evaluation needed HTN. Poorly controlled. Rneal duplex normal. Recommend switching losartan  to valsartan 320 mg daily. Continue Coreg , felodipine  and lasix . Low sodium diet. Will arrange follow up with Pharm D in one month.  HLD. Goal LDL < 70. Statin intolerant. Will follow up with Pharm D to consider Nexlizet vs Repatha CKD stage 3a            Medication Adjustments/Labs and Tests Ordered: Current medicines are reviewed at length with the patient today.  Concerns regarding medicines are outlined above.  Orders Placed This Encounter  Procedures   AMB Referral to [facility] Pharm-D   Meds ordered this encounter  Medications   DISCONTD: valsartan (DIOVAN) 320  MG tablet    Sig: Take 1 tablet (320 mg total) by mouth daily.   valsartan (DIOVAN) 320 MG tablet    Sig: Take 1 tablet (320 mg total) by mouth daily.    Dispense:  90 tablet    Refill:  3    Patient Instructions  Medication Instructions:  Stop Losartan   Start Valsartan 320 mg daily Continue all other medications *If you need a refill on your cardiac medications before your next appointment, please call your pharmacy*  Lab Work: None ordered  Testing/Procedures: None ordered  Follow-Up: At [facility], you and your health needs are our priority.  As part of our continuing mission to provide you with exceptional heart care, our providers are all part of one team.  This team includes your primary Cardiologist (physician) and Advanced Practice Providers or APPs (Physician Assistants and Nurse Practitioners) who all work together to provide you with the care you need, when you need it.  Your next appointment:  3 months     Provider:  Dr.Ramie Erman          Schedule appointment with Pharmacist  [facility]      We recommend signing up for the patient portal called MyChart.  Sign up information is provided on this After Visit Summary.  MyChart  is used to connect with patients for Virtual Visits (Telemedicine).  Patients are able to view lab/test results, encounter notes, upcoming appointments, etc.  Non-urgent messages can be sent to your provider as well.   To learn more about what you can do with MyChart, go to [email or web address].     Signed, Wrenn Willcox, MD  01/28/2024 1:31 PM    [facility]

## 2024-01-28 ENCOUNTER — Encounter: Payer: Self-pay | Admitting: Cardiology

## 2024-01-28 ENCOUNTER — Ambulatory Visit: Payer: Medicare (Managed Care) | Attending: Cardiology | Admitting: Cardiology

## 2024-01-28 ENCOUNTER — Telehealth: Payer: Medicare (Managed Care) | Admitting: Adult Health

## 2024-01-28 VITALS — BP 130/84 | HR 77 | Resp 16 | Ht 65.0 in | Wt 234.2 lb

## 2024-01-28 DIAGNOSIS — I2510 Atherosclerotic heart disease of native coronary artery without angina pectoris: Secondary | ICD-10-CM

## 2024-01-28 DIAGNOSIS — I10 Essential (primary) hypertension: Secondary | ICD-10-CM | POA: Diagnosis not present

## 2024-01-28 DIAGNOSIS — E7800 Pure hypercholesterolemia, unspecified: Secondary | ICD-10-CM | POA: Diagnosis not present

## 2024-01-28 DIAGNOSIS — R079 Chest pain, unspecified: Secondary | ICD-10-CM

## 2024-01-28 DIAGNOSIS — G8929 Other chronic pain: Secondary | ICD-10-CM

## 2024-01-28 DIAGNOSIS — G4733 Obstructive sleep apnea (adult) (pediatric): Secondary | ICD-10-CM | POA: Diagnosis not present

## 2024-01-28 MED ORDER — VALSARTAN 320 MG PO TABS: 320.0000 mg | ORAL_TABLET | Freq: Every day | ORAL | Status: DC

## 2024-01-28 MED ORDER — VALSARTAN 320 MG PO TABS
320.0000 mg | ORAL_TABLET | Freq: Every day | ORAL | 3 refills | Status: AC
Start: 2024-01-28 — End: ?

## 2024-01-28 NOTE — Patient Instructions (Signed)
 Medication Instructions:  Stop Losartan   Start Valsartan 320 mg daily Continue all other medications *If you need a refill on your cardiac medications before your next appointment, please call your pharmacy*  Lab Work: None ordered  Testing/Procedures: None ordered  Follow-Up: At [facility], you and your health needs are our priority.  As part of our continuing mission to provide you with exceptional heart care, our providers are all part of one team.  This team includes your primary Cardiologist (physician) and Advanced Practice Providers or APPs (Physician Assistants and Nurse Practitioners) who all work together to provide you with the care you need, when you need it.  Your next appointment:  3 months     Provider:  Dr.Jordan          Schedule appointment with Pharmacist  [facility]      We recommend signing up for the patient portal called MyChart.  Sign up information is provided on this After Visit Summary.  MyChart is used to connect with patients for Virtual Visits (Telemedicine).  Patients are able to view lab/test results, encounter notes, upcoming appointments, etc.  Non-urgent messages can be sent to your provider as well.   To learn more about what you can do with MyChart, go to [email or web address].

## 2024-01-28 NOTE — Progress Notes (Signed)
 PATIENT: Abigail Mendez DOB: Aug 07, 1965  REASON FOR VISIT: follow up HISTORY FROM: patient PRIMARY NEUROLOGIST: Dr. Buck  Virtual Visit via Video Note  I connected with Abigail Mendez on 01/28/24 at  2:15 PM EST by a video enabled telemedicine application located remotely at [facility] and verified that I am speaking with the correct person using two identifiers who was located at their own home.  She verified that she is currently in [place]    I discussed the limitations of evaluation and management by telemedicine and the availability of in person appointments. The patient expressed understanding and agreed to proceed.   PATIENT: Abigail Mendez DOB: 05-20-1965  REASON FOR VISIT: follow up HISTORY FROM: patient  HISTORY OF PRESENT ILLNESS: Today 01/28/24:  Abigail Mendez is a 58 y.o. female with a history of obstructive sleep apnea on BiPAP. Returns today for follow-up.  Reports that CPAP is working well.  She currently has the nasal pillows.  Denies any new issues.  Her download is below     07/19/22: Abigail Mendez is a 58 y.o. female with a history of OSA on CPAP. Returns today for follow-up.  She reports that she still feels daytime sleepiness and fatigue.  She does have a diagnosis of chronic fatigue syndrome and fibromyalgia.  She is concerned because on her machine each night she may have 8 or 9 events an hour.  I did explain that the average has been 5 events an hour which is acceptable treatment.  Also explained that when she was on a set pressure her events were lower but she has preferred AutoSet.  Download is below       Abigail Mendez is a 58 year old female with a history of OSA on CPAP. She returns today for follow-up. Reports CPAP gets water in the mask. Has tried adjusting humidity. Download is below.     REVIEW OF SYSTEMS: Out of a complete 14 system review of symptoms, the patient complains only of the following symptoms, and  all other reviewed systems are negative.  ALLERGIES: Allergies  Allergen Reactions   Benztropine Mesylate     Other reaction(s): Unknown   Fluticasone -Salmeterol Nausea Only and Other (See Comments)   Lactose     Other reaction(s): Unknown   Tizanidine Hcl Other (See Comments)    Other reaction(s): Unknown   Amlodipine Swelling    Other reaction(s): Unknown   Atorvastatin    Benztropine Nausea And Vomiting   Cyclobenzaprine     Hallucinations   Gabapentin     Other reaction(s): Unknown   Lactose Intolerance (Gi) Diarrhea   Lamotrigine     'fall', balance problems   Latex     Redness,swelling,itching, localized pain   Lisinopril     cough   Pregabalin Other (See Comments)   Rosuvastatin    Topamax [Topiramate] Other (See Comments)    Burning in hands and feet   Tramadol Nausea And Vomiting   Trazodone Hcl Other (See Comments)    Other reaction(s): Unknown   Wound Dressing Adhesive    Phenylalanine Rash   Sarilumab Itching    HOME MEDICATIONS: Outpatient Medications Prior to Visit  Medication Sig Dispense Refill   acetaminophen  (TYLENOL ) 500 MG tablet Take 1,000 mg by mouth in the morning, at noon, and at bedtime.     albuterol  (VENTOLIN  HFA) 108 (90 Base) MCG/ACT inhaler INHALE 2 PUFFS INTO THE LUNGS EVERY 6 HOURS AS NEEDED FOR WHEEZING OR  SHORTNESS OF BREATH 6.7 g 2   ascorbic Acid (VITAMIN C) 500 MG CPCR Take by mouth.     aspirin  81 MG tablet Take 81 mg by mouth daily.     b complex vitamins tablet Take 1 tablet by mouth daily.     buprenorphine  (BUTRANS ) 20 MCG/HR PTWK Place 1 patch onto the skin once a week.     busPIRone  (BUSPAR ) 10 MG tablet Take 10 mg by mouth 2 (two) times daily.     CALCIUM PO Take by mouth.     carvedilol  (COREG ) 25 MG tablet Take 25 mg by mouth 2 (two) times daily.     Cranberry (CRANBERRY CONCENTRATE) 500 MG CAPS      cyanocobalamin 100 MCG tablet Take by mouth.     famotidine  (PEPCID ) 10 MG tablet Take 10 mg by mouth daily.      felodipine  (PLENDIL ) 5 MG 24 hr tablet Take 5 mg by mouth daily.     fluticasone  (FLONASE ) 50 MCG/ACT nasal spray USE 1 SPRAY NASALLY EVERY DAY     furosemide  (LASIX ) 20 MG tablet Take 20 mg by mouth 2 (two) times daily.     Garlic 1000 MG CAPS      Ginkgo Biloba 40 MG TABS Take 60 mg by mouth.     glucose blood test strip 1 each by Other route as needed for other. Use as instructed     hydrALAZINE  (APRESOLINE ) 25 MG tablet Take 25 mg by mouth 3 (three) times daily. If BP >125     HYDROcodone -acetaminophen  (NORCO/VICODIN) 5-325 MG tablet Take 1 tablet by mouth every 6 (six) hours as needed for moderate pain (pain score 4-6).     levothyroxine  (SYNTHROID , LEVOTHROID) 50 MCG tablet Take 50 mcg by mouth daily.     Magnesium (MAGNACAPS PO) Take by mouth.     methocarbamol  (ROBAXIN ) 500 MG tablet Take 500-1,000 mg by mouth 2 (two) times daily as needed for muscle spasms.     Multiple Vitamin (MULTIVITAMIN WITH MINERALS) TABS tablet Take 1 tablet by mouth daily.     potassium chloride (KLOR-CON M) 10 MEQ tablet Take 10 mEq by mouth daily.     Povidone (IVIZIA DRY EYES OP) Apply to eye.     prednisoLONE  acetate (PRED FORTE ) 1 % ophthalmic suspension Place 1 drop into both eyes 4 (four) times daily.     pyridOXINE  (VITAMIN B6) 25 MG tablet Take 1 tablet (25 mg total) by mouth daily. 30 tablet 0   Semaglutide (OZEMPIC, 0.25 OR 0.5 MG/DOSE, [place]) Inject 2 mg into the skin once a week.     triamcinolone  cream (KENALOG ) 0.1 % Apply 1 Application topically 2 (two) times daily.     valsartan (DIOVAN) 320 MG tablet Take 1 tablet (320 mg total) by mouth daily. 90 tablet 3   No facility-administered medications prior to visit.    PAST MEDICAL HISTORY: Past Medical History:  Diagnosis Date   Anemia    Anxiety    Asthma    CAD (coronary artery disease)    Chronic pain disorder    Chronic stable angina    Colitis    Daytime somnolence 01/07/2013   Depression    Diabetes mellitus, type II (HCC)     Fibromyalgia    GERD (gastroesophageal reflux disease)    Hypertension    Hypothyroidism    Left kidney mass    MDD (major depressive disorder) 01/07/2013   Obesity    OSA on CPAP 01/07/2013  Pinched nerve    PTSD (post-traumatic stress disorder)    Pulmonary scarring    Seasonal allergies    Sickle cell trait    Sleep apnea    Vaginitis     PAST SURGICAL HISTORY: Past Surgical History:  Procedure Laterality Date   EYE SURGERY Bilateral    04/21/23 and 05/09/23   HERNIA REPAIR  06/21/2023   LOOP RECORDER IMPLANT     NO PAST SURGERIES     RETINAL TEAR REPAIR CRYOTHERAPY Left 03/26/2022   WISDOM TOOTH EXTRACTION  1988    FAMILY HISTORY: Family History  Problem Relation Age of Onset   Osteoarthritis Mother    Asthma Mother    Hypertension Mother    Cancer Mother    High Cholesterol Mother    Breast cancer Sister 51   Bipolar disorder Sister    Suicidality Neg Hx    Sleep apnea Neg Hx     SOCIAL HISTORY: Social History   Socioeconomic History   Marital status: Single    Spouse name: Not on file   Number of children: Not on file   Years of education: Not on file   Highest education level: Not on file  Occupational History   Not on file  Tobacco Use   Smoking status: Never   Smokeless tobacco: Never  Vaping Use   Vaping status: Never Used  Substance and Sexual Activity   Alcohol  use: No   Drug use: No   Sexual activity: Never    Birth control/protection: Abstinence  Other Topics Concern   Not on file  Social History Narrative   Not on file   Social Drivers of Health   Financial Resource Strain: Low Risk  (04/29/2023)   Received from [facility]   Overall Financial Resource Strain (CARDIA)    Difficulty of Paying Living Expenses: Not hard at all  Recent Concern: Financial Resource Strain - Medium Risk (02/03/2023)   Received from [facility]   Overall Financial Resource Strain (CARDIA)    Difficulty of Paying Living Expenses: Somewhat hard   Food Insecurity: No Food Insecurity (04/29/2023)   Received from [facility]   Hunger Vital Sign    Within the past 12 months, you worried that your food would run out before you got the money to buy more.: Never true    Within the past 12 months, the food you bought just didn't last and you didn't have money to get more.: Never true  Recent Concern: Food Insecurity - Food Insecurity Present (02/03/2023)   Received from [facility]   Hunger Vital Sign    Worried About Running Out of Food in the Last Year: Sometimes true    Ran Out of Food in the Last Year: Never true  Transportation Needs: No Transportation Needs (04/29/2023)   Received from [facility] - Transportation    Lack of Transportation (Medical): No    Lack of Transportation (Non-Medical): No  Recent Concern: Transportation Needs - Unmet Transportation Needs (02/03/2023)   Received from [facility]   PRAPARE - Transportation    Lack of Transportation (Medical): Yes    Lack of Transportation (Non-Medical): Yes  Physical Activity: Unknown (02/03/2023)   Received from [facility]   Exercise Vital Sign    On average, how many days per week do you engage in moderate to strenuous exercise (like a brisk walk)?: 0 days    Minutes of Exercise per Session: Not on file  Stress: Stress Concern Present (02/03/2023)  Received from [facility] - Occupational Stress Questionnaire    Feeling of Stress : Very much  Social Connections: Somewhat Isolated (02/03/2023)   Received from [facility]   Social Network    How would you rate your social network (family, work, friends)?: Restricted participation with some degree of social isolation  Intimate Partner Violence: Not At Risk (02/03/2023)   Received from [facility]   HITS    Over the last 12 months how often did your partner physically hurt you?: Never    Over the last 12 months how often did your partner insult you  or talk down to you?: Never    Over the last 12 months how often did your partner threaten you with physical harm?: Never    Over the last 12 months how often did your partner scream or curse at you?: Never      PHYSICAL EXAM Generalized: Well developed, in no acute distress   Neurological examination  Mentation: Alert oriented to time, place, history taking. Follows all commands speech and language fluent Cranial nerve II-XII: Facial symmetry noted.   DIAGNOSTIC DATA (LABS, IMAGING, TESTING) - I reviewed patient records, labs, notes, testing and imaging myself where available.  Lab Results  Component Value Date   WBC 5.7 10/04/2023   HGB 11.2 (L) 10/04/2023   HCT 33.3 (L) 10/04/2023   MCV 82.2 10/04/2023   PLT 260 10/04/2023      Component Value Date/Time   NA 139 10/27/2023 1502   K 3.7 10/27/2023 1502   CL 102 10/27/2023 1502   CO2 27 10/27/2023 1502   GLUCOSE 81 10/27/2023 1502   BUN 10 10/27/2023 1502   CREATININE 1.18 (H) 10/27/2023 1502   CALCIUM 9.5 10/27/2023 1502   PROT 7.1 10/27/2023 1502   ALBUMIN 3.6 10/27/2023 1502   AST 22 10/27/2023 1502   ALT 17 10/27/2023 1502   ALKPHOS 74 10/27/2023 1502   BILITOT 0.9 10/27/2023 1502   GFRNONAA 53 (L) 10/27/2023 1502   GFRAA 41 (L) 08/22/2015 1900   Lab Results  Component Value Date   TSH 2.039 02/01/2010      ASSESSMENT AND PLAN 58 y.o. year old female  has a past medical history of Anemia, Anxiety, Asthma, CAD (coronary artery disease), Chronic pain disorder, Chronic stable angina, Colitis, Daytime somnolence (01/07/2013), Depression, Diabetes mellitus, type II (HCC), Fibromyalgia, GERD (gastroesophageal reflux disease), Hypertension, Hypothyroidism, Left kidney mass, MDD (major depressive disorder) (01/07/2013), Obesity, OSA on CPAP (01/07/2013), Pinched nerve, PTSD (post-traumatic stress disorder), Pulmonary scarring, Seasonal allergies, Sickle cell trait, Sleep apnea, and Vaginitis. here with:  OSA on  CPAP  CPAP compliance excellent Residual AHI is good Encouraged patient to continue using CPAP nightly and > 4 hours each night F/U in 1 year  Duwaine Russell, MSN, NP-C 01/28/2024, 2:26 PM [facility] [address] [place], [place] [address] [phone]  The patient's condition requires frequent monitoring and adjustments in the treatment plan, reflecting the ongoing complexity of care.  This provider is the continuing focal point for all needed services for this condition.

## 2024-01-28 NOTE — Progress Notes (Unsigned)
 Office Visit    Patient Name: Abigail Mendez Date of Encounter: 01/29/2024  Primary Care Provider:  Cecille Pellet, NP Primary Cardiologist:  None  Chief Complaint    Hypertension & Dyslipidemia (referral for Repatha or Nexlizet)  Significant Past Medical History   CAD Evidence of coronary calcification, mild nonobstructive CAD on 2024 CTA  T2DM Last A1c 5.1% (09/2023) - on Ozempic   CKD  Last eGFR 53 (10/2023)  OSA  On CPAP  Fibromyalgia Possible contributor to atypical chest pain    Allergies  Allergen Reactions   Benztropine Mesylate     Other reaction(s): Unknown   Fluticasone -Salmeterol Nausea Only and Other (See Comments)   Lactose     Other reaction(s): Unknown   Tizanidine Hcl Other (See Comments)    Other reaction(s): Unknown   Amlodipine Swelling    Other reaction(s): Unknown   Atorvastatin    Benztropine Nausea And Vomiting   Cyclobenzaprine     Hallucinations   Gabapentin     Other reaction(s): Unknown   Lactose Intolerance (Gi) Diarrhea   Lamotrigine     'fall', balance problems   Latex     Redness,swelling,itching, localized pain   Lisinopril     cough   Pregabalin Other (See Comments)   Rosuvastatin    Topamax [Topiramate] Other (See Comments)    Burning in hands and feet   Tramadol Nausea And Vomiting   Trazodone Hcl Other (See Comments)    Other reaction(s): Unknown   Wound Dressing Adhesive    Phenylalanine Rash   Sarilumab Itching    History of Present Illness    Abigail Mendez is a 58 y.o. female patient of Dr Peter Tavarious Freel, in the office today for hypertension evaluation.   Blood Pressure Goal:  130/80  Current BP Medications: Valsartan 320 mg daily, carvedilol  25 mg BID, felodipine  24-hr 5 mg daily, furosemide  20 mg BID.   At visit yesterday, patient was switched from losartan  to valsartan by Dr. Marlinda Miranda. Since patient receives medications in mail, has not made medication change yet. Discussed with patient the benefits of this  switch, and potential to use chlorthalidone for BP management if needed in future.   BP Medications Previously Tried: Amlodipine (swelling), lisinopril (cough)    Last lipid panel (11/2023): TC 181, 113, HDL 59, LDL 102  Prior lipid panel (09/2023): TC 146, HDL 40, LDL 94, TG 58   LDL Goal: < 70   Risk factors: Mild nonobstructive CAD, T2DM, HTN, Class 3 obesity  Patient not currently on any medications for lipid management, and experienced myalgias across entire body with use of statins. Discussed alternative medication options for lipid management, including ezetimibe, PCSK9 inhibitors, inclisiran, and bempedoic acid. Discussed mechanism of action, potential adverse effects, and availability through insurance.   Based on discussion, patient was most interested in starting inclisiran. Willing to consider PCSK9 inhibitor if unable to get inclisiran covered. Of note, patient reports family history of gout, making bempedoic acid less desirable option.   Previously Tried: Atorvastatin and rosuvastatin   Family Hx:    Family History  Problem Relation Age of Onset   Osteoarthritis Mother    Asthma Mother    Hypertension Mother    Cancer Mother    High Cholesterol Mother    Breast cancer Sister 59   Bipolar disorder Sister    Suicidality Neg Hx    Sleep apnea Neg Hx      Social Hx:     Tobacco: None.  Alcohol :  None.  Caffeine: Occasional chocolate. Has herbal tea occasionally, does not consume black licorice.  Diet: Vegetarian - fruits and vegetables. Does not eat out and avoids fried foods.   Exercise: Has had difficulty getting exercise routine since eye procedure in January. Gets up to 10k steps several times per week.   Home BP readings: 140s to 160s over upper 80s to 90s. Did not bring in BP readings since she brought them in with yesterdays visit.   Adherence Assessment  Do you ever forget to take your medication? [] Yes [x] No  Do you ever skip doses due to side  effects? [] Yes [x] No  Do you have trouble affording your medicines? [] Yes [x] No  Are you ever unable to pick up your medication due to transportation difficulties? [] Yes [x] No  Do you ever stop taking your medications because you don't believe they are helping? [] Yes [x] No  Do you check your weight daily? [] Yes [x] No   Adherence strategy: Has pill box with alarm on it. Occasionally has issues getting the alarm set.   Barriers to obtaining medications: None   Accessory Clinical Findings    Lab Results  Component Value Date   CREATININE 1.18 (H) 10/27/2023   BUN 10 10/27/2023   NA 139 10/27/2023   K 3.7 10/27/2023   CL 102 10/27/2023   CO2 27 10/27/2023   Lab Results  Component Value Date   ALT 17 10/27/2023   AST 22 10/27/2023   ALKPHOS 74 10/27/2023   BILITOT 0.9 10/27/2023   Lab Results  Component Value Date   HGBA1C 5.1 10/04/2023    Home Medications    Current Outpatient Medications  Medication Sig Dispense Refill   acetaminophen  (TYLENOL ) 500 MG tablet Take 1,000 mg by mouth in the morning, at noon, and at bedtime.     albuterol  (VENTOLIN  HFA) 108 (90 Base) MCG/ACT inhaler INHALE 2 PUFFS INTO THE LUNGS EVERY 6 HOURS AS NEEDED FOR WHEEZING OR SHORTNESS OF BREATH 6.7 g 2   ascorbic Acid (VITAMIN C) 500 MG CPCR Take by mouth.     aspirin  81 MG tablet Take 81 mg by mouth daily.     b complex vitamins tablet Take 1 tablet by mouth daily.     buprenorphine  (BUTRANS ) 20 MCG/HR PTWK Place 1 patch onto the skin once a week.     busPIRone  (BUSPAR ) 10 MG tablet Take 10 mg by mouth 2 (two) times daily.     CALCIUM PO Take by mouth.     carvedilol  (COREG ) 25 MG tablet Take 25 mg by mouth 2 (two) times daily.     Cranberry (CRANBERRY CONCENTRATE) 500 MG CAPS      cyanocobalamin 100 MCG tablet Take by mouth.     famotidine  (PEPCID ) 10 MG tablet Take 10 mg by mouth daily.     felodipine  (PLENDIL ) 5 MG 24 hr tablet Take 5 mg by mouth daily.     fluticasone  (FLONASE ) 50  MCG/ACT nasal spray USE 1 SPRAY NASALLY EVERY DAY     furosemide  (LASIX ) 20 MG tablet Take 20 mg by mouth 2 (two) times daily.     Garlic 1000 MG CAPS      Ginkgo Biloba 40 MG TABS Take 60 mg by mouth.     glucose blood test strip 1 each by Other route as needed for other. Use as instructed     hydrALAZINE  (APRESOLINE ) 25 MG tablet Take 25 mg by mouth 3 (three) times daily. If BP >125     HYDROcodone -acetaminophen  (NORCO/VICODIN)  5-325 MG tablet Take 1 tablet by mouth every 6 (six) hours as needed for moderate pain (pain score 4-6).     levothyroxine  (SYNTHROID , LEVOTHROID) 50 MCG tablet Take 50 mcg by mouth daily.     Magnesium (MAGNACAPS PO) Take by mouth.     methocarbamol  (ROBAXIN ) 500 MG tablet Take 500-1,000 mg by mouth 2 (two) times daily as needed for muscle spasms.     Multiple Vitamin (MULTIVITAMIN WITH MINERALS) TABS tablet Take 1 tablet by mouth daily.     potassium chloride (KLOR-CON M) 10 MEQ tablet Take 10 mEq by mouth daily.     Povidone (IVIZIA DRY EYES OP) Apply to eye.     prednisoLONE  acetate (PRED FORTE ) 1 % ophthalmic suspension Place 1 drop into both eyes 4 (four) times daily.     pyridOXINE  (VITAMIN B6) 25 MG tablet Take 1 tablet (25 mg total) by mouth daily. 30 tablet 0   Semaglutide (OZEMPIC, 0.25 OR 0.5 MG/DOSE, [place]) Inject 2 mg into the skin once a week.     triamcinolone  cream (KENALOG ) 0.1 % Apply 1 Application topically 2 (two) times daily.     valsartan (DIOVAN) 320 MG tablet Take 1 tablet (320 mg total) by mouth daily. 90 tablet 3   No current facility-administered medications for this visit.     HYPERTENSION CONTROL Vitals:   01/29/24 1023  BP: (!) 146/92    The patient's blood pressure is elevated above target today.  In order to address the patient's elevated BP: Blood pressure will be monitored at home to determine if medication changes need to be made.      Assessment & Plan    Essential hypertension Assessment: BP is uncontrolled in office  BP 146/92 mmHg above the goal (<130/80). Tolerates medications well without any side effects Reiterated the importance of regular exercise and low salt diet   Plan:  Continue taking valsartan 320 mg daily, carvedilol  25 mg BID, felodipine  24-hour 5 mg daily, furosemide  20 mg BID Patient to keep record of BP readings with heart rate and report to us  at the next visit Patient to see PharmD in 8 weeks for follow up  Follow up lab(s): None   Hyperlipidemia Assessment:  LDL goal: < 70 mg/dl last LDLc  897  mg/dl (90/7974) Intolerance to atorvastatin and rosuvastatin  Discussed next potential options (PCSK-9 inhibitors, bempedoic acid and inclisiran); cost, dosing efficacy, side effects   Plan: Will assess coverage for inclisiran for LDL lowering. If insurance does not cover, will investigate use of Repatha or Praluent.  Lipid lab due one month following second dose of inclisiran   Thank you,   Forrester Blando, PharmD candidate   I was with student and patient for entire visit and agree with above assessment and plan.   Kristin Alvstad PharmD CPP [facility]  [address] [place], [place] [address] [phone]

## 2024-01-28 NOTE — Patient Instructions (Signed)
 Continue using CPAP nightly and greater than 4 hours each night If your symptoms worsen or you develop new symptoms please let us  know.

## 2024-01-29 ENCOUNTER — Ambulatory Visit
Payer: Medicare (Managed Care) | Attending: Student in an Organized Health Care Education/Training Program | Admitting: Pharmacist Clinician (PhC)/ Clinical Pharmacy Specialist

## 2024-01-29 ENCOUNTER — Encounter: Payer: Self-pay | Admitting: Pharmacist Clinician (PhC)/ Clinical Pharmacy Specialist

## 2024-01-29 VITALS — BP 146/92 | HR 74

## 2024-01-29 DIAGNOSIS — E78019 Familial hypercholesterolemia, unspecified: Secondary | ICD-10-CM

## 2024-01-29 DIAGNOSIS — E785 Hyperlipidemia, unspecified: Secondary | ICD-10-CM | POA: Insufficient documentation

## 2024-01-29 DIAGNOSIS — I10 Essential (primary) hypertension: Secondary | ICD-10-CM | POA: Diagnosis not present

## 2024-01-29 NOTE — Patient Instructions (Addendum)
 FOLLOW UP:  January 5 at 8:15 am  CHOLESTEROL Your Results:             Your most recent labs Goal  Total Cholesterol 181 < 200  Triglycerides 113 < 150  HDL (happy/good cholesterol) 59 > 40  LDL (lousy/bad cholesterol 102 < 70   Medication changes:  We will start the process to get Leqvio covered by your insurance.  Once the prior authorization is complete, I will call/send a MyChart message to let you know and confirm pharmacy information.   You will take one injection every 3 months for 2 doses, then every 6 months thereafter  Lab orders:  We want to repeat labs about a month after second dose.  We will send you a lab order to remind you once we get closer to that time.    BLOOD PRESSURE  Take your BP meds as follows: continue with your current medications for now.   Check your blood pressure at home 3-4 days per week, and keep record of the readings.  Your blood pressure goal is < 130/80  To check your pressure at home you will need to:  1. Sit up in a chair, with feet flat on the floor and back supported. Do not cross your ankles or legs. 2. Rest your left arm so that the cuff is about heart level. If the cuff goes on your upper arm,  then just relax the arm on the table, arm of the chair or your lap. If you have a wrist cuff, we  suggest relaxing your wrist against your chest (think of it as Pledging the Flag with the  wrong arm).  3. Place the cuff snugly around your arm, about 1 inch above the crook of your elbow. The  cords should be inside the groove of your elbow.  4. Sit quietly, with the cuff in place, for about 5 minutes. After that 5 minutes press the power  button to start a reading. 5. Do not talk or move while the reading is taking place.  6. Record your readings on a sheet of paper. Although most cuffs have a memory, it is often  easier to see a pattern developing when the numbers are all in front of you.  7. You can repeat the reading after 1-3 minutes if  it is recommended  Make sure your bladder is empty and you have not had caffeine or tobacco within the last 30 min  Always bring your blood pressure log with you to your appointments. If you have not brought your monitor in to be double checked for accuracy, please bring it to your next appointment.  You can find a list of quality blood pressure cuffs at [email or web address].no  Important lifestyle changes to control high blood pressure  Intervention  Effect on the BP  Lose extra pounds and watch your waistline Weight loss is one of the most effective lifestyle changes for controlling blood pressure. If you're overweight or obese, losing even a small amount of weight can help reduce blood pressure. Blood pressure might go down by about 1 millimeter of mercury (mm Hg) with each kilogram (about 2.2 pounds) of weight lost.  Exercise regularly As a general goal, aim for at least 30 minutes of moderate physical activity every day. Regular physical activity can lower high blood pressure by about 5 to 8 mm Hg.  Eat a healthy diet Eating a diet rich in whole grains, fruits, vegetables, and low-fat dairy products and low  in saturated fat and cholesterol. A healthy diet can lower high blood pressure by up to 11 mm Hg.  Reduce salt (sodium) in your diet Even a small reduction of sodium in the diet can improve heart health and reduce high blood pressure by about 5 to 6 mm Hg.  Limit alcohol  One drink equals 12 ounces of beer, 5 ounces of wine, or 1.5 ounces of 80-proof liquor.  Limiting alcohol  to less than one drink a day for women or two drinks a day for men can help lower blood pressure by about 4 mm Hg.   If you have any questions or concerns please use My Chart to send questions or call the office at [phone]

## 2024-01-29 NOTE — Assessment & Plan Note (Addendum)
 Assessment: BP is uncontrolled in office BP 146/92 mmHg above the goal (<130/80). Tolerates medications well without any side effects Reiterated the importance of regular exercise and low salt diet   Plan:  Continue taking valsartan 320 mg daily, carvedilol  25 mg BID, felodipine  24-hour 5 mg daily, furosemide  20 mg BID Patient to keep record of BP readings with heart rate and report to us  at the next visit Patient to see PharmD in 8 weeks for follow up  Follow up lab(s): None

## 2024-01-29 NOTE — Assessment & Plan Note (Signed)
 Assessment:  LDL goal: < 70 mg/dl last LDLc  897  mg/dl (90/7974) Intolerance to atorvastatin and rosuvastatin  Discussed next potential options (PCSK-9 inhibitors, bempedoic acid and inclisiran); cost, dosing efficacy, side effects   Plan: Will assess coverage for inclisiran for LDL lowering. If insurance does not cover, will investigate use of Repatha or Praluent.  Lipid lab due one month following second dose of inclisiran

## 2024-02-12 ENCOUNTER — Other Ambulatory Visit: Payer: Self-pay | Admitting: Pharmacist Clinician (PhC)/ Clinical Pharmacy Specialist

## 2024-03-17 ENCOUNTER — Encounter: Payer: Self-pay | Admitting: Physician Assistant

## 2024-03-17 ENCOUNTER — Ambulatory Visit (INDEPENDENT_AMBULATORY_CARE_PROVIDER_SITE_OTHER): Payer: Medicare (Managed Care) | Admitting: Physician Assistant

## 2024-03-17 ENCOUNTER — Other Ambulatory Visit: Payer: Self-pay

## 2024-03-17 DIAGNOSIS — M19012 Primary osteoarthritis, left shoulder: Secondary | ICD-10-CM

## 2024-03-17 NOTE — Progress Notes (Signed)
 HPI: Abigail Mendez 58 year old female comes in today for left arm pain.  She has had left arm pain for years.  She notes decreased range of motion of the left arm due to the shoulder pain.  Shoulder pain awakens her at night.  Pain radiates down into the biceps region.  She also has carpal tunnel syndrome which she has been told is beyond surgical intervention as she has had it for some time severe bilaterally.  She has diabetic neuropathy in her hands also.  Patient is left-hand dominant.  She reports that she has had injections in the left shoulder under ultrasound therefore sounds as if these were intra-articular without any relief. Radiographs of her left shoulder dated 12/04/2023 are reviewed which show severe end-stage arthritis.  Shoulder is well located.  Moderate to severe AC joint changes. Patient's medical history is pertinent for type 2 diabetes which she reports is well-controlled, sleep apnea on CPAP, hypertension, hypothyroidism.  Review of systems see HPI otherwise negative  Physical exam: General well-developed well-nourished female.  No acute distress.  Ambulates with a slight antalgic gait and walker Bilateral hands: Radial pulses are 2+.  Subjective decrease sensation throughout both hands light touch. Bilateral shoulders good range of motion of the right shoulder without pain.  Left shoulder forward flexion actively to approximately 85 degrees abduction actively 60 degrees.  She has pain with any overhead activity above 85 degrees passively.  Left shoulder positive empty can test.  Liftoff test overall good strength bilaterally.  Impression: Left shoulder severe glenohumeral arthritis  Plan: Will have her undergo CT scan of the left shoulder and then follow-up with Dr. Dineen for possible shoulder replacement.  Questions were encouraged and answered at length today.

## 2024-03-23 ENCOUNTER — Ambulatory Visit: Payer: Medicare (Managed Care) | Admitting: Pharmacist Clinician (PhC)/ Clinical Pharmacy Specialist

## 2024-03-23 ENCOUNTER — Other Ambulatory Visit: Payer: Medicare (Managed Care)

## 2024-03-23 DIAGNOSIS — M19012 Primary osteoarthritis, left shoulder: Secondary | ICD-10-CM

## 2024-03-24 ENCOUNTER — Telehealth (HOSPITAL_COMMUNITY): Payer: Self-pay | Admitting: Pharmacist

## 2024-03-24 NOTE — Telephone Encounter (Signed)
 Leqvio treatment plan discontinue. Patient will be getting treated at [facility]  Stephanie Littman, PharmD, MPH, BCPS, CPP Clinical Pharmacist

## 2024-04-08 ENCOUNTER — Ambulatory Visit: Payer: Medicare (Managed Care) | Admitting: Orthopedic Surgery

## 2024-04-08 ENCOUNTER — Encounter: Payer: Self-pay | Admitting: Orthopedic Surgery

## 2024-04-08 DIAGNOSIS — M19012 Primary osteoarthritis, left shoulder: Secondary | ICD-10-CM | POA: Diagnosis not present

## 2024-04-08 NOTE — Progress Notes (Signed)
 "  Office Visit Note   Patient: Abigail Mendez           Date of Birth: 03/05/1966           MRN: [id number] Visit Date: 04/08/2024 Requested by: Cecille Pellet, NP [address] [place],  [place] [address] PCP: Cecille Pellet, NP  Subjective: Chief Complaint  Patient presents with   Left Shoulder - Pain    HPI: Abigail Mendez is a 59 y.o. female who presents to the office reporting left shoulder pain.  Been going on for 10 years.  Patient states she has a history of left shoulder rotator cuff tear on both the right and left shoulders.  She is left-hand dominant.  Has a history of injections which typically do not help.  Last 1 was March 2025.  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  Patient states she also has carpal tunnel syndrome as well as multiple trigger digits.  Notably she uses a pain patch as well as Norco 4/day.  Lives alone and will likely need skilled nursing for recovery.  Patient uses a walker for balance but not for weightbearing.  She does have neuropathy in her feet.  Patient states she also has an attendant at home.  Has a history of coronary artery disease..                ROS: All systems reviewed are negative as they relate to the chief complaint within the history of present illness.  Patient denies fevers or chills.  Assessment & Plan: Visit Diagnoses:  1. Arthritis of left shoulder     Plan: Impression is left shoulder arthritis.  Patient is in pain management.  CT scan is reviewed.  She does have end-stage arthritis with maintenance of glenoid vault volume.  Some deformity is present.  Plan at this time is left reverse shoulder replacement.  She is fairly low demand in terms of not doing too much physical activity.  Based on her pain medicine requirements I would favor 1 definitive surgery reverse shoulder replacement over total shoulder replacement in this patient with a history of rotator cuff pathology.  Also based on her pain medicine  requirements 1 operation to get over would be optimal.  The risk and benefits of this are discussed with the patient including not limited to infection or vessel damage incomplete functional restoration incomplete pain relief as well as instability.  The rigorous nature of the rehabilitative process is also discussed.  And involved in this case specifically pain control will definitely be an issue for her.  At this time what I envision is likely going from Norco 07/20/2023 4/day to [facility] 12/20/2023 4/day and maintaining her other pain medicine maintenance dosing.  Patient understands risk and benefits and wishes to proceed.  She will likely need skilled nursing based on her living situation.  Probably for about 1 week.  Follow-Up Instructions: No follow-ups on file.   Orders:  No orders of the defined types were placed in this encounter.  No orders of the defined types were placed in this encounter.     Procedures: No procedures performed   Clinical Data: No additional findings.  Objective: Vital Signs: LMP 03/25/2014   Physical Exam:  Constitutional: Patient appears well-developed HEENT:  Head: Normocephalic Eyes:EOM are normal Neck: Normal range of motion Cardiovascular: Normal rate Pulmonary/chest: Effort normal Neurologic: Patient is alert Skin: Skin is warm Psychiatric: Patient has normal mood and affect  Ortho Exam: Ortho exam demonstrates range of motion on the right of 60/110/165.  Range of motion on the left is 25/75/130.  Active forward flexion and active abduction on that left-hand side is below 90 degrees.  Deltoid fires.  Does have a little bit of weakness to external rotation on the left compared to the right.  Subscap strength bilaterally is intact.  No other masses lymphadenopathy or skin changes noted in that left shoulder girdle region.  No discrete AC joint tenderness is present.  Specialty Comments:  No specialty comments available.  Imaging: No results  found.   PMFS History: Patient Active Problem List   Diagnosis Date Noted   Hyperlipidemia 01/29/2024   Chest pain 10/04/2023   Mild persistent asthma without complication 12/06/2021   Abnormal CT scan, lung 12/06/2021   Carpal tunnel syndrome 12/04/2021   H/O multiple pulmonary nodules 12/04/2021   Kidney disease 12/04/2021   Ventral hernia without obstruction or gangrene 12/01/2021   Overactive bladder 04/27/2021   Class 3 severe obesity due to excess calories with serious comorbidity and body mass index (BMI) of 40.0 to 44.9 in adult (HCC) 05/03/2020   Anemia of chronic renal failure, stage 3a (HCC) 03/17/2020   Lymphocytosis 03/17/2020   Dyspnea on exertion 01/07/2020   Palpitations 01/07/2020   DM type 2 causing CKD stage 3 (HCC) 08/06/2019   Fatty (change of) liver, not elsewhere classified 04/07/2019   Glenohumeral arthritis, left 12/12/2018   Cervical cancer screening 09/02/2018   Neuropathy 04/14/2017   High risk medication use 09/06/2016   Dizziness 08/21/2016   Status post placement of implantable loop recorder 04/23/2016   Elevated LFTs 01/04/2016   Elevated serum creatinine 12/27/2015   Syncope and collapse 12/01/2015   Social anxiety disorder 03/23/2014   GAD (generalized anxiety disorder) 09/15/2013   Restless leg syndrome 09/03/2013   Chronic myofascial pain 05/22/2013   Fibromyalgia 02/06/2013   GERD (gastroesophageal reflux disease) 02/06/2013   Chronic fatigue 02/06/2013   Essential hypertension 02/06/2013   Insomnia 02/06/2013   Osteoarthritis 02/06/2013   OSA on CPAP 01/07/2013   MDD (major depressive disorder) 01/07/2013   PTSD (post-traumatic stress disorder) 01/07/2013   Daytime somnolence 01/07/2013   Mild episode of recurrent major depressive disorder 01/07/2013   Arthritis of both knees 12/25/2012   Pulmonary nodule 08/14/2012   Sickle cell trait 03/19/2012   Hypothyroid 11/23/2010   Low back pain, unspecified 02/06/2010   Carpal tunnel  syndrome on both sides 03/19/1982   Past Medical History:  Diagnosis Date   Anemia    Anxiety    Asthma    CAD (coronary artery disease)    Chronic pain disorder    Chronic stable angina    Colitis    Daytime somnolence 01/07/2013   Depression    Diabetes mellitus, type II (HCC)    Fibromyalgia    GERD (gastroesophageal reflux disease)    Hypertension    Hypothyroidism    Left kidney mass    MDD (major depressive disorder) 01/07/2013   Obesity    OSA on CPAP 01/07/2013   Pinched nerve    PTSD (post-traumatic stress disorder)    Pulmonary scarring    Seasonal allergies    Sickle cell trait    Sleep apnea    Vaginitis     Family History  Problem Relation Age of Onset   Osteoarthritis Mother    Asthma Mother    Hypertension Mother    Cancer Mother    High Cholesterol Mother  Breast cancer Sister 31   Bipolar disorder Sister    Suicidality Neg Hx    Sleep apnea Neg Hx     Past Surgical History:  Procedure Laterality Date   EYE SURGERY Bilateral    04/21/23 and 05/09/23   HERNIA REPAIR  06/21/2023   LOOP RECORDER IMPLANT     NO PAST SURGERIES     RETINAL TEAR REPAIR CRYOTHERAPY Left 03/26/2022   WISDOM TOOTH EXTRACTION  1988   Social History   Occupational History   Not on file  Tobacco Use   Smoking status: Never   Smokeless tobacco: Never  Vaping Use   Vaping status: Never Used  Substance and Sexual Activity   Alcohol  use: No   Drug use: No   Sexual activity: Never    Birth control/protection: Abstinence        "

## 2024-04-16 ENCOUNTER — Telehealth (HOSPITAL_BASED_OUTPATIENT_CLINIC_OR_DEPARTMENT_OTHER): Payer: Self-pay

## 2024-04-16 NOTE — Telephone Encounter (Signed)
"  ° °  Pre-operative Risk Assessment    Patient Name: Abigail Mendez  DOB: 09/22/1965 MRN: [id number]   Date of last office visit: 01/28/2024 with Dr. Jordan Date of next office visit: 04/27/2024 with Dr. Jordan  Request for Surgical Clearance    Procedure:  Left Reverse Shoulder Arthroplasty  Date of Surgery:  Clearance TBD       (March 2026)    Surgeon:  Glendia Hutchinson, MD Surgeon's Group or Practice Name:  [facility] at [facility]  Phone number:  [phone] Fax number:  [phone]   Type of Clearance Requested:   - Medical  - Pharmacy:  Hold Aspirin  -does not specify   Type of Anesthesia:  General    Additional requests/questions:  None  SignedPatrcia Iverson CROME   04/16/2024, 4:54 PM   "

## 2024-04-17 ENCOUNTER — Telehealth: Payer: Self-pay | Admitting: Orthopedic Surgery

## 2024-04-17 NOTE — Telephone Encounter (Signed)
 Called [facility] and left message on Jasmine's voicemail.  Patient is scheduled for left RSA with Dr. Addie 05-12-24 at [facility].  We are in need of an authorization for surgery.  Left my name and direct number for Jasmine to call me back.

## 2024-04-17 NOTE — Telephone Encounter (Signed)
 Okay thanks [employer]

## 2024-04-17 NOTE — Telephone Encounter (Signed)
 Will update all parties the pt has appt 04/27/24 Dr. Jordan. Appt notes have been reflected needing preop clearance.

## 2024-04-17 NOTE — Telephone Encounter (Signed)
 Preoperative team,  Patient has upcoming appointment with Dr. Jordan on 04/27/2024.  Please add preoperative cardiac evaluation to appointment notes.  Thank you for your help.  Josefa HERO. Morey Andonian NP-C     04/17/2024, 7:27 AM [facility] [address] [place], [place] [address] Office [phone]

## 2024-04-23 NOTE — Progress Notes (Unsigned)
 " Cardiology Office Note:    Date:  04/23/2024   ID:  Abigail Mendez, DOB 1965-10-27, MRN [id number]  PCP:  Cecille Pellet, NP   [facility] Providers Cardiologist:  None     Referring MD: Cecille Pellet, NP   No chief complaint on file.   History of Present Illness:    Abigail Mendez is a 59 y.o. female is seen at the request of Dr Cloria for evaluation of Chest pain. She has a history of DM type 2, asthma, HTN, CKD, OSA on CPAP, and sickle cell trait. She was admitted in July with some chest pain following a dental procedure. Ecg was normal. Troponin 34 and 28. Echo was unremarkable.  She has had prior CT of chest showing some coronary calcification.   In reviewing her records she has had extensive cardiac evaluation at [facility]. This includes multiple Echos- last in April 2024. Multiple nuclear stress tests - last in July 2025- all normal. Coronary CTA done Feb 2024 showed mild nonobstructive CAD. Cardiac cath done May 2024 showed 25% OM lesion otherwise normal.   She states she has chronic angina. Chest pain is pressure without relation to exertion or stress. Also has some intermittent SOB. Has not used Ntg. Prior CT negative for PE. She has difficult to control BP. Renal duplex negative. She is intolerant to statins. Reports a lot of swelling over the past 4 months. Home BP readings typically around 140 but may go up to 150-160. Her activity is limited due to fibromyalgia, RA and chronic fatigue. Currently doing PT/OT.    Past Medical History:  Diagnosis Date   Anemia    Anxiety    Asthma    CAD (coronary artery disease)    Chronic pain disorder    Chronic stable angina    Colitis    Daytime somnolence 01/07/2013   Depression    Diabetes mellitus, type II (HCC)    Fibromyalgia    GERD (gastroesophageal reflux disease)    Hypertension    Hypothyroidism    Left kidney mass    MDD (major depressive disorder) 01/07/2013   Obesity    OSA on CPAP 01/07/2013    Pinched nerve    PTSD (post-traumatic stress disorder)    Pulmonary scarring    Seasonal allergies    Sickle cell trait    Sleep apnea    Vaginitis     Past Surgical History:  Procedure Laterality Date   EYE SURGERY Bilateral    04/21/23 and 05/09/23   HERNIA REPAIR  06/21/2023   LOOP RECORDER IMPLANT     NO PAST SURGERIES     RETINAL TEAR REPAIR CRYOTHERAPY Left 03/26/2022   WISDOM TOOTH EXTRACTION  1988    Current Medications: No outpatient medications have been marked as taking for the 04/27/24 encounter (Appointment) with Gurleen Larrivee M, MD.     Allergies:   Benztropine mesylate, Fluticasone -salmeterol, Lactose, Tizanidine hcl, Amlodipine, Atorvastatin, Benztropine, Cyclobenzaprine, Gabapentin, Lactose intolerance (gi), Lamotrigine, Latex, Lisinopril, Pregabalin, Rosuvastatin, Topamax [topiramate], Tramadol, Trazodone hcl, Wound dressing adhesive, Phenylalanine, and Sarilumab   Social History   Socioeconomic History   Marital status: Single    Spouse name: Not on file   Number of children: Not on file   Years of education: Not on file   Highest education level: Master's degree (e.g., MA, MS, MEng, MEd, MSW, MBA)  Occupational History   Not on file  Tobacco Use   Smoking status: Never   Smokeless tobacco: Never  Vaping Use   Vaping status: Never Used  Substance and Sexual Activity   Alcohol  use: No   Drug use: No   Sexual activity: Never    Birth control/protection: Abstinence  Other Topics Concern   Not on file  Social History Narrative   Not on file   Social Drivers of Health   Tobacco Use: Low Risk (04/08/2024)   Patient History    Smoking Tobacco Use: Never    Smokeless Tobacco Use: Never    Passive Exposure: Not on file  Financial Resource Strain: Low Risk (04/23/2024)   Overall Financial Resource Strain (CARDIA)    Difficulty of Paying Living Expenses: Not very hard  Food Insecurity: No Food Insecurity (04/23/2024)   Epic    Worried About Radiation Protection Practitioner of  Food in the Last Year: Never true    Ran Out of Food in the Last Year: Never true  Transportation Needs: No Transportation Needs (04/23/2024)   Epic    Lack of Transportation (Medical): No    Lack of Transportation (Non-Medical): No  Physical Activity: Inactive (04/23/2024)   Exercise Vital Sign    Days of Exercise per Week: 0 days    Minutes of Exercise per Session: Not on file  Stress: No Stress Concern Present (04/23/2024)   [employer] of Occupational Health - Occupational Stress Questionnaire    Feeling of Stress: Only a little  Social Connections: Moderately Integrated (04/23/2024)   Social Connection and Isolation Panel    Frequency of Communication with Friends and Family: More than three times a week    Frequency of Social Gatherings with Friends and Family: Patient declined    Attends Religious Services: More than 4 times per year    Active Member of [employer] or Organizations: Yes    Attends Banker Meetings: More than 4 times per year    Marital Status: Never married  Depression (PHQ2-9): Not on file  Alcohol  Screen: Not on file  Housing: High Risk (04/23/2024)   Epic    Unable to Pay for Housing in the Last Year: Yes    Number of Times Moved in the Last Year: 0    Homeless in the Last Year: No  Utilities: Not At Risk (04/29/2023)   Received from [facility] Utilities    Threatened with loss of utilities: No  Health Literacy: Not on file     Family History: The patient's family history includes Asthma in her mother; Bipolar disorder in her sister; Breast cancer (age of onset: 59) in her sister; Cancer in her mother; High Cholesterol in her mother; Hypertension in her mother; Osteoarthritis in her mother. There is no history of Suicidality or Sleep apnea.  ROS:   Please see the history of present illness.     All other systems reviewed and are negative.  EKGs/Labs/Other Studies Reviewed:    The following studies were reviewed today: Echo 10/05/23:  IMPRESSIONS     1. Left ventricular ejection fraction, by estimation, is 55 to 60%. The  left ventricle has normal function. The left ventricle has no regional  wall motion abnormalities. Left ventricular diastolic parameters were  normal.   2. Right ventricular systolic function is normal. The right ventricular  size is normal. Tricuspid regurgitation signal is inadequate for assessing  PA pressure.   3. The mitral valve is normal in structure. Mild to moderate mitral valve  regurgitation. No evidence of mitral stenosis.   4. The aortic valve is tricuspid. Aortic valve  regurgitation is moderate.  No aortic stenosis is present.   5. The inferior vena cava is normal in size with greater than 50%  respiratory variability, suggesting right atrial pressure of 3 mmHg.        Recent Labs: 10/04/2023: Hemoglobin 11.2; Platelets 260 10/27/2023: ALT 17; BUN 10; Creatinine, Ser 1.18; Potassium 3.7; Sodium 139  Recent Lipid Panel    Component Value Date/Time   CHOL 146 10/05/2023 0236   TRIG 58 10/05/2023 0236   HDL 40 (L) 10/05/2023 0236   CHOLHDL 3.7 10/05/2023 0236   VLDL 12 10/05/2023 0236   LDLCALC 94 10/05/2023 0236     Risk Assessment/Calculations:      No BP recorded.  {Refresh Note OR Click here to enter BP  :1}***         Physical Exam:    VS:  LMP 03/25/2014     Wt Readings from Last 3 Encounters:  01/28/24 234 lb 3.2 oz (106.2 kg)  10/05/23 213 lb 13.5 oz (97 kg)  06/08/23 219 lb (99.3 kg)     GEN:  Well nourished, obese in no acute distress HEENT: Normal NECK: No JVD; No carotid bruits LYMPHATICS: No lymphadenopathy CARDIAC: RRR, no murmurs, rubs, gallops RESPIRATORY:  Clear to auscultation without rales, wheezing or rhonchi  ABDOMEN: Soft, non-tender, non-distended MUSCULOSKELETAL:  No edema; No deformity  SKIN: Warm and dry NEUROLOGIC:  Alert and oriented x 3 PSYCHIATRIC:  Normal affect   ASSESSMENT:    No diagnosis found.  PLAN:    In order of  problems listed above:  Chronic chest pain. Given extensive cardiac evaluation suspect this is more related to her fibromyalgia. She could have microvascular angina but symptoms are atypical. No additional cardiac evaluation needed HTN. Poorly controlled. Rneal duplex normal. Recommend switching losartan  to valsartan  320 mg daily. Continue Coreg , felodipine  and lasix . Low sodium diet. Will arrange follow up with Pharm D in one month.  HLD. Goal LDL < 70. Statin intolerant. Will follow up with Pharm D to consider Nexlizet vs Repatha CKD stage 3a            Medication Adjustments/Labs and Tests Ordered: Current medicines are reviewed at length with the patient today.  Concerns regarding medicines are outlined above.  No orders of the defined types were placed in this encounter.  No orders of the defined types were placed in this encounter.   There are no Patient Instructions on file for this visit.   Signed, Vianey Caniglia, MD  04/23/2024 12:36 PM    [facility]  "

## 2024-04-27 ENCOUNTER — Ambulatory Visit: Payer: Medicare (Managed Care) | Admitting: Cardiology

## 2024-05-27 ENCOUNTER — Encounter: Payer: Medicare (Managed Care) | Admitting: Orthopedic Surgery

## 2025-01-26 ENCOUNTER — Telehealth: Admitting: Adult Health
